# Patient Record
Sex: Female | Born: 1955 | Race: White | Hispanic: No | Smoking: Current every day smoker
Health system: Southern US, Community
[De-identification: ages and names within clinical notes are randomized; demographics above are authoritative.]

## PROBLEM LIST (undated history)

## (undated) DIAGNOSIS — E785 Hyperlipidemia, unspecified: Secondary | ICD-10-CM

## (undated) DIAGNOSIS — K219 Gastro-esophageal reflux disease without esophagitis: Secondary | ICD-10-CM

## (undated) DIAGNOSIS — N189 Chronic kidney disease, unspecified: Secondary | ICD-10-CM

## (undated) DIAGNOSIS — R519 Headache, unspecified: Secondary | ICD-10-CM

## (undated) DIAGNOSIS — T4145XA Adverse effect of unspecified anesthetic, initial encounter: Secondary | ICD-10-CM

## (undated) DIAGNOSIS — T8859XA Other complications of anesthesia, initial encounter: Secondary | ICD-10-CM

## (undated) DIAGNOSIS — I209 Angina pectoris, unspecified: Secondary | ICD-10-CM

## (undated) DIAGNOSIS — Z87442 Personal history of urinary calculi: Secondary | ICD-10-CM

## (undated) DIAGNOSIS — D649 Anemia, unspecified: Secondary | ICD-10-CM

## (undated) DIAGNOSIS — F32A Depression, unspecified: Secondary | ICD-10-CM

## (undated) DIAGNOSIS — R011 Cardiac murmur, unspecified: Secondary | ICD-10-CM

## (undated) DIAGNOSIS — J40 Bronchitis, not specified as acute or chronic: Secondary | ICD-10-CM

## (undated) DIAGNOSIS — R0600 Dyspnea, unspecified: Secondary | ICD-10-CM

## (undated) DIAGNOSIS — G4730 Sleep apnea, unspecified: Secondary | ICD-10-CM

## (undated) DIAGNOSIS — J449 Chronic obstructive pulmonary disease, unspecified: Secondary | ICD-10-CM

## (undated) DIAGNOSIS — D369 Benign neoplasm, unspecified site: Secondary | ICD-10-CM

## (undated) DIAGNOSIS — I499 Cardiac arrhythmia, unspecified: Secondary | ICD-10-CM

## (undated) DIAGNOSIS — G839 Paralytic syndrome, unspecified: Secondary | ICD-10-CM

## (undated) DIAGNOSIS — F419 Anxiety disorder, unspecified: Secondary | ICD-10-CM

## (undated) DIAGNOSIS — M797 Fibromyalgia: Secondary | ICD-10-CM

## (undated) DIAGNOSIS — M1990 Unspecified osteoarthritis, unspecified site: Secondary | ICD-10-CM

## (undated) DIAGNOSIS — M8580 Other specified disorders of bone density and structure, unspecified site: Secondary | ICD-10-CM

## (undated) DIAGNOSIS — M810 Age-related osteoporosis without current pathological fracture: Secondary | ICD-10-CM

## (undated) DIAGNOSIS — K2270 Barrett's esophagus without dysplasia: Secondary | ICD-10-CM

## (undated) DIAGNOSIS — E039 Hypothyroidism, unspecified: Secondary | ICD-10-CM

## (undated) DIAGNOSIS — I10 Essential (primary) hypertension: Secondary | ICD-10-CM

## (undated) DIAGNOSIS — J189 Pneumonia, unspecified organism: Secondary | ICD-10-CM

## (undated) HISTORY — DX: Hyperlipidemia, unspecified: E78.5

## (undated) HISTORY — DX: Other specified disorders of bone density and structure, unspecified site: M85.80

## (undated) HISTORY — DX: Morbid (severe) obesity due to excess calories: E66.01

## (undated) HISTORY — PX: KNEE ARTHROPLASTY: SHX992

## (undated) HISTORY — PX: NASAL SINUS SURGERY: SHX719

## (undated) HISTORY — PX: ESOPHAGOGASTRODUODENOSCOPY: SHX1529

## (undated) HISTORY — PX: TRANSTHORACIC ECHOCARDIOGRAM: SHX275

## (undated) HISTORY — PX: THYROIDECTOMY: SHX17

## (undated) HISTORY — PX: JOINT REPLACEMENT: SHX530

## (undated) HISTORY — PX: BACK SURGERY: SHX140

## (undated) HISTORY — PX: TONSILLECTOMY: SUR1361

## (undated) HISTORY — PX: COLONOSCOPY: SHX174

## (undated) HISTORY — PX: LAPAROSCOPIC ROUX-EN-Y GASTRIC BYPASS WITH UPPER ENDOSCOPY AND REMOVAL OF LAP BAND: SHX6505

## (undated) HISTORY — PX: DILATION AND CURETTAGE OF UTERUS: SHX78

## (undated) HISTORY — PX: ROTATOR CUFF REPAIR: SHX139

## (undated) HISTORY — DX: Age-related osteoporosis without current pathological fracture: M81.0

---

## 1997-10-18 ENCOUNTER — Other Ambulatory Visit: Admission: RE | Admit: 1997-10-18 | Discharge: 1997-10-18 | Payer: Self-pay | Admitting: Obstetrics and Gynecology

## 1998-11-27 ENCOUNTER — Inpatient Hospital Stay (HOSPITAL_COMMUNITY): Admission: AD | Admit: 1998-11-27 | Discharge: 1998-11-27 | Payer: Self-pay | Admitting: Obstetrics and Gynecology

## 1999-01-17 ENCOUNTER — Observation Stay (HOSPITAL_COMMUNITY): Admission: AD | Admit: 1999-01-17 | Discharge: 1999-01-18 | Payer: Self-pay | Admitting: Obstetrics and Gynecology

## 1999-01-27 ENCOUNTER — Inpatient Hospital Stay (HOSPITAL_COMMUNITY): Admission: AD | Admit: 1999-01-27 | Discharge: 1999-01-30 | Payer: Self-pay | Admitting: Obstetrics & Gynecology

## 1999-02-21 ENCOUNTER — Other Ambulatory Visit: Admission: RE | Admit: 1999-02-21 | Discharge: 1999-02-21 | Payer: Self-pay | Admitting: Obstetrics and Gynecology

## 2000-01-30 ENCOUNTER — Other Ambulatory Visit: Admission: RE | Admit: 2000-01-30 | Discharge: 2000-01-30 | Payer: Self-pay | Admitting: Obstetrics and Gynecology

## 2000-03-29 ENCOUNTER — Encounter: Admission: RE | Admit: 2000-03-29 | Discharge: 2000-03-29 | Payer: Self-pay | Admitting: Orthopedic Surgery

## 2000-03-29 ENCOUNTER — Encounter: Payer: Self-pay | Admitting: Orthopedic Surgery

## 2001-02-06 ENCOUNTER — Other Ambulatory Visit: Admission: RE | Admit: 2001-02-06 | Discharge: 2001-02-06 | Payer: Self-pay | Admitting: Obstetrics and Gynecology

## 2001-03-09 ENCOUNTER — Encounter: Payer: Self-pay | Admitting: Emergency Medicine

## 2001-03-09 ENCOUNTER — Emergency Department (HOSPITAL_COMMUNITY): Admission: EM | Admit: 2001-03-09 | Discharge: 2001-03-09 | Payer: Self-pay | Admitting: Emergency Medicine

## 2002-02-10 ENCOUNTER — Other Ambulatory Visit: Admission: RE | Admit: 2002-02-10 | Discharge: 2002-02-10 | Payer: Self-pay | Admitting: Obstetrics and Gynecology

## 2003-04-17 ENCOUNTER — Ambulatory Visit (HOSPITAL_BASED_OUTPATIENT_CLINIC_OR_DEPARTMENT_OTHER): Admission: RE | Admit: 2003-04-17 | Discharge: 2003-04-17 | Payer: Self-pay | Admitting: Emergency Medicine

## 2003-04-30 ENCOUNTER — Other Ambulatory Visit: Admission: RE | Admit: 2003-04-30 | Discharge: 2003-04-30 | Payer: Self-pay | Admitting: Obstetrics and Gynecology

## 2003-09-08 ENCOUNTER — Encounter (INDEPENDENT_AMBULATORY_CARE_PROVIDER_SITE_OTHER): Payer: Self-pay | Admitting: *Deleted

## 2003-09-08 ENCOUNTER — Ambulatory Visit (HOSPITAL_COMMUNITY): Admission: RE | Admit: 2003-09-08 | Discharge: 2003-09-08 | Payer: Self-pay | Admitting: *Deleted

## 2003-09-16 DIAGNOSIS — B0229 Other postherpetic nervous system involvement: Secondary | ICD-10-CM | POA: Insufficient documentation

## 2003-11-08 ENCOUNTER — Inpatient Hospital Stay (HOSPITAL_COMMUNITY): Admission: RE | Admit: 2003-11-08 | Discharge: 2003-11-11 | Payer: Self-pay | Admitting: Orthopedic Surgery

## 2004-10-23 ENCOUNTER — Encounter: Admission: RE | Admit: 2004-10-23 | Discharge: 2004-10-23 | Payer: Self-pay | Admitting: Neurosurgery

## 2004-11-06 ENCOUNTER — Encounter: Admission: RE | Admit: 2004-11-06 | Discharge: 2004-11-06 | Payer: Self-pay | Admitting: Neurosurgery

## 2005-05-23 ENCOUNTER — Ambulatory Visit: Payer: Self-pay | Admitting: Licensed Clinical Social Worker

## 2005-05-30 ENCOUNTER — Ambulatory Visit: Payer: Self-pay | Admitting: Licensed Clinical Social Worker

## 2005-06-06 ENCOUNTER — Ambulatory Visit: Payer: Self-pay | Admitting: Licensed Clinical Social Worker

## 2005-06-19 ENCOUNTER — Ambulatory Visit: Payer: Self-pay | Admitting: Licensed Clinical Social Worker

## 2005-06-27 ENCOUNTER — Ambulatory Visit: Payer: Self-pay | Admitting: Licensed Clinical Social Worker

## 2005-07-04 ENCOUNTER — Ambulatory Visit: Payer: Self-pay | Admitting: Licensed Clinical Social Worker

## 2005-07-11 ENCOUNTER — Ambulatory Visit: Payer: Self-pay | Admitting: Licensed Clinical Social Worker

## 2005-07-18 ENCOUNTER — Ambulatory Visit: Payer: Self-pay | Admitting: Licensed Clinical Social Worker

## 2005-07-25 ENCOUNTER — Ambulatory Visit: Payer: Self-pay | Admitting: Licensed Clinical Social Worker

## 2005-08-01 ENCOUNTER — Ambulatory Visit: Payer: Self-pay | Admitting: Licensed Clinical Social Worker

## 2005-08-08 ENCOUNTER — Ambulatory Visit: Payer: Self-pay | Admitting: Licensed Clinical Social Worker

## 2005-08-22 ENCOUNTER — Ambulatory Visit: Payer: Self-pay | Admitting: Licensed Clinical Social Worker

## 2006-06-26 ENCOUNTER — Encounter: Admission: RE | Admit: 2006-06-26 | Discharge: 2006-06-26 | Payer: Self-pay | Admitting: Endocrinology

## 2006-07-10 ENCOUNTER — Encounter (INDEPENDENT_AMBULATORY_CARE_PROVIDER_SITE_OTHER): Payer: Self-pay | Admitting: Specialist

## 2006-07-10 ENCOUNTER — Encounter: Admission: RE | Admit: 2006-07-10 | Discharge: 2006-07-10 | Payer: Self-pay | Admitting: Otolaryngology

## 2006-07-10 ENCOUNTER — Other Ambulatory Visit: Admission: RE | Admit: 2006-07-10 | Discharge: 2006-07-10 | Payer: Self-pay | Admitting: Interventional Radiology

## 2006-09-19 ENCOUNTER — Encounter (INDEPENDENT_AMBULATORY_CARE_PROVIDER_SITE_OTHER): Payer: Self-pay | Admitting: Specialist

## 2006-09-19 ENCOUNTER — Ambulatory Visit (HOSPITAL_COMMUNITY): Admission: RE | Admit: 2006-09-19 | Discharge: 2006-09-20 | Payer: Self-pay | Admitting: Otolaryngology

## 2006-12-28 ENCOUNTER — Emergency Department (HOSPITAL_COMMUNITY): Admission: EM | Admit: 2006-12-28 | Discharge: 2006-12-28 | Payer: Self-pay | Admitting: Emergency Medicine

## 2007-03-27 ENCOUNTER — Encounter: Admission: RE | Admit: 2007-03-27 | Discharge: 2007-03-27 | Payer: Self-pay | Admitting: Obstetrics and Gynecology

## 2007-04-01 ENCOUNTER — Encounter: Admission: RE | Admit: 2007-04-01 | Discharge: 2007-04-01 | Payer: Self-pay | Admitting: Interventional Radiology

## 2007-04-15 HISTORY — PX: CARDIOVASCULAR STRESS TEST: SHX262

## 2007-04-17 ENCOUNTER — Ambulatory Visit (HOSPITAL_COMMUNITY): Admission: RE | Admit: 2007-04-17 | Discharge: 2007-04-18 | Payer: Self-pay | Admitting: Interventional Radiology

## 2007-04-20 ENCOUNTER — Encounter: Payer: Self-pay | Admitting: Emergency Medicine

## 2007-05-13 ENCOUNTER — Encounter: Admission: RE | Admit: 2007-05-13 | Discharge: 2007-05-13 | Payer: Self-pay | Admitting: Interventional Radiology

## 2007-11-03 ENCOUNTER — Inpatient Hospital Stay (HOSPITAL_COMMUNITY): Admission: RE | Admit: 2007-11-03 | Discharge: 2007-11-06 | Payer: Self-pay | Admitting: Neurosurgery

## 2009-02-18 ENCOUNTER — Encounter: Admission: RE | Admit: 2009-02-18 | Discharge: 2009-02-18 | Payer: Self-pay | Admitting: Emergency Medicine

## 2009-02-19 ENCOUNTER — Ambulatory Visit (HOSPITAL_COMMUNITY): Admission: RE | Admit: 2009-02-19 | Discharge: 2009-02-19 | Payer: Self-pay | Admitting: Emergency Medicine

## 2009-05-02 ENCOUNTER — Encounter: Admission: RE | Admit: 2009-05-02 | Discharge: 2009-05-02 | Payer: Self-pay | Admitting: Gastroenterology

## 2010-03-05 ENCOUNTER — Ambulatory Visit: Payer: Self-pay | Admitting: Diagnostic Radiology

## 2010-03-05 ENCOUNTER — Ambulatory Visit (HOSPITAL_BASED_OUTPATIENT_CLINIC_OR_DEPARTMENT_OTHER): Admission: RE | Admit: 2010-03-05 | Discharge: 2010-03-05 | Payer: Self-pay | Admitting: Emergency Medicine

## 2010-04-18 DIAGNOSIS — I209 Angina pectoris, unspecified: Secondary | ICD-10-CM

## 2010-04-18 HISTORY — DX: Angina pectoris, unspecified: I20.9

## 2010-05-02 ENCOUNTER — Observation Stay (HOSPITAL_COMMUNITY)
Admission: EM | Admit: 2010-05-02 | Discharge: 2010-05-03 | Payer: Self-pay | Source: Home / Self Care | Admitting: Emergency Medicine

## 2010-05-03 HISTORY — PX: CARDIAC CATHETERIZATION: SHX172

## 2010-07-08 ENCOUNTER — Encounter: Payer: Self-pay | Admitting: Urology

## 2010-07-09 ENCOUNTER — Encounter: Payer: Self-pay | Admitting: Interventional Radiology

## 2010-07-09 ENCOUNTER — Encounter: Payer: Self-pay | Admitting: Emergency Medicine

## 2010-07-10 ENCOUNTER — Encounter: Payer: Self-pay | Admitting: Emergency Medicine

## 2010-08-29 LAB — CARDIAC PANEL(CRET KIN+CKTOT+MB+TROPI)
CK, MB: 1 ng/mL (ref 0.3–4.0)
Relative Index: INVALID (ref 0.0–2.5)
Total CK: 92 U/L (ref 7–177)
Troponin I: <0.01 ng/mL (ref 0.00–0.06)

## 2010-08-29 LAB — POCT CARDIAC MARKERS
CKMB, poc: <1 ng/mL — ABNORMAL LOW (ref 1.0–8.0)
Myoglobin, poc: 46.5 ng/mL (ref 12–200)
Myoglobin, poc: 48.4 ng/mL (ref 12–200)

## 2010-08-29 LAB — DIFFERENTIAL
Basophils Absolute: 0 10*3/uL (ref 0.0–0.1)
Basophils Relative: 0 % (ref 0–1)
Lymphocytes Relative: 26 % (ref 12–46)
Monocytes Absolute: 0.6 10*3/uL (ref 0.1–1.0)
Neutro Abs: 6.9 10*3/uL (ref 1.7–7.7)

## 2010-08-29 LAB — BRAIN NATRIURETIC PEPTIDE: Pro B Natriuretic peptide (BNP): <30 pg/mL (ref 0.0–100.0)

## 2010-08-29 LAB — TSH: TSH: 1.716 u[IU]/mL (ref 0.350–4.500)

## 2010-08-29 LAB — CBC
Hemoglobin: 12.9 g/dL (ref 12.0–15.0)
MCHC: 32.4 g/dL (ref 30.0–36.0)
Platelets: 351 10*3/uL (ref 150–400)
RBC: 4.61 MIL/uL (ref 3.87–5.11)
RDW: 14.5 % (ref 11.5–15.5)

## 2010-08-29 LAB — BASIC METABOLIC PANEL
Chloride: 99 mEq/L (ref 96–112)
Creatinine, Ser: 1.03 mg/dL (ref 0.4–1.2)
GFR calc Af Amer: >60 mL/min (ref >60)
GFR calc non Af Amer: 56 mL/min — ABNORMAL LOW (ref >60)
Potassium: 3.5 mEq/L (ref 3.5–5.1)
Sodium: 137 mEq/L (ref 135–145)

## 2010-08-29 LAB — HEMOGLOBIN A1C
Hgb A1c MFr Bld: 6.2 % — ABNORMAL HIGH (ref <5.7)
Mean Plasma Glucose: 131 mg/dL — ABNORMAL HIGH (ref <117)

## 2010-08-29 LAB — CK TOTAL AND CKMB (NOT AT ARMC): Total CK: 103 U/L (ref 7–177)

## 2010-08-29 LAB — HEPARIN LEVEL (UNFRACTIONATED): Heparin Unfractionated: <0.1 IU/mL — ABNORMAL LOW (ref 0.30–0.70)

## 2010-10-31 NOTE — Op Note (Signed)
NAME:  Michaela Raymond, Michaela Raymond NO.:  [id number]   MEDICAL RECORD NO.:  [id number]          PATIENT TYPE:  INP   LOCATION:  3008                         FACILITY:  [facility]   PHYSICIAN:  Cristi Loron, M.D.DATE OF BIRTH:  [birth year]   DATE OF PROCEDURE:  11/03/2007  DATE OF DISCHARGE:                               OPERATIVE REPORT   BRIEF HISTORY:  The patient is a 55 year old white female who has  suffered from back and leg pain.  She failed medical management worked  up with a lumbar MRI and x-rays, which demonstrated the patient has  spondylolisthesis with facet arthropathies ,ddd at L4-L5 and L5-S1.  She  failed extensive nonsurgical management.  I discussed various treatment  options with her including the surgery.  The patient has weighed the  risks, benefits, and alternatives of surgery, and decided to proceed  with a L4-L5, L5-S1 decompression, instrumentation, and fusion.   PREOPERATIVE DIAGNOSES:  1. L4-L5 and L5-S1 disk degeneration.  2. Facet arthropathy.  3. spondylolisthesis.  4. Lumbago.  5. Lumbar radiculopathy/myelopathy.   POSTOPERATIVE DIAGNOSES:  1. L4-L5 and L5-S1 disk degeneration.  2. Facet arthropathy.  3. spondylolisthesis.  4. Lumbago.  5. Lumbar radiculopathy/myelopathy.   PROCEDURE:  Bilateral L4-L5 laminotomies and foraminotomies to  decompress the bilateral L4-L5 and S1 nerve roots; L4-L5 and L5-S1  transforaminal lumbar interbody fusion with local morselized autograft  bone, Actifuse, and Vitoss bone graft extender; insertion of L4-L5 and  L5-S1 interbody prosthesis (Capstone PEEK interbody prosthesis);  posterior segmental instrumentation at L4-S1 with Legacy titanium  pedicle screws and rods; posterolateral arthrodesis at L4-L5 and L5-S1  with local morselized autograft bone and Vitoss bone graft extender as  well as Actifuse bone graft extender.   SURGEON:  Cristi Loron, MD   ASSISTANT:  Hilda Lias, MD   ANESTHESIA:  General endotracheal.   ESTIMATED BLOOD LOSS:  500 mL.   SPECIMENS:  None.   DRAINS:  None.   COMPLICATIONS:  None.   DESCRIPTION OF PROCEDURE:  The patient was brought to the operating room  by anesthesia team.  General endotracheal anesthesia was induced.  The  patient was turned to the prone position on Wilson frame.  The  lumbosacral region was then prepared with Betadine scrub and Betadine  solution.  Sterile drapes were applied.  I then injected the area to be  incised with Marcaine with epinephrine solution and using scalpel to  make a linear midline incision over the L4-L5 and L5-S1 interspace.  I  used electrocautery and performed a bilateral subperiosteal dissection  exposing the spinous process of lamina of L3, L4, and L5 in the upper  sacrum.  We obtained intraoperative radiograph to confirm our location.  We inserted the Versa-Trac retractor for exposure.   We began the decompression by performing bilateral L4-L5 laminotomies.  We widened the laminotomies with Kerrison punches and removed the L4-L5  and L5-S1 ligamentum flavum.  We then removed the medial aspect of the  L4-L5 and L5-S1 of facet joints to decompress the neural elements.  Of  note, this patient required a greater decompression  that would have been  required to do a lumbar interbody fusion secondary to the severe facet  arthropathy bilaterally.  We performed a foraminotomy at bilateral L4-L5  and S1 nerve roots and this completed the decompression.   We now turned attention to the arthrodesis.  We incised the L4-L5 and L5-  S1 intervertebral disk spaces on the left.  We then performed a partial  intervertebral diskectomy using a pituitary forceps and then curettes.  We prepared the vertebral endplates for the lumbar interbody fusion by  clearing the soft tissue using the curettes.  We then used trial spacers  and determined to use a 14 x 26-mm prosthesis at L4-L5, a 12 x 26 at L5-  S1.  We  prefilled this prosthesis with a combination of local autograft  bone.  We obtained the decompression by Actifuse and Vitoss bone graft  extender.  We used this substance to fill eventually the disk spaces  well.  We then inserted the prosthesis into the interspaces of course  after retracting neural structures out of harm's way.  We then filled  posterior to prosthesis using local autograft bone and Vitoss and  Actifuse completing the transforaminal lumbar interbody fusion.   We now turned attention to the instrumentation.  Under fluoroscopic  guidance, we cannulated the bilateral L4-L5-S1 pedicles using the bone  probe.  We tapped the pedicles with a 5.5-mm tap at L4-L5 bilaterally  and a 6.5-mm bilaterally at S1.  We probed inside the tap pedicles to  right cortical breaches.  We then inserted 6.5 x 50-mm pedicle screws  bilaterally at L4-L5 and 7.5 x 45 bilaterally at S1.  We did this under  fluoroscopic guidance.  We then palpated along the medial aspect of  bilateral L4-L5-S1 nerve roots and ruled out cortical breaches and noted  that the nerve roots were not injured.  We then connected unilateral  pedicle screws of a lordotic rod.  We compressed the construct, secured  the rod in place with caps, which we tightened appropriately.  This  completed the instrumentation.   We now turned attention to the posterolateral arthrodesis.  We used a  high-speed drill to decorticate the remainder of the L4-L5 and L5-S1  facets, pars regions and transverse processes.  We then laid a  combination of local morselized autograft bone and Vitoss bone graft  extender and Actifuse bone graft extender over these decorticated  posterolateral structures and this completed the posterolateral  arthrodesis.  We then inspected the thecal sac and about L4, L5, and S1  nerve roots and noted they were well decompressed.  We obtained  hemostasis using bipolar electrocautery.  We irrigated the wound out  with  bacitracin solution.  We then removed the retractor and  reapproximated the patient's thoracolumbar fascia with interrupted #1  Vicryl suture, subcutaneous tissue with interrupted 2-0 Vicryl suture,  and the skin with Steri-Strips and benzoin.  The wound was then covered  with  bacitracin ointment.  A sterile dressing was applied.  The drapes were  removed.  The patient was subsequently returned to the supine position  where she was extubated by the anesthesia team and transported to the  postanesthesia care unit in stable condition.  All sponge, instrument,  and needle counts were correct at the end of this case.      Cristi Loron, M.D.  Electronically Signed     JDJ/MEDQ  D:  11/03/2007  T:  11/04/2007  Job:  [id number]

## 2010-10-31 NOTE — Discharge Summary (Signed)
NAME:  Michaela Raymond, Michaela Raymond               ACCOUNT NO.:  [id number]   MEDICAL RECORD NO.:  [id number]          PATIENT TYPE:  INP   LOCATION:  3008                         FACILITY:  [facility]   PHYSICIAN:  Hilda Lias, M.D.   DATE OF BIRTH:  1955-10-09   DATE OF ADMISSION:  11/03/2007  DATE OF DISCHARGE:  11/06/2007                               DISCHARGE SUMMARY   ADMISSION DIAGNOSIS:  Degenerative disk disease, L4-L5, L5-S1 with a  chronic back pain.   FINAL DIAGNOSIS:  Degenerative disk disease, L4-L5, L5-S1 with a chronic  back pain.   CLINICAL HISTORY:  The patient was admitted by Dr. Lovell Sheehan because of  back pain radiating to both legs.  X-ray showed degenerative disk  disease at L4-L5, L5-S1.  Surgery was advised.  Laboratory was normal.   COURSE OF THE HOSPITAL:  The patient was taken to surgery.  L4-5, L5-S1  fusion was done today.  She is ambulating, has minimal discomfort, and  she is ready to go home.   CONDITION ON DISCHARGE:  Improved.   MEDICATIONS:  Diazepam and Percocet.   DIET:  Regular.   ACTIVITY:  Not to drive and not to bend.   FOLLOWUP:  She will be calling Dr. Lovell Sheehan for a followup appointment.           ______________________________  Hilda Lias, M.D.     EB/MEDQ  D:  11/06/2007  T:  11/06/2007  Job:  [id number]

## 2010-11-03 NOTE — Op Note (Signed)
NAME:  Michaela Raymond, Michaela Raymond               ACCOUNT NO.:  [id number]   MEDICAL RECORD NO.:  [id number]          PATIENT TYPE:  AMB   LOCATION:  SDS                          FACILITY:  [facility]   PHYSICIAN:  Suzanna Obey, M.D.       DATE OF BIRTH:  07-03-1955   DATE OF PROCEDURE:  09/19/2006  DATE OF DISCHARGE:                               OPERATIVE REPORT   PREOPERATIVE DIAGNOSES:  Left thyroid mass.   POSTOPERATIVE DIAGNOSES:  Left thyroid mass.   SURGICAL PROCEDURES:  Left thyroidectomy.   ANESTHESIA:  General.   ESTIMATED BLOOD LOSS:  Approximately 10 mL.   INDICATIONS:  This is a 55 year old who has had a left thyroid mass that  has been evaluated by ultrasound.  It was initially found with an MRI  scan incidentally.  She has had a fine-needle aspiration which was  consistent with a thyroid adenoma.  She wants to have the lesion  removed.  She was informed of risk and benefits of procedure including  bleeding, infection, hoarseness, dysphagia, calcium issues, need for  additional surgery, scar, and risk of the anesthetic.  All questions  were answered and consent was obtained.   OPERATION:  The patient was taken to the operating room, placed in the  supine position.  After adequate general endotracheal tube anesthesia  was prepped and draped in usual sterile manner.  An incision was made at  the base of her neck just above the clavicles and dissected down to the  strap muscles.  A flap was elevated superiorly and inferiorly and the  diastasis of strap muscle was divided.  The left side was begun by  removing the strap muscles off the thyroid gland which was easily  identified.  Immediately a very large mass was identified.  The  dissection was carried right along the capsule of the mass which was  very irregular appearing and also had a goiterous type look.  The mass  came out almost with just a small portion of the thyroid gland shelled  out and was just attached to a small component  of the remaining superior  thyroid gland.  Because of this it was just divided off and sent for  frozen section.  It came back a follicular lesion.  The dissection along  the capsule had never really extended down into the tracheoesophageal  groove but taking a peak with a hemostat the nerve was identified and  was intact and not disturbed.  The wound was irrigated.  The #7 JP drain  was placed and the strap muscles were closed with interrupted 4-0  chromic.  The subcutaneous closed with interrupted 4-0 chromic in a  running 5-0 nylon to close the skin.  This drain was secured a 5-0  nylon.  The patient was awakened, brought to recovery in stable  condition.  Counts correct.           ______________________________  Suzanna Obey, M.D.     JB/MEDQ  D:  09/19/2006  T:  09/19/2006  Job:  [id number]   cc:   Brett Canales A. Cleta Alberts, M.D.

## 2010-11-03 NOTE — Discharge Summary (Signed)
NAME:  Michaela Raymond, Michaela Raymond                         ACCOUNT NO.:  [id number]   MEDICAL RECORD NO.:  [id number]                   PATIENT TYPE:  INP   LOCATION:  0467                                 FACILITY:  [facility]   PHYSICIAN:  John L. Rendall, M.D.               DATE OF BIRTH:  29-Feb-1956   DATE OF ADMISSION:  11/08/2003  DATE OF DISCHARGE:  11/11/2003                                 DISCHARGE SUMMARY   ADMITTING DIAGNOSIS:  1. Severe osteoarthritis of the knee.  2. Hypertension.  3. Thyroid disease.  4. Sleep apnea.  5. Reflux disease.  6. Depression.  7. Herpes simplex virus 2, recurrent.   DISCHARGE DIAGNOSES:  1. Status post left total knee.  2. Loose stool.  3. Hypertension.  4. Thyroid disease.  5. Sleep apnea.  6. Reflux disease.  7. Depression.  8. Herpes simplex virus.  9. Tobacco abuse.   HISTORY OF PRESENT ILLNESS:  Michaela Raymond is a 55 year old white female with  left knee pain since her early 20's.  The patient has undergone multiple  knee arthroscopies of the left knee that give her minimal improvement,  lasting no longer than a year.  The patient notes that since being unable to  take Vioxx that her pain has returned.  She describes the pain as sharp  burning pain in the medial aspect of her knee with any weightbearing steps  or standing for long periods of time.  The pain is significant at night.  She does have popping, grinding, and swelling of the knee.  X-rays of the  left knee reveal near bone-on-bone in the medial compartment.   ALLERGIES:  1. TALWIN.  2. CODEINE.  3. TYLOX.  4. ZITHROMAX.  5. SULFA.  6. Increased agitation with VERSED.   CURRENT MEDICATIONS:  1. Synthroid 100 mcg p.o. daily.  2. Avapro 300 mg p.o. daily.  3. Thiazide 37.5, 25 mg p.o. daily.  4. Protonix 40 mg p.o. daily.  5. Elavil 25 mg p.o. q.h.s.  6. Valtrex 500 mg p.o. q.h.s.  7. Singulair 10 mg p.o. daily.  8. Nasacort one spray q naris daily.   SURGICAL PROCEDURE:   The patient was taken to the operating room on Nov 08, 2003 by Dr. Erasmo Leventhal, assisted by Richardean Canal, P.A.  The patient was  placed under general anesthesia, and a left total knee arthroplasty was  performed.  The following components were used - a 2.5 tibial tray,  __________ 15 mm bearing, and a standard patella.  The patient tolerated the  procedure well and returned to recovery in good stable condition.   CONSULTS:  The following consults were obtained while the patient was  hospitalized -  1. PT.  2. OT.  3. Case management.   HOSPITAL COURSE:  The patient did develop some itching postoperatively;  however, this was controlled using Benadryl.  The patient did develop loose  stool on postoperative day #3.  A Clostridium difficile was ordered;  however, there is no record of the results.  Otherwise, the patient's vital  signs remained stable throughout the hospital stay.  T-max of 100.2.  At the  time of discharge, the patient's temperature was 99.9, vital signs were  stable, hemoglobin was 11, hematocrit 33.3.  The patient was discharged home  on postoperative day #3 in good stable condition.   LABORATORY DATA:  The following labs were obtained preoperatively.  CBC for  which all values were within normal limits.  Coags for which all values were  within normal limits.  Routine chemistries were also obtained, and all  values, except for the glucose which was 108, were within normal limits.  Hepatic enzymes on admission were within normal limits.  HIV test was  nonreactive.  Urinalysis was negative.   EKG performed on Nov 08, 2003 showed normal sinus rhythm with a heart rate  of 67 beats per minute, PR interval was 156 milliseconds, PRT axis at 33, -  13, 35.   X-RAYS:  Chest x-ray performed on Nov 03, 2003 showed no evidence of acute  abnormality.  The lungs were free of focal consolidation and pleural  effusions.   DISCHARGE INSTRUCTIONS:   MEDICATIONS:  1. The  patient is to resume preoperative medications.  No Darvocet.  Will     take Percocet.  Will take OxyContin.  The following meds were added -  1. Arixtra 2.5 mg subcutaneously once a day q.a.m. x4 days, with the last     dose to be on Monday.  2. OxyContin 10 mg sustained release one tablet q.12h., #20, with no     refills.  3. Percocet 5/325 mg 1-2 tablets q.4-6h. as needed for pain.   ACTIVITY:  Weightbearing as tolerated.   DIET:  No restrictions.   WOUND CARE:  The patient is to keep the wound clean and dry.  Change  dressing daily.  Call office if there are any signs of infection.  CPM 0-90  degrees 6-8 hours a day, increasing by 10 degrees daily.   FOLLOW UP:  The patient needs follow up with Dr. Priscille Kluver in the office in 10-  12 days from discharge.  The patient is to call the office at [phone] to  make an appointment.   DISPOSITION:  The patient was discharged home in good stable condition.  The  patient had home health to be performed by [place].     Richardean Canal, P.A.                       John L. Priscille Kluver, M.D.    GC/MEDQ  D:  11/29/2003  T:  11/29/2003  Job:  [id number]

## 2010-11-03 NOTE — Op Note (Signed)
NAME:  Michaela Raymond, Michaela Raymond                         ACCOUNT NO.:  [id number]   MEDICAL RECORD NO.:  [id number]                   PATIENT TYPE:  INP   LOCATION:  X006                                 FACILITY:  [facility]   PHYSICIAN:  John L. Rendall III, M.D.           DATE OF BIRTH:  1956/04/28   DATE OF PROCEDURE:  11/08/2003  DATE OF DISCHARGE:                                 OPERATIVE REPORT   PREOPERATIVE DIAGNOSES:  End-stage osteoarthritis, left knee.   POSTOPERATIVE DIAGNOSES:  End-stage osteoarthritis, left knee.   PROCEDURE:  Left LCS total knee replacement.   SURGEON:  John L. Rendall, M.D.   ASSISTANT:  Richardean Canal, P.A.   ANESTHESIA:  General.   PATHOLOGY:  The patient has bone against bone medial compartment and  patellofemoral articulation left knee with unremitting pain despite all  conservative measures.   DESCRIPTION OF PROCEDURE:  Under general anesthesia, the left leg was  prepared with Duraprep and draped as a sterile field. The leg was wrapped  out with an esmarch and a sterile tourniquet is elevated at 350 mm. A  midline incision is made, the patella is everted, the femur is sized to a  standard. Using the tibial guide, a proximal tibial resection is carried  out, cruciate's are not preserved. Using first a femoral guide, an  intercondylar drill hole is placed. Using the second femoral guide, anterior  and posterior flare of the distal femur are resected with a 12.5 flexion  gap. The intramedullary guide is then used and after several end cuts, a  12.5 extension is obtained. A recessing guide is then used. The laminar  spreader is then inserted, remnants of the cruciate's and menisci and spurs  off the back of the femur are removed. Attention is then turned to the tibia  which is sized at a #2.5. A center peg hole is placed, trial fitting of a  2.5 tibia, 12.5 bearing and a standard femur reveal good fit, alignment, and  stability but some laxity in flexion. A  15 mm bearing is then inserted and  this gives good fit and alignment. The patella is osteotomized, three peg  hole patella is inserted. Following this, permanent components are obtained,  bony surfaces are prepared with pulse irrigation and all components are  cemented in place.  Tourniquet time is 48 minutes once cement is hardened.  At this point, the tourniquet is let down, multiple vessels are cauterized,  the wound is then closed in layers of #1 Tycron, 0 and 2-0 Vicryl, and skin  clips. The patient tolerated the procedure well and returned to recovery in  good condition. The patient has excellent stability to varus, valgus and  drawer testing at the end of the case.  John L. Dorothyann Gibbs, M.D.    Renato Gails  D:  11/08/2003  T:  11/08/2003  Job:  [id number]

## 2010-11-03 NOTE — H&P (Signed)
NAME:  Michaela Raymond, Michaela Raymond NO.:  [id number]   MEDICAL RECORD NO.:  [id number]                   PATIENT TYPE:   LOCATION:                                       FACILITY:   PHYSICIAN:  John L. Rendall, M.D.               DATE OF BIRTH:  1956/02/17   DATE OF ADMISSION:  11/08/2003  DATE OF DISCHARGE:                                HISTORY & PHYSICAL   CHIEF COMPLAINT:  Left knee pain.   HISTORY OF PRESENT ILLNESS:  The patient is a 55 year old white female with  problems with her left knee since the patient's early 20's.  The patient has  had multiple arthroscopic evaluations and procedures with short-term  improvement but over this last 1 year, she has noted the pain return since  she has been denied Vioxx.  The patient states she has a sharp, burning pain  in the medial aspect of her knee with any weightbearing steps or standing  long periods of time.  She does have significant night pain.  She does have  popping, grinding, and swelling.  X-rays reveal near bone-on-bone medial  compartment.   ALLERGIES:  1. TALWIN.  2. CODEINE.  3. TYLOX.  4. ZITHROMAX.  5. SULFA.  6. The patient states she had an incident with increased agitation with     VERSED once.   CURRENT MEDICATIONS:  1. Synthroid 100 mcg p.o. daily.  2. Avapro 300 mg p.o. daily.  3. Diazide 37.5/25 p.o. daily.  4. Protonix 40 mg p.o. daily.  5. Elavil 25 mg p.o. q.h.s.  6. Valtrex 500 mg p.o. q.h.s.  7. Singulair 10 mg p.o. daily.  8. Nasocort nasal spray 1 spray q naris daily.   PRESENT MEDICAL HISTORY:  1. Hypertension.  2. Thyroid disease.  3. Sleep apnea.  4. Reflux disease.  5. Depression.  6. Recurrent HSV-2.   PAST SURGICAL HISTORY:  1. T&A in 1972.  2. Left knee arthroscopy in 1981.  3. Sinus surgery in 1990.  4. Left knee arthroscopy and Baker cyst removal in '98.  5. Carpal tunnel release in 2002.  6. The patient states her only issue with anesthesia in the past  is she had     some increased agitation after receiving some Versed once.   SOCIAL HISTORY:  The patient is a slightly heavyset 55 year old white  female, who has stopped smoking within the last 2 weeks.  She previously  smoked 1 pack a day for 20 years.  She denies any history of alcohol use.  She is married.  She has several children.  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   FAMILY PHYSICIAN:  Dr. Earl Lites, [phone].   FAMILY MEDICAL HISTORY:  Mother is alive at 76 with a history of cardiac and  vascular atherosclerosis, hypertension, and depression.  Father is alive at  70 with COPD, CHF,  CAD, and sleep apnea.  Two brothers, one with  hypertension and sleep apnea, the other in good health.  Two sisters, one  with renal disease and breast cancer, the other in good health.   REVIEW OF SYSTEMS:  Positive for iron deficiency anemia.  She does wear  glasses at all times.  She had a problem with reflux but it is well-  controlled on the Protonix.  She has chronic diarrhea which was evaluation  with an endoscopy and colonoscopy without any obvious etiology.  She has  occasional problems with increased urinary frequency.   PHYSICAL EXAMINATION:  VITAL SIGNS:  Height is 5 foot 4 inches.  Weight 220  pounds.  Blood pressure is 126/84. Pulse of 72 and regular, respirations 12.  The patient is afebrile.  GENERAL:  This is a healthy-appearing, slight heavy set white female.  She  walks with a slight left-sided limp.  She is able to get herself on and off  the exam table without any assistance.  HEENT:  Head is normocephalic.  Pupils equal, round, and reactive.  Extraocular movements intact.  Sclerae is not icteric.  Conjunctivae is pink  and moist.  External ear without deformities.  Gross hearing is intact.  Nasal septum is midline.  Oral buccal mucosa is pink and moist.  NECK:  Supple.  No palpable lymphadenopathy.  Thyroid region is nontender.  She has  good range of motion of her cervical spine without any difficulty or  tenderness.  CHEST:  Lung sounds are clear and equal bilaterally.  No wheezes, rales,  rhonchi, or rubs noted.  HEART:  Regular rate and rhythm, S1 and S2.  No murmurs or rubs noted.  ABDOMEN:  Round, obese.  She is nontender to deep palpation.  Bowel sounds  are normoactive.  No hepatosplenomegaly.  EXTREMITIES:  Upper extremities are symmetrically size and shape.  She has  good range of motion of her shoulders, elbows, and wrists.  Motor strength  is 5/5.  LOWER EXTREMITIES:  Right and left hip has full extension, flexion up to 130  degrees with 20 degrees internal/external rotation without any mechanical  symptoms or gross discomfort.  Right knee is without any signs of erythema  or ecchymosis.  No palpable effusion.  She is not tender along the joint  line.  She has full extension, flexion back to 120 degrees without any  difficulty or instability.  The calf is nontender.  The left knee has no  signs of erythema or ecchymosis.  It does have some well-healed arthroscopic  incisions.  There is some slight puffiness around these ports.  She is  slightly tender along the lateral joint.  She is moderately tender medially  along the medial tibial plateau region.  She opened up with about 5 degrees  varus valgus laxity.  She has full extension, flexion back to 115 degrees.  Calf is nontender.  Ankles are symmetrical with good dorsoplantar flexion.  PERIPHERAL VASCULATURE:  Carotid pulses are 2+, no bruits, radial pulses 2+,  dorsalis pedis not palpable, posterior tibial pulses are +.  She has no  lower extremity edema or venous stasis changes.  NEUROLOGIC:  The patient is conscious alert and appropriate, held normal  conversation with examiner.  Cranial nerves 2-12 are grossly intact.  She is  grossly intact to light touch sensation from head to toe.  She has no gross  neurologic defects noted. BREASTS/RECTAL/GU:   Deferred at this time.   IMPRESSION:  1. Severe osteoarthritis  of left knee.  2. Hypertension.  3. Thyroid disease.  4. Sleep apnea.  5. Reflux disease.  6. Depression.  7. Herpes simplex virus 2, recurrent.   PLAN:  The patient will undergo all routine labs and tests prior to having a  left total knee arthroplasty by Dr. Priscille Kluver at [facility] on May  23.  The patient did indicated she had a stress echocardiogram done in 2004.  We will attempt to get it from her primary care physician's office prior to  this procedure and forward it on to the hospital.     Jamelle Rushing, P.A.                      John L. Priscille Kluver, M.D.    RWK/MEDQ  D:  10/28/2003  T:  10/28/2003  Job:  [id number]

## 2011-03-14 LAB — BASIC METABOLIC PANEL
Calcium: 8.4
GFR calc Af Amer: >60
GFR calc non Af Amer: >60
Glucose, Bld: 128 — ABNORMAL HIGH
Sodium: 138

## 2011-03-14 LAB — CBC
Hemoglobin: 12
RBC: 3.97
RDW: 15.1

## 2011-03-14 LAB — TYPE AND SCREEN
ABO/RH(D): O NEG
Antibody Screen: NEGATIVE

## 2011-03-27 LAB — URINALYSIS, ROUTINE W REFLEX MICROSCOPIC
Hgb urine dipstick: NEGATIVE
Specific Gravity, Urine: 1.008
Urobilinogen, UA: 0.2

## 2011-03-27 LAB — DIFFERENTIAL
Eosinophils Absolute: 0.2
Lymphocytes Relative: 13
Lymphs Abs: 1.7
Monocytes Relative: 5
Neutro Abs: 10.3 — ABNORMAL HIGH
Neutrophils Relative %: 80 — ABNORMAL HIGH

## 2011-03-27 LAB — CBC
MCV: 83.3
RBC: 4.39
WBC: 12.9 — ABNORMAL HIGH

## 2011-03-28 LAB — CBC
HCT: 38
Hemoglobin: 12.6
MCHC: 33
MCV: 82.3
RBC: 4.62

## 2011-04-03 LAB — TSH: TSH: 3.759

## 2011-04-03 LAB — T4, FREE: Free T4: 1.28

## 2011-05-25 ENCOUNTER — Ambulatory Visit (INDEPENDENT_AMBULATORY_CARE_PROVIDER_SITE_OTHER): Payer: BC Managed Care – PPO

## 2011-05-25 DIAGNOSIS — Z23 Encounter for immunization: Secondary | ICD-10-CM

## 2011-06-02 ENCOUNTER — Ambulatory Visit (INDEPENDENT_AMBULATORY_CARE_PROVIDER_SITE_OTHER): Payer: BC Managed Care – PPO

## 2011-06-02 DIAGNOSIS — I2510 Atherosclerotic heart disease of native coronary artery without angina pectoris: Secondary | ICD-10-CM

## 2011-06-02 DIAGNOSIS — R05 Cough: Secondary | ICD-10-CM

## 2011-06-02 DIAGNOSIS — R51 Headache: Secondary | ICD-10-CM

## 2011-06-02 DIAGNOSIS — R059 Cough, unspecified: Secondary | ICD-10-CM

## 2011-06-14 ENCOUNTER — Ambulatory Visit (INDEPENDENT_AMBULATORY_CARE_PROVIDER_SITE_OTHER): Payer: BC Managed Care – PPO

## 2011-06-14 DIAGNOSIS — R05 Cough: Secondary | ICD-10-CM

## 2011-06-14 DIAGNOSIS — R059 Cough, unspecified: Secondary | ICD-10-CM

## 2011-06-14 DIAGNOSIS — F17200 Nicotine dependence, unspecified, uncomplicated: Secondary | ICD-10-CM

## 2011-06-20 ENCOUNTER — Encounter (HOSPITAL_COMMUNITY): Payer: Self-pay | Admitting: Pharmacy Technician

## 2011-06-20 ENCOUNTER — Ambulatory Visit (INDEPENDENT_AMBULATORY_CARE_PROVIDER_SITE_OTHER): Payer: BC Managed Care – PPO

## 2011-06-20 DIAGNOSIS — I509 Heart failure, unspecified: Secondary | ICD-10-CM

## 2011-06-20 DIAGNOSIS — G4733 Obstructive sleep apnea (adult) (pediatric): Secondary | ICD-10-CM

## 2011-06-21 NOTE — H&P (Signed)
56 y/o female with chief complaint of right shoulder pain. Pt has elected to have a shoulder arthroscopy and distal clavicle excision to decrease pain and increase function of right upper extremity PMH: asthma, diverticulitis, chronic pain, anxiety, gerd, hypertension, hyperlipidemia, hypothyroidism, irritable bowel syndrome, sleep apnea Social: weekly etoh, no smoking, married Surgical: right rotator cuff repair, bilateral total knees, D&C, carpal tunnel release, sinus surgery, spinal surgery Family: CHF, hypertension, cancer, depression Allergies: sulfa, macrolides Meds: norco, xanax, tramadol, neurotin, tylenol, lidoderm, losartan, triamterene-HCTZ, symbicort, singulair, pantoprazole, levothyroxine, valtrex, fluticasone, lotronex, diltiazem PE: alert and appropriate 56 y/o female in no acute distress Cervical: full rom no tenderness, cranial nerves 2-12 intact Right shoulder: moderate pain and guarding with rom, nv intact distally, strength 4.5/5  Assessment: right shoulder pain due to impingement and AC arthrosis Plan: right shoulder arthroscopy and open distal clavicle.

## 2011-06-22 ENCOUNTER — Encounter (HOSPITAL_COMMUNITY): Payer: Self-pay

## 2011-06-22 NOTE — Progress Notes (Signed)
Contacted Dr. Elissa Hefty office, spoke with Asher Muir in medical records.  Requested most recent office visit notes and cardiac cath. Patients primary doctor, Dr. Cleta Alberts, notes in epic.

## 2011-06-22 NOTE — Progress Notes (Signed)
Contacted Dr. Ellis Parents office, spoke with Steward Drone, requested copy of sleep study.

## 2011-06-25 MED ORDER — CEFAZOLIN SODIUM-DEXTROSE 2-3 GM-% IV SOLR
2.0000 g | INTRAVENOUS | Status: AC
  Administered 2011-06-26: 2 g via INTRAVENOUS
  Filled 2011-06-25: qty 50

## 2011-06-26 ENCOUNTER — Ambulatory Visit (HOSPITAL_COMMUNITY): Payer: BC Managed Care – PPO

## 2011-06-26 ENCOUNTER — Encounter (HOSPITAL_COMMUNITY): Payer: Self-pay | Admitting: Anesthesiology

## 2011-06-26 ENCOUNTER — Encounter (HOSPITAL_COMMUNITY): Payer: Self-pay | Admitting: *Deleted

## 2011-06-26 ENCOUNTER — Ambulatory Visit (HOSPITAL_COMMUNITY): Payer: BC Managed Care – PPO | Admitting: Anesthesiology

## 2011-06-26 ENCOUNTER — Encounter (HOSPITAL_COMMUNITY)
Admission: RE | Disposition: A | Discharge status: Home / Self Care | Payer: Self-pay | Source: Ambulatory Visit | Attending: Orthopedic Surgery

## 2011-06-26 ENCOUNTER — Other Ambulatory Visit: Payer: Self-pay

## 2011-06-26 ENCOUNTER — Observation Stay (HOSPITAL_COMMUNITY)
Admission: RE | Admit: 2011-06-26 | Discharge: 2011-06-27 | Disposition: A | Discharge status: Home / Self Care | Payer: BC Managed Care – PPO | Source: Ambulatory Visit | Attending: Orthopedic Surgery | Admitting: Orthopedic Surgery

## 2011-06-26 DIAGNOSIS — S43439A Superior glenoid labrum lesion of unspecified shoulder, initial encounter: Secondary | ICD-10-CM | POA: Insufficient documentation

## 2011-06-26 DIAGNOSIS — M67919 Unspecified disorder of synovium and tendon, unspecified shoulder: Secondary | ICD-10-CM | POA: Insufficient documentation

## 2011-06-26 DIAGNOSIS — M719 Bursopathy, unspecified: Secondary | ICD-10-CM | POA: Insufficient documentation

## 2011-06-26 DIAGNOSIS — M19019 Primary osteoarthritis, unspecified shoulder: Secondary | ICD-10-CM | POA: Insufficient documentation

## 2011-06-26 DIAGNOSIS — M25819 Other specified joint disorders, unspecified shoulder: Principal | ICD-10-CM | POA: Insufficient documentation

## 2011-06-26 HISTORY — DX: Essential (primary) hypertension: I10

## 2011-06-26 HISTORY — DX: Anxiety disorder, unspecified: F41.9

## 2011-06-26 HISTORY — DX: Gastro-esophageal reflux disease without esophagitis: K21.9

## 2011-06-26 HISTORY — DX: Bronchitis, not specified as acute or chronic: J40

## 2011-06-26 HISTORY — DX: Pneumonia, unspecified organism: J18.9

## 2011-06-26 HISTORY — DX: Sleep apnea, unspecified: G47.30

## 2011-06-26 HISTORY — DX: Hypothyroidism, unspecified: E03.9

## 2011-06-26 HISTORY — DX: Barrett's esophagus without dysplasia: K22.70

## 2011-06-26 HISTORY — DX: Angina pectoris, unspecified: I20.9

## 2011-06-26 HISTORY — DX: Fibromyalgia: M79.7

## 2011-06-26 HISTORY — DX: Benign neoplasm, unspecified site: D36.9

## 2011-06-26 HISTORY — DX: Unspecified osteoarthritis, unspecified site: M19.90

## 2011-06-26 HISTORY — DX: Chronic obstructive pulmonary disease, unspecified: J44.9

## 2011-06-26 HISTORY — DX: Cardiac arrhythmia, unspecified: I49.9

## 2011-06-26 HISTORY — DX: Chronic kidney disease, unspecified: N18.9

## 2011-06-26 HISTORY — DX: Depression, unspecified: F32.A

## 2011-06-26 HISTORY — DX: Cardiac murmur, unspecified: R01.1

## 2011-06-26 LAB — BASIC METABOLIC PANEL
BUN: 12 mg/dL (ref 6–23)
Chloride: 102 mEq/L (ref 96–112)
GFR calc Af Amer: 85 mL/min — ABNORMAL LOW (ref >90)
Potassium: 4.1 mEq/L (ref 3.5–5.1)
Sodium: 141 mEq/L (ref 135–145)

## 2011-06-26 LAB — CBC
HCT: 37.6 % (ref 36.0–46.0)
Hemoglobin: 12.1 g/dL (ref 12.0–15.0)
RDW: 14.6 % (ref 11.5–15.5)
WBC: 9.4 10*3/uL (ref 4.0–10.5)

## 2011-06-26 LAB — SURGICAL PCR SCREEN: Staphylococcus aureus: NEGATIVE

## 2011-06-26 SURGERY — SHOULDER ARTHROSCOPY WITH OPEN ROTATOR CUFF REPAIR AND DISTAL CLAVICLE ACROMINECTOMY
Anesthesia: Regional | Site: Shoulder | Laterality: Right | Wound class: Clean

## 2011-06-26 MED ORDER — ACETAMINOPHEN 325 MG PO TABS: 650.0000 mg | ORAL_TABLET | ORAL | Status: DC

## 2011-06-26 MED ORDER — DILTIAZEM HCL ER 180 MG PO CP24
180.0000 mg | ORAL_CAPSULE | Freq: Every day | ORAL | Status: DC
Start: 2011-06-26 — End: 2011-06-27
  Administered 2011-06-26: 180 mg via ORAL
  Filled 2011-06-26 (×2): qty 1

## 2011-06-26 MED ORDER — PHENYLEPHRINE HCL 10 MG/ML IJ SOLN
INTRAMUSCULAR | Status: DC | PRN
  Administered 2011-06-26 (×4): 80 ug via INTRAVENOUS

## 2011-06-26 MED ORDER — ROCURONIUM BROMIDE 100 MG/10ML IV SOLN
INTRAVENOUS | Status: DC | PRN
  Administered 2011-06-26: 50 mg via INTRAVENOUS

## 2011-06-26 MED ORDER — LEVOTHYROXINE SODIUM 137 MCG PO TABS
137.0000 ug | ORAL_TABLET | ORAL | Status: DC
  Administered 2011-06-27: 137 ug via ORAL
  Filled 2011-06-26 (×2): qty 1

## 2011-06-26 MED ORDER — ONDANSETRON HCL 4 MG/2ML IJ SOLN: 4.0000 mg | Freq: Four times a day (QID) | INTRAMUSCULAR | Status: DC | PRN

## 2011-06-26 MED ORDER — GABAPENTIN 300 MG PO CAPS
300.0000 mg | ORAL_CAPSULE | Freq: Two times a day (BID) | ORAL | Status: DC
  Administered 2011-06-26: 300 mg via ORAL
  Filled 2011-06-26 (×4): qty 1

## 2011-06-26 MED ORDER — MENTHOL 3 MG MT LOZG: 1.0000 | LOZENGE | OROMUCOSAL | Status: DC | PRN

## 2011-06-26 MED ORDER — VALACYCLOVIR HCL 500 MG PO TABS
500.0000 mg | ORAL_TABLET | Freq: Every day | ORAL | Status: DC
  Administered 2011-06-27: 500 mg via ORAL
  Filled 2011-06-26 (×2): qty 1

## 2011-06-26 MED ORDER — GLYCOPYRROLATE 0.2 MG/ML IJ SOLN
INTRAMUSCULAR | Status: DC | PRN
  Administered 2011-06-26: .8 mg via INTRAVENOUS

## 2011-06-26 MED ORDER — PROPOFOL 10 MG/ML IV EMUL
INTRAVENOUS | Status: DC | PRN
  Administered 2011-06-26: 200 mg via INTRAVENOUS

## 2011-06-26 MED ORDER — ACETAMINOPHEN 325 MG PO TABS: 650.0000 mg | ORAL_TABLET | Freq: Four times a day (QID) | ORAL | Status: DC | PRN

## 2011-06-26 MED ORDER — CEFAZOLIN SODIUM-DEXTROSE 2-3 GM-% IV SOLR
2.0000 g | Freq: Four times a day (QID) | INTRAVENOUS | Status: DC
  Administered 2011-06-26 – 2011-06-27 (×2): 2 g via INTRAVENOUS
  Filled 2011-06-26 (×3): qty 50

## 2011-06-26 MED ORDER — HYDROMORPHONE HCL PF 1 MG/ML IJ SOLN
0.5000 mg | INTRAMUSCULAR | Status: DC | PRN
  Administered 2011-06-27 (×2): 1 mg via INTRAVENOUS
  Filled 2011-06-26 (×2): qty 1

## 2011-06-26 MED ORDER — LOSARTAN POTASSIUM 50 MG PO TABS
100.0000 mg | ORAL_TABLET | ORAL | Status: DC
  Administered 2011-06-27: 100 mg via ORAL
  Filled 2011-06-26 (×2): qty 2

## 2011-06-26 MED ORDER — PHENOL 1.4 % MT LIQD
1.0000 | OROMUCOSAL | Status: DC | PRN
  Filled 2011-06-26: qty 177

## 2011-06-26 MED ORDER — METOCLOPRAMIDE HCL 5 MG/ML IJ SOLN
5.0000 mg | Freq: Three times a day (TID) | INTRAMUSCULAR | Status: DC | PRN
  Filled 2011-06-26: qty 2

## 2011-06-26 MED ORDER — MIDAZOLAM HCL 5 MG/5ML IJ SOLN
INTRAMUSCULAR | Status: DC | PRN
  Administered 2011-06-26: 2 mg via INTRAVENOUS

## 2011-06-26 MED ORDER — METHOCARBAMOL 500 MG PO TABS
500.0000 mg | ORAL_TABLET | Freq: Four times a day (QID) | ORAL | Status: DC | PRN
  Administered 2011-06-26 – 2011-06-27 (×3): 500 mg via ORAL
  Filled 2011-06-26 (×3): qty 1

## 2011-06-26 MED ORDER — TRIAMTERENE-HCTZ 37.5-25 MG PO TABS
1.0000 | ORAL_TABLET | ORAL | Status: DC
  Filled 2011-06-26 (×2): qty 1

## 2011-06-26 MED ORDER — CHLORHEXIDINE GLUCONATE 4 % EX LIQD: 60.0000 mL | Freq: Once | CUTANEOUS | Status: DC

## 2011-06-26 MED ORDER — FLUTICASONE FUROATE 27.5 MCG/SPRAY NA SUSP
2.0000 | Freq: Every day | NASAL | Status: DC
  Filled 2011-06-26 (×4): qty 2

## 2011-06-26 MED ORDER — LIDOCAINE 5 % EX PTCH
1.0000 | MEDICATED_PATCH | CUTANEOUS | Status: DC
  Administered 2011-06-26: 1 via TRANSDERMAL
  Filled 2011-06-26 (×2): qty 1

## 2011-06-26 MED ORDER — POTASSIUM CHLORIDE IN NACL 20-0.9 MEQ/L-% IV SOLN
INTRAVENOUS | Status: DC
  Administered 2011-06-26: 1000 mL via INTRAVENOUS
  Filled 2011-06-26 (×2): qty 1000

## 2011-06-26 MED ORDER — FENTANYL CITRATE 0.05 MG/ML IJ SOLN
INTRAMUSCULAR | Status: DC | PRN
  Administered 2011-06-26 (×4): 50 ug via INTRAVENOUS

## 2011-06-26 MED ORDER — ACETAMINOPHEN ER 650 MG PO TBCR
650.0000 mg | EXTENDED_RELEASE_TABLET | ORAL | Status: DC
  Filled 2011-06-26: qty 1

## 2011-06-26 MED ORDER — CELECOXIB 200 MG PO CAPS
200.0000 mg | ORAL_CAPSULE | Freq: Every day | ORAL | Status: DC
  Administered 2011-06-26: 200 mg via ORAL
  Filled 2011-06-26 (×2): qty 1

## 2011-06-26 MED ORDER — ICAPS MV PO TABS: ORAL_TABLET | Freq: Every day | ORAL | Status: DC

## 2011-06-26 MED ORDER — MONTELUKAST SODIUM 10 MG PO TABS
10.0000 mg | ORAL_TABLET | Freq: Every day | ORAL | Status: DC
  Administered 2011-06-27: 10 mg via ORAL
  Filled 2011-06-26 (×2): qty 1

## 2011-06-26 MED ORDER — ALOSETRON HCL 0.5 MG PO TABS: 0.5000 mg | ORAL_TABLET | Freq: Every day | ORAL | Status: DC

## 2011-06-26 MED ORDER — OXYCODONE-ACETAMINOPHEN 5-325 MG PO TABS
1.0000 | ORAL_TABLET | ORAL | Status: DC | PRN
  Administered 2011-06-27: 1 via ORAL
  Administered 2011-06-27 (×2): 2 via ORAL
  Filled 2011-06-26 (×3): qty 2

## 2011-06-26 MED ORDER — NEOSTIGMINE METHYLSULFATE 1 MG/ML IJ SOLN
INTRAMUSCULAR | Status: DC | PRN
  Administered 2011-06-26: 5 mg via INTRAVENOUS

## 2011-06-26 MED ORDER — LIDOCAINE HCL (CARDIAC) 20 MG/ML IV SOLN
INTRAVENOUS | Status: DC | PRN
  Administered 2011-06-26: 80 mg via INTRAVENOUS

## 2011-06-26 MED ORDER — ONDANSETRON HCL 4 MG/2ML IJ SOLN
INTRAMUSCULAR | Status: DC | PRN
  Administered 2011-06-26: 4 mg via INTRAVENOUS

## 2011-06-26 MED ORDER — BUDESONIDE-FORMOTEROL FUMARATE 80-4.5 MCG/ACT IN AERO
2.0000 | INHALATION_SPRAY | Freq: Two times a day (BID) | RESPIRATORY_TRACT | Status: DC
  Administered 2011-06-26 – 2011-06-27 (×2): 2 via RESPIRATORY_TRACT
  Filled 2011-06-26: qty 6.9

## 2011-06-26 MED ORDER — LACTATED RINGERS IV SOLN
INTRAVENOUS | Status: DC | PRN
  Administered 2011-06-26: 13:00:00 via INTRAVENOUS

## 2011-06-26 MED ORDER — ALPRAZOLAM 0.25 MG PO TABS
0.2500 mg | ORAL_TABLET | Freq: Every evening | ORAL | Status: DC | PRN
  Filled 2011-06-26: qty 1

## 2011-06-26 MED ORDER — ACETAMINOPHEN 650 MG RE SUPP: 650.0000 mg | Freq: Four times a day (QID) | RECTAL | Status: DC | PRN

## 2011-06-26 MED ORDER — BUPIVACAINE-EPINEPHRINE PF 0.5-1:200000 % IJ SOLN
INTRAMUSCULAR | Status: DC | PRN
  Administered 2011-06-26: 30 mL

## 2011-06-26 MED ORDER — BUPIVACAINE-EPINEPHRINE 0.25% -1:200000 IJ SOLN
INTRAMUSCULAR | Status: DC | PRN
  Administered 2011-06-26: 6 mL

## 2011-06-26 MED ORDER — PANTOPRAZOLE SODIUM 40 MG PO TBEC
40.0000 mg | DELAYED_RELEASE_TABLET | Freq: Every evening | ORAL | Status: DC
Start: 2011-06-26 — End: 2011-06-27
  Administered 2011-06-26: 40 mg via ORAL
  Filled 2011-06-26: qty 1

## 2011-06-26 MED ORDER — METHOCARBAMOL 100 MG/ML IJ SOLN
500.0000 mg | Freq: Four times a day (QID) | INTRAVENOUS | Status: DC | PRN
  Filled 2011-06-26: qty 5

## 2011-06-26 MED ORDER — ONDANSETRON HCL 4 MG PO TABS: 4.0000 mg | ORAL_TABLET | Freq: Four times a day (QID) | ORAL | Status: DC | PRN

## 2011-06-26 MED ORDER — HYDROMORPHONE HCL PF 1 MG/ML IJ SOLN: 0.2500 mg | INTRAMUSCULAR | Status: DC | PRN

## 2011-06-26 MED ORDER — TRAMADOL HCL 50 MG PO TABS
25.0000 mg | ORAL_TABLET | Freq: Every day | ORAL | Status: DC
  Administered 2011-06-26: 50 mg via ORAL
  Filled 2011-06-26: qty 1

## 2011-06-26 MED ORDER — HYDROCODONE-ACETAMINOPHEN 5-325 MG PO TABS
0.5000 | ORAL_TABLET | Freq: Four times a day (QID) | ORAL | Status: DC | PRN
  Administered 2011-06-26: 1 via ORAL
  Filled 2011-06-26: qty 1

## 2011-06-26 MED ORDER — EPHEDRINE SULFATE 50 MG/ML IJ SOLN
INTRAMUSCULAR | Status: DC | PRN
  Administered 2011-06-26: 10 mg via INTRAVENOUS
  Administered 2011-06-26: 15 mg via INTRAVENOUS
  Administered 2011-06-26: 10 mg via INTRAVENOUS
  Administered 2011-06-26: 5 mg via INTRAVENOUS
  Administered 2011-06-26: 10 mg via INTRAVENOUS

## 2011-06-26 MED ORDER — METOCLOPRAMIDE HCL 10 MG PO TABS: 5.0000 mg | ORAL_TABLET | Freq: Three times a day (TID) | ORAL | Status: DC | PRN

## 2011-06-26 MED ORDER — OCUVITE-LUTEIN PO CAPS
1.0000 | ORAL_CAPSULE | Freq: Every day | ORAL | Status: DC
  Filled 2011-06-26: qty 1

## 2011-06-26 MED ORDER — MUPIROCIN 2 % EX OINT
TOPICAL_OINTMENT | CUTANEOUS | Status: AC
  Filled 2011-06-26: qty 22

## 2011-06-26 MED ORDER — SODIUM CHLORIDE 0.9 % IR SOLN
Status: DC | PRN
  Administered 2011-06-26 (×2): 3000 mL
  Administered 2011-06-26: 1000 mL

## 2011-06-26 MED ORDER — FLUTICASONE PROPIONATE 50 MCG/ACT NA SUSP
2.0000 | Freq: Every day | NASAL | Status: DC
  Filled 2011-06-26: qty 16

## 2011-06-26 SURGICAL SUPPLY — 65 items
BLADE LONG MED 31X9 (MISCELLANEOUS) IMPLANT
BLADE SURG 11 STRL SS (BLADE) ×2 IMPLANT
BUR OVAL 4.0 (BURR) ×1 IMPLANT
CLOTH BEACON ORANGE TIMEOUT ST (SAFETY) ×2 IMPLANT
COVER SURGICAL LIGHT HANDLE (MISCELLANEOUS) ×2 IMPLANT
DRAPE INCISE IOBAN 66X45 STRL (DRAPES) ×2 IMPLANT
DRAPE STERI 35X30 U-POUCH (DRAPES) ×2 IMPLANT
DRAPE U-SHAPE 47X51 STRL (DRAPES) ×2 IMPLANT
DRILL BIT 5/64 (BIT) ×1 IMPLANT
DRSG EMULSION OIL 3X3 NADH (GAUZE/BANDAGES/DRESSINGS) ×4 IMPLANT
DRSG PAD ABDOMINAL 8X10 ST (GAUZE/BANDAGES/DRESSINGS) ×4 IMPLANT
DURAPREP 26ML APPLICATOR (WOUND CARE) ×2 IMPLANT
ELECT NDL TIP 2.8 STRL (NEEDLE) ×1 IMPLANT
ELECT NEEDLE TIP 2.8 STRL (NEEDLE) ×2 IMPLANT
ELECT REM PT RETURN 9FT ADLT (ELECTROSURGICAL) ×2
ELECTRODE REM PT RTRN 9FT ADLT (ELECTROSURGICAL) IMPLANT
GAUZE SPONGE 4X4 12PLY STRL LF (GAUZE/BANDAGES/DRESSINGS) ×1 IMPLANT
GLOVE BIOGEL PI ORTHO PRO 7.5 (GLOVE) ×1
GLOVE BIOGEL PI ORTHO PRO SZ8 (GLOVE) ×1
GLOVE ORTHO TXT STRL SZ7.5 (GLOVE) ×2 IMPLANT
GLOVE PI ORTHO PRO STRL 7.5 (GLOVE) ×1 IMPLANT
GLOVE PI ORTHO PRO STRL SZ8 (GLOVE) ×1 IMPLANT
GLOVE SURG ORTHO 8.5 STRL (GLOVE) ×3 IMPLANT
GOWN PREVENTION PLUS XXLARGE (GOWN DISPOSABLE) ×1 IMPLANT
GOWN STRL NON-REIN LRG LVL3 (GOWN DISPOSABLE) ×2 IMPLANT
GOWN STRL REIN XL XLG (GOWN DISPOSABLE) ×2 IMPLANT
KIT BASIN OR (CUSTOM PROCEDURE TRAY) ×2 IMPLANT
KIT JUGGERKNOT DISP 2.9MM (KITS) IMPLANT
KIT ROOM TURNOVER OR (KITS) ×2 IMPLANT
MANIFOLD NEPTUNE II (INSTRUMENTS) ×2 IMPLANT
NDL HYPO 25GX1X1/2 BEV (NEEDLE) ×1 IMPLANT
NDL SPNL 18GX3.5 QUINCKE PK (NEEDLE) ×1 IMPLANT
NDL SUT 6 .5 CRC .975X.05 MAYO (NEEDLE) IMPLANT
NEEDLE HYPO 25GX1X1/2 BEV (NEEDLE) ×2 IMPLANT
NEEDLE MAYO TAPER (NEEDLE) ×2
NEEDLE SPNL 18GX3.5 QUINCKE PK (NEEDLE) ×2 IMPLANT
NS IRRIG 1000ML POUR BTL (IV SOLUTION) ×2 IMPLANT
PACK SHOULDER (CUSTOM PROCEDURE TRAY) ×2 IMPLANT
PAD ARMBOARD 7.5X6 YLW CONV (MISCELLANEOUS) ×3 IMPLANT
RESECTOR FULL RADIUS 4.2MM (BLADE) ×1 IMPLANT
SET ARTHROSCOPY TUBING (MISCELLANEOUS) ×2
SET ARTHROSCOPY TUBING LN (MISCELLANEOUS) ×1 IMPLANT
SLING ARM FOAM STRAP LRG (SOFTGOODS) ×2 IMPLANT
SLING ARM FOAM STRAP MED (SOFTGOODS) IMPLANT
SPONGE GAUZE 4X4 12PLY (GAUZE/BANDAGES/DRESSINGS) ×1 IMPLANT
SPONGE LAP 4X18 X RAY DECT (DISPOSABLE) ×1 IMPLANT
STRIP CLOSURE SKIN 1/2X4 (GAUZE/BANDAGES/DRESSINGS) ×2 IMPLANT
SUCTION FRAZIER TIP 10 FR DISP (SUCTIONS) ×3 IMPLANT
SUT BONE WAX W31G (SUTURE) IMPLANT
SUT FIBERWIRE #2 38 T-5 BLUE (SUTURE)
SUT MNCRL AB 4-0 PS2 18 (SUTURE) ×2 IMPLANT
SUT VIC AB 0 CT1 27 (SUTURE)
SUT VIC AB 0 CT1 27XBRD ANBCTR (SUTURE) IMPLANT
SUT VIC AB 0 CT2 27 (SUTURE) IMPLANT
SUT VIC AB 2-0 CT1 27 (SUTURE)
SUT VIC AB 2-0 CT1 TAPERPNT 27 (SUTURE) IMPLANT
SUT VICRYL 0 CT 1 36IN (SUTURE) ×6 IMPLANT
SUTURE FIBERWR #2 38 T-5 BLUE (SUTURE) IMPLANT
SYR CONTROL 10ML LL (SYRINGE) ×2 IMPLANT
TAPE CLOTH SURG 6X10 WHT LF (GAUZE/BANDAGES/DRESSINGS) ×1 IMPLANT
TOWEL OR 17X24 6PK STRL BLUE (TOWEL DISPOSABLE) ×2 IMPLANT
TOWEL OR 17X26 10 PK STRL BLUE (TOWEL DISPOSABLE) ×2 IMPLANT
TUBE CONNECTING 12X1/4 (SUCTIONS) ×2 IMPLANT
WAND 90 DEG TURBOVAC W/CORD (SURGICAL WAND) ×2 IMPLANT
WATER STERILE IRR 1000ML POUR (IV SOLUTION) ×2 IMPLANT

## 2011-06-26 NOTE — Interval H&P Note (Signed)
History and Physical Interval Note:  06/26/2011 12:15 PM  Michaela Raymond  has presented today for surgery, with the diagnosis of RIGHT SHOULDER IMPINGEMENT OA  AND FLAP TEAR  The various methods of treatment have been discussed with the patient and family. After consideration of risks, benefits and other options for treatment, the patient has consented to  Procedure(s): SHOULDER ARTHROSCOPY WITH OPEN ROTATOR CUFF REPAIR AND DISTAL CLAVICLE ACROMINECTOMY as a surgical intervention .  The patients' history has been reviewed, patient examined, no change in status, stable for surgery.  I have reviewed the patients' chart and labs.  Questions were answered to the patient's satisfaction.     Anaih Brander,STEVEN R

## 2011-06-26 NOTE — Anesthesia Postprocedure Evaluation (Signed)
Anesthesia Post Note  Patient: Michaela Raymond  Procedure(s) Performed:  SHOULDER ARTHROSCOPY WITH OPEN ROTATOR CUFF REPAIR AND DISTAL CLAVICLE ACROMINECTOMY - OPEN DISTAL CLAVICLE RESECTION  Anesthesia type: General  Patient location: PACU  Post pain: Pain level controlled and Adequate analgesia  Post assessment: Post-op Vital signs reviewed, Patient's Cardiovascular Status Stable, Respiratory Function Stable, Patent Airway and Pain level controlled  Last Vitals:  Filed Vitals:   06/26/11 1600  BP:   Pulse: 78  Temp: 36.6 C  Resp: 19    Post vital signs: Reviewed and stable  Level of consciousness: awake, alert  and oriented  Complications: No apparent anesthesia complications

## 2011-06-26 NOTE — Brief Op Note (Signed)
06/26/2011  2:37 PM  PATIENT:  Michaela Raymond  56 y.o. female  PRE-OPERATIVE DIAGNOSIS:  RIGHT SHOULDER IMPINGEMENT OA  AND FLAP TEAR  POST-OPERATIVE DIAGNOSIS:  RIGHT SHOULDER IMPINGEMENT OA  AND FLAP TEAR  PROCEDURE:  Procedure(s): SHOULDER ARTHROSCOPY WITH OPEN ROTATOR CUFF REPAIR AND DISTAL CLAVICLE ACROMINECTOMY  SURGEON:  Surgeon(s): Verlee Rossetti  PHYSICIAN ASSISTANT:   ASSISTANTS: Thea Gist, PA-C   ANESTHESIA:   regional and general  EBL:  Total I/O In: -  Out: 100 [Blood:100]  BLOOD ADMINISTERED:none  DRAINS: none   LOCAL MEDICATIONS USED:  MARCAINE 10 CC  SPECIMEN:  No Specimen  DISPOSITION OF SPECIMEN:  N/A  COUNTS:  YES  TOURNIQUET:  * No tourniquets in log *  DICTATION: .Other Dictation: Dictation Number [id number]  PLAN OF CARE: Admit for overnight observation  PATIENT DISPOSITION:  PACU - hemodynamically stable.   Delay start of Pharmacological VTE agent (>24hrs) due to surgical blood loss or risk of bleeding:  {YES/NO/NOT APPLICABLE:20182

## 2011-06-26 NOTE — Transfer of Care (Signed)
Immediate Anesthesia Transfer of Care Note  Patient: Michaela Raymond  Procedure(s) Performed:  SHOULDER ARTHROSCOPY WITH OPEN ROTATOR CUFF REPAIR AND DISTAL CLAVICLE ACROMINECTOMY - OPEN DISTAL CLAVICLE RESECTION  Patient Location: PACU  Anesthesia Type: General  Level of Consciousness: oriented, lethargic and responds to stimulation  Airway & Oxygen Therapy: Patient Spontanous Breathing and Patient connected to nasal cannula oxygen  Post-op Assessment: Report given to PACU RN  Post vital signs: Reviewed and stable  Complications: No apparent anesthesia complications

## 2011-06-26 NOTE — Anesthesia Procedure Notes (Signed)
Anesthesia Regional Block:  Interscalene brachial plexus block  Pre-Anesthetic Checklist: ,, timeout performed, Correct Patient, Correct Site, Correct Laterality, Correct Procedure, Correct Position, site marked, Risks and benefits discussed,  Surgical consent,  Pre-op evaluation,  At surgeon's request and post-op pain management  Laterality: Right  Prep: chloraprep       Needles:  Injection technique: Single-shot  Needle Type: Echogenic Stimulator Needle     Needle Length: 5cm 5 cm Needle Gauge: 22 and 22 G    Additional Needles:  Procedures: ultrasound guided and nerve stimulator Interscalene brachial plexus block  Nerve Stimulator or Paresthesia:  Response: biceps flexion, 0.45 mA,   Additional Responses:   Narrative:  Start time: 06/26/2011 12:25 PM End time: 06/26/2011 12:35 PM Injection made incrementally with aspirations every 5 mL.  Performed by: Personally  Anesthesiologist: Dr Chaney Malling  Additional Notes: Functioning IV was confirmed and monitors were applied.  A 50mm 22ga Arrow echogenic stimulator needle was used. Sterile prep and drape,hand hygiene and sterile gloves were used.  Negative aspiration and negative test dose prior to incremental administration of local anesthetic. The patient tolerated the procedure well.  Ultrasound guidance: relevent anatomy identified, needle position confirmed, local anesthetic spread visualized around nerve(s), vascular puncture avoided.  Image printed for medical record.   Interscalene brachial plexus block

## 2011-06-26 NOTE — Progress Notes (Signed)
Cath,echo in epic 

## 2011-06-26 NOTE — Preoperative (Signed)
Beta Blockers   Reason not to administer Beta Blockers:Not Applicable 

## 2011-06-26 NOTE — Anesthesia Preprocedure Evaluation (Addendum)
Anesthesia Evaluation  Patient identified by MRN, date of birth, ID band Patient awake    Reviewed: Allergy & Precautions, H&P , NPO status , Patient's Chart, lab work & pertinent test results  Airway Mallampati: II TM Distance: <3 FB Neck ROM: full    Dental  (+) Dental Advisory Given and Teeth Intact   Pulmonary shortness of breath, asthma , sleep apnea , pneumonia , COPD         Cardiovascular hypertension, - angina+ dysrhythmias + Valvular Problems/Murmurs     Neuro/Psych  Headaches, PSYCHIATRIC DISORDERS Anxiety Depression  Neuromuscular disease    GI/Hepatic GERD-  ,  Endo/Other  Hypothyroidism   Renal/GU      Musculoskeletal  (+) Fibromyalgia -  Abdominal   Peds  Hematology   Anesthesia Other Findings   Reproductive/Obstetrics                         Anesthesia Physical Anesthesia Plan  ASA: III  Anesthesia Plan: General and Regional   Post-op Pain Management: MAC Combined w/ Regional for Post-op pain   Induction: Intravenous  Airway Management Planned: Oral ETT  Additional Equipment:   Intra-op Plan:   Post-operative Plan: Extubation in OR  Informed Consent: I have reviewed the patients History and Physical, chart, labs and discussed the procedure including the risks, benefits and alternatives for the proposed anesthesia with the patient or authorized representative who has indicated his/her understanding and acceptance.     Plan Discussed with: CRNA and Surgeon  Anesthesia Plan Comments:         Anesthesia Quick Evaluation

## 2011-06-27 LAB — BASIC METABOLIC PANEL
CO2: 26 mEq/L (ref 19–32)
Chloride: 102 mEq/L (ref 96–112)
GFR calc Af Amer: >90 mL/min (ref >90)
Potassium: 3.8 mEq/L (ref 3.5–5.1)

## 2011-06-27 LAB — CBC
HCT: 36 % (ref 36.0–46.0)
Platelets: 318 10*3/uL (ref 150–400)
RBC: 4.21 MIL/uL (ref 3.87–5.11)
RDW: 15 % (ref 11.5–15.5)
WBC: 10.2 10*3/uL (ref 4.0–10.5)

## 2011-06-27 MED ORDER — PROSIGHT PO TABS
1.0000 | ORAL_TABLET | Freq: Every day | ORAL | Status: DC
Start: 2011-06-27 — End: 2011-06-27

## 2011-06-27 MED ORDER — METHOCARBAMOL 500 MG PO TABS: 500.0000 mg | ORAL_TABLET | Freq: Four times a day (QID) | ORAL | Status: AC | PRN

## 2011-06-27 MED ORDER — OXYCODONE-ACETAMINOPHEN 5-325 MG PO TABS: 1.0000 | ORAL_TABLET | ORAL | Status: AC | PRN

## 2011-06-27 MED FILL — Mupirocin Oint 2%: CUTANEOUS | Qty: 22 | Status: AC

## 2011-06-27 NOTE — Progress Notes (Signed)
Utilization review completed. Deandrae Wajda, RN, BSN. 06/27/11  

## 2011-06-27 NOTE — Op Note (Signed)
Michaela Raymond, PONCIANO NO.:  [id number]  MEDICAL RECORD NO.:  [id number]  LOCATION:  5038                         FACILITY:  [facility]  PHYSICIAN:  Almedia Balls. Ranell Patrick, M.D. DATE OF BIRTH:  10-31-1955  DATE OF PROCEDURE:  06/26/2011 DATE OF DISCHARGE:                              OPERATIVE REPORT   PREOPERATIVE DIAGNOSIS:  Right shoulder impingement and superior labral anterior-posterior lesion, and osteoarthritis of the acromioclavicular joint.  POSTOPERATIVE DIAGNOSES: 1. Right shoulder superior labral anterior-posterior lesion with     chronic biceps rupture. 2. Right shoulder glenohumeral osteoarthritis. 3. 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 4. Right shoulder chronic impingement. 5. Right shoulder acromioclavicular joint arthritis, symptomatic.  PROCEDURE PERFORMED:  Right shoulder arthroscopy with extensive intra- articular debridement of torn superior labrum anterior-posterior and debridement of biceps stump, arthroscopic debridement partial-thickness rotator cuff tear, arthroscopic chondroplasty, and labral debridement, arthroscopic subacromial decompression with scar release, followed by open distal clavicle resection.  ATTENDING SURGEON:  Almedia Balls. Ranell Patrick, MD  ASSISTANT:  Donnie Coffin. Dixon, PA  ANESTHESIA:  General anesthesia was used plus interscalene block.  ESTIMATED BLOOD LOSS:  Minimal.  FLUID REPLACEMENT:  1200 mL crystalloid.  INSTRUMENT COUNT:  Correct.  COMPLICATIONS:  None.  Perioperative antibiotics were given.  INDICATIONS:  The patient is a 56 year old female with worsening right shoulder pain secondary to chronic degenerative changes in the shoulder including a SLAP lesion, suspected tear in the biceps as well as advanced AC arthrosis without outlet impingement.  The patient has had prior open rotator cuff procedure done years ago.  She has recently complained of increasing pain and loss of range of  motion.  She presents for refractory shoulder pain desiring operative treatment and informed consent was obtained.  DESCRIPTION OF PROCEDURE:  After an adequate level of anesthesia was achieved, the patient was positioned in modified beach-chair position. Right shoulder exam under anesthesia.  Forward elevation 130 degrees, abduction 90, external rotation is 45, internal rotation 30 with the arm abducted.  We did a gentle manipulation and did not notice any improvement in motion.  We then sterilely prepped and draped the right shoulder and arm in the usual manner.  Time-out called.  We entered the shoulder using standard portals including anterior, posterior and lateral portals.  We identified advanced synovitis and degeneration and tearing of the shoulder with incredible labral tearing within the shoulder.  The biceps was traumatically ruptured with a stump, remaining which we debrided using a motorized suction shaver.  The subscapularis looked largely intact anteriorly.  The rotator cuff had partial- thickness tearing that involved the entire supraspinatus and infraspinatus tendon back to the teres minor which looked a little more like normal.  There was scarring noted too in the rotator cuff capsular interval  following debridement of some of the loose rotator cuff tissue back to the rotator cuff footprint and scuffing that up with a shaver to encourage healing, debridement of the labrum, chondroplasty, and debridement of biceps tendon.  We placed the scope in subacromial space and through this advanced glenohumeral chondromalacia including grade 4 changes in the shoulder.  We then placed the scope in subacromial space, performed a thorough scar removal  and subacromial decompression including acromioplasty with a butcher block technique creating a type 1 acromial shape.  We did notice some bursal tearing of the rotator cuff, but I could palpate using my shaver all the way across and  felt like that cuff was in decent condition.  I really did not see a reason to do an open incision on her.  I was concerned about as bad as arthritis and the less invasive surgery was for her the better in terms of trying to prevent re-scarring and also did decrease her recovery time.  Once we were happy with our decompression all the way over the [facility] joint removing all the spurs, we concluded the arthroscopic portion of the surgery, then made a Langer line skin incision, saber-type incision overlying the AC joint.  Dissection down through subcutaneous tissues using Bovie electrocautery.  Subperiosteal dissection distal clavicle performed with the needle-tip Bovie.  We then excised the distal 3 mm using an oscillating saw.  We thoroughly irrigated this AC interval.  We applied bone wax to the cut in the clavicle, and then we repaired the deltotrapezial fascia anatomically with 0 Vicryl suture, followed by 2-0 Vicryl subcutaneous closure, and 4-0 Monocryl for the skin.  Steri- Strips were applied, followed by sterile dressing.  The patient tolerated the procedure well.     Almedia Balls. Ranell Patrick, M.D.     SRN/MEDQ  D:  06/26/2011  T:  06/27/2011  Job:  [id number]

## 2011-06-27 NOTE — Progress Notes (Signed)
Occupational Therapy Evaluation Patient Details Name: Michaela Raymond MRN: [id number] DOB: January 18, 1956 Today's Date: 06/27/2011 931-1000am   Problem List:  Patient Active Problem List  Diagnoses  . Other affections of shoulder region, not elsewhere classified    Past Medical History:  Past Medical History  Diagnosis Date  . Dysrhythmia   . Angina   . Heart murmur   . Asthma   . Hypertension   . COPD (chronic obstructive pulmonary disease)   . Bronchitis   . Pneumonia   . Shortness of breath   . Sleep apnea     cpap machine x 9 years  . Hypothyroidism   . Adenoma     of thyroid gland, benign  . Chronic kidney disease     cyst on right kidney hx of  . Barrett's esophagus   . GERD (gastroesophageal reflux disease)   . Headache   . Fibromyalgia   . Arthritis     osteoarthrits  . Depression   . Anxiety    Past Surgical History:  Past Surgical History  Procedure Date  . Knee arthroplasty     bilaterally  . Back surgery     spinal fusion  . Thyroidectomy   . Rotator cuff repair     2001  . Joint replacement   . Tonsillectomy     and adnoids removed  . Dilation and curettage of uterus   . Cardiac catheterization   . Nasal sinus surgery     1989 and mucous cele removed  . Esophagogastroduodenoscopy   . Colonoscopy     OT Assessment/Plan/Recommendation  Pt reports she will f/u with MD and begin therapy on Friday of this week OT Goals  none set for this venue  OT Evaluation Precautions/Restrictions  Restrictions Weight Bearing Restrictions: No Prior Functioning  independent  PAIN: pt reports being in pain and awaiting the RN for meds. ADL  Pt able to perform toileting, toilet transfers and functional mobility without AD at Mod I level. Vision/Perception   wears glasses Cognition Cognition Orientation Level: Oriented X4 Exercises   Pt and husband educated on pendulum exercises (called MD and he confirmed these would cleared to perform. Education: Pt  and pt's husband educated on should sling, ADL education, pendulum exercises, elbow, wrist, hand active ROM. Instructions also, included donning and doffing shirt, method for sponge bathing under operated UE, donning and doffing sling, correct positioning of sling, proper position of operated UE when showering, positioning of UE while sleeping. Method: demonstration, explanation and handout; pt/family performance level: can return demonstration.  No further OT needs at this time. Pt and husband agree with this plan.  Melonie Florida 06/27/2011, 11:37 AM

## 2011-06-27 NOTE — Progress Notes (Signed)
Orthopedics Progress Note  Subjective: Pt has mild pain to right shoulder but otherwise doing well. Ready for d/c  Objective:  Filed Vitals:   06/27/11 0505  BP: 124/53  Pulse: 80  Temp: 98.5 F (36.9 C)  Resp: 18    General: Awake and alert  Musculoskeletal: right shoulder dressing and sling intact, nv intact distally Neurovascularly intact  Lab Results  Component Value Date   WBC 10.2 06/27/2011   HGB 11.5* 06/27/2011   HCT 36.0 06/27/2011   MCV 85.5 06/27/2011   PLT 318 06/27/2011       Component Value Date/Time   NA 141 06/26/2011 1125   K 4.1 06/26/2011 1125   CL 102 06/26/2011 1125   CO2 29 06/26/2011 1125   GLUCOSE 113* 06/26/2011 1125   BUN 12 06/26/2011 1125   CREATININE 0.87 06/26/2011 1125   CALCIUM 9.4 06/26/2011 1125   GFRNONAA 73* 06/26/2011 1125   GFRAA 85* 06/26/2011 1125    Lab Results  Component Value Date   INR 0.97 05/03/2010    Assessment/Plan: POD #1 s/p Procedure(s):right SHOULDER ARTHROSCOPY WITH OPEN ROTATOR CUFF REPAIR AND DISTAL CLAVICLE ACROMINECTOMY  Plan: d/c home today, follow up in 2 weeks  Almedia Balls. Ranell Patrick, MD 06/27/2011 7:56 AM

## 2011-06-27 NOTE — Progress Notes (Signed)
06/27/11 10:12 PT Note: OT evaluated pt and reports pt does not have any PT needs at this time.  Signing off without performing formal evaluation.  Please reorder if needed.  Thanks.  06/27/2011 Cephus Shelling, PT, DPT [phone]

## 2011-06-28 NOTE — Discharge Summary (Signed)
Physician Discharge Summary  Patient ID: Michaela Raymond MRN: [id number] DOB/AGE: Aug 12, 1955 56 y.o.  Admit date: 06/26/2011 Discharge date: 06/27/2011  Admission Diagnoses:  Right shoulder pain  Discharge Diagnoses:  Same   Surgeries: Procedure(s):right SHOULDER ARTHROSCOPY WITH OPEN ROTATOR CUFF REPAIR AND DISTAL CLAVICLE ACROMINECTOMY on 06/26/2011   Consultants: physical therapy  Discharged Condition: Stable  Hospital Course: Michaela Raymond is an 56 y.o. female who was admitted 06/26/2011 with a chief complaint of No chief complaint on file. , and found to have a diagnosis of Other affections of shoulder region, not elsewhere classified.  They were brought to the operating room on 06/26/2011 and underwent the above named procedures.    The patient had an uncomplicated hospital course and was stable for discharge.  Recent vital signs:  Filed Vitals:   06/27/11 0505  BP: 124/53  Pulse: 80  Temp: 98.5 F (36.9 C)  Resp: 18    Recent laboratory studies:  Results for orders placed during the hospital encounter of 06/26/11  CBC      Component Value Range   WBC 9.4  4.0 - 10.5 (K/uL)   RBC 4.46  3.87 - 5.11 (MIL/uL)   Hemoglobin 12.1  12.0 - 15.0 (g/dL)   HCT 40.9  81.1 - 91.4 (%)   MCV 84.3  78.0 - 100.0 (fL)   MCH 27.1  26.0 - 34.0 (pg)   MCHC 32.2  30.0 - 36.0 (g/dL)   RDW 78.2  95.6 - 21.3 (%)   Platelets 331  150 - 400 (K/uL)  BASIC METABOLIC PANEL      Component Value Range   Sodium 141  135 - 145 (mEq/L)   Potassium 4.1  3.5 - 5.1 (mEq/L)   Chloride 102  96 - 112 (mEq/L)   CO2 29  19 - 32 (mEq/L)   Glucose, Bld 113 (*) 70 - 99 (mg/dL)   BUN 12  6 - 23 (mg/dL)   Creatinine, Ser 0.86  0.50 - 1.10 (mg/dL)   Calcium 9.4  8.4 - 57.8 (mg/dL)   GFR calc non Af Amer 73 (*) >90 (mL/min)   GFR calc Af Amer 85 (*) >90 (mL/min)  SURGICAL PCR SCREEN      Component Value Range   MRSA, PCR NEGATIVE  NEGATIVE    Staphylococcus aureus NEGATIVE  NEGATIVE   CBC      Component  Value Range   WBC 10.2  4.0 - 10.5 (K/uL)   RBC 4.21  3.87 - 5.11 (MIL/uL)   Hemoglobin 11.5 (*) 12.0 - 15.0 (g/dL)   HCT 46.9  62.9 - 52.8 (%)   MCV 85.5  78.0 - 100.0 (fL)   MCH 27.3  26.0 - 34.0 (pg)   MCHC 31.9  30.0 - 36.0 (g/dL)   RDW 41.3  24.4 - 01.0 (%)   Platelets 318  150 - 400 (K/uL)  BASIC METABOLIC PANEL      Component Value Range   Sodium 140  135 - 145 (mEq/L)   Potassium 3.8  3.5 - 5.1 (mEq/L)   Chloride 102  96 - 112 (mEq/L)   CO2 26  19 - 32 (mEq/L)   Glucose, Bld 112 (*) 70 - 99 (mg/dL)   BUN 10  6 - 23 (mg/dL)   Creatinine, Ser 2.72  0.50 - 1.10 (mg/dL)   Calcium 8.8  8.4 - 53.6 (mg/dL)   GFR calc non Af Amer >90  >90 (mL/min)   GFR calc Af Amer >90  >90 (mL/min)  Discharge Medications:   Discharge Medication List as of 06/27/2011 10:28 AM    START taking these medications   Details  methocarbamol (ROBAXIN) 500 MG tablet Take 1 tablet (500 mg total) by mouth every 6 (six) hours as needed., Starting 06/27/2011, Until Sat 07/07/11, Normal    oxyCODONE-acetaminophen (PERCOCET) 5-325 MG per tablet Take 1-2 tablets by mouth every 4 (four) hours as needed., Starting 06/27/2011, Until Sat 07/07/11, Print      CONTINUE these medications which have NOT CHANGED   Details  acetaminophen (TYLENOL) 650 MG CR tablet Take 650 mg by mouth every morning. For arthitis , Until Discontinued, Historical Med    alosetron (LOTRONEX) 0.5 MG tablet Take 0.5 mg by mouth at bedtime.  , Until Discontinued, Historical Med    ALPRAZolam (XANAX) 0.5 MG tablet Take 0.25 mg by mouth at bedtime as needed. For sleep , Until Discontinued, Historical Med    budesonide-formoterol (SYMBICORT) 80-4.5 MCG/ACT inhaler Inhale 2 puffs into the lungs 2 (two) times daily.  , Until Discontinued, Historical Med    celecoxib (CELEBREX) 200 MG capsule Take 200 mg by mouth daily.  , Until Discontinued, Historical Med    diltiazem (DILACOR XR) 180 MG 24 hr capsule Take 180 mg by mouth at bedtime.  , Until  Discontinued, Historical Med    fluticasone (VERAMYST) 27.5 MCG/SPRAY nasal spray Place 2 sprays into the nose at bedtime.  , Until Discontinued, Historical Med    gabapentin (NEURONTIN) 300 MG capsule Take 300 mg by mouth 2 (two) times daily.  , Until Discontinued, Historical Med    HYDROcodone-acetaminophen (NORCO) 5-325 MG per tablet Take 0.5-1 tablets by mouth every 6 (six) hours as needed. For severe pain , Until Discontinued, Historical Med    levothyroxine (SYNTHROID, LEVOTHROID) 137 MCG tablet Take 137 mcg by mouth every morning.  , Until Discontinued, Historical Med    lidocaine (LIDODERM) 5 % Place 1 patch onto the skin daily. Lower back.   Remove & Discard patch within 12 hours or as directed by MD , Until Discontinued, Historical Med    losartan (COZAAR) 100 MG tablet Take 100 mg by mouth every morning.  , Until Discontinued, Historical Med    montelukast (SINGULAIR) 10 MG tablet Take 10 mg by mouth at bedtime.  , Until Discontinued, Historical Med    Multiple Vitamins-Minerals (ICAPS MV PO) Take 1 capsule by mouth daily.  , Until Discontinued, Historical Med    OVER THE COUNTER MEDICATION Take 1 capsule by mouth daily. Super quercetin (nutritional supplement for breathing/ lungs) , Until Discontinued, Historical Med    pantoprazole (PROTONIX) 40 MG tablet Take 40 mg by mouth every evening.  , Until Discontinued, Historical Med    traMADol (ULTRAM) 50 MG tablet Take 25-50 mg by mouth daily.  , Until Discontinued, Historical Med    triamterene-hydrochlorothiazide (MAXZIDE-25) 37.5-25 MG per tablet Take 1 tablet by mouth every morning.  , Until Discontinued, Historical Med    valACYclovir (VALTREX) 500 MG tablet Take 500 mg by mouth at bedtime.  , Until Discontinued, Historical Med        Diagnostic Studies: Dg Chest 2 View  06/26/2011  *RADIOLOGY REPORT*  Clinical Data: Of asthma, angina, heart murmur, hypertension, COPD, and smoking.  CHEST - 2 VIEW  Comparison: 10/24/2007.   CT 02/19/2009.  Findings: Cardiac silhouette is borderline size.  Mediastinal and hilar contours appear stable.  There is slight overall hyperinflation configuration with slight flattening of the diaphragm on lateral image.  Minimal  increased density in left base most likely reflects fibrotic change.  There is linear area of subsegmental atelectasis or fibrosis in the right base.  No pulmonary edema or pleural effusion is evident.  Changes of degenerative spondylosis and degenerative disc disease are present.  IMPRESSION: Borderline heart size with no evidence of pulmonary edema or pneumonia.  Slight hyperinflation configuration consistent with element of COPD.  Basilar subsegmental atelectasis or fibrosis.  Original Report Authenticated By: Crawford Givens, M.D.    Disposition: Home or Self Care  Discharge Orders    Future Orders Please Complete By Expires   Diet - low sodium heart healthy      Call MD / Call 911      Comments:   If you experience chest pain or shortness of breath, CALL 911 and be transported to the hospital emergency room.  If you develope a fever above 101 F, pus (white drainage) or increased drainage or redness at the wound, or calf pain, call your surgeon's office.   Constipation Prevention      Comments:   Drink plenty of fluids.  Prune juice may be helpful.  You may use a stool softener, such as Colace (over the counter) 100 mg twice a day.  Use MiraLax (over the counter) for constipation as needed.   Increase activity slowly as tolerated      Weight Bearing as taught in Physical Therapy      Comments:   Use a walker or crutches as instructed.         SignedThea Gist 06/28/2011, 8:43 AM

## 2011-07-03 ENCOUNTER — Ambulatory Visit: Payer: BC Managed Care – PPO | Admitting: Emergency Medicine

## 2011-07-15 ENCOUNTER — Ambulatory Visit (INDEPENDENT_AMBULATORY_CARE_PROVIDER_SITE_OTHER): Payer: BC Managed Care – PPO

## 2011-07-15 DIAGNOSIS — N390 Urinary tract infection, site not specified: Secondary | ICD-10-CM

## 2011-07-18 ENCOUNTER — Other Ambulatory Visit: Payer: Self-pay | Admitting: Emergency Medicine

## 2011-08-16 ENCOUNTER — Other Ambulatory Visit: Payer: Self-pay | Admitting: Physician Assistant

## 2011-08-16 ENCOUNTER — Telehealth: Payer: Self-pay

## 2011-08-16 NOTE — Telephone Encounter (Signed)
Pt had called to check on RF request for Lidoderm patches that she needs bf going OOT tomorrow. Called pt back to let her know Dr Cleta Alberts approved RFs and they have been sent in electronically

## 2011-08-17 ENCOUNTER — Other Ambulatory Visit: Payer: Self-pay | Admitting: Family Medicine

## 2011-08-21 ENCOUNTER — Ambulatory Visit (INDEPENDENT_AMBULATORY_CARE_PROVIDER_SITE_OTHER): Payer: BC Managed Care – PPO | Admitting: Physician Assistant

## 2011-08-21 VITALS — BP 111/67 | HR 75 | Temp 98.7°F | Resp 16 | Ht 63.5 in | Wt 237.0 lb

## 2011-08-21 DIAGNOSIS — J309 Allergic rhinitis, unspecified: Secondary | ICD-10-CM

## 2011-08-21 DIAGNOSIS — J029 Acute pharyngitis, unspecified: Secondary | ICD-10-CM

## 2011-08-21 DIAGNOSIS — J069 Acute upper respiratory infection, unspecified: Secondary | ICD-10-CM

## 2011-08-21 LAB — POCT RAPID STREP A (OFFICE): Rapid Strep A Screen: NEGATIVE

## 2011-08-21 MED ORDER — IPRATROPIUM BROMIDE 0.03 % NA SOLN: 2.0000 | Freq: Two times a day (BID) | NASAL | Status: DC

## 2011-08-21 NOTE — Progress Notes (Signed)
  Subjective:    Patient ID: Michaela Raymond, female    DOB: 1956-04-20, 56 y.o.   MRN: [id number]  HPI  This patient presents, accompanied by her daughter with similar symptoms, complaining of several days of upper respiratory type symptoms. She has increased nasal congestion, lots of postnasal drainage and cough. Feels tired and more achy than usual. No fever or chills. Symptoms began while on her daughter's cheerleading team trip to [place], [place]. Several of the team members have been diagnosed with strep throat, several others with viral upper respiratory infections. No GI or GU symptoms. No increase in her typical myalgias-she has fibromyalgia.  Review of Systems As above.    Objective:   Physical Exam  Vitals reviewed. Constitutional: She is oriented to person, place, and time. Vital signs are normal. She appears well-developed and well-nourished. No distress.  HENT:  Head: Normocephalic and atraumatic.  Right Ear: Hearing, tympanic membrane, external ear and ear canal normal.  Left Ear: Hearing, tympanic membrane, external ear and ear canal normal.  Nose: Mucosal edema and rhinorrhea present.  No foreign bodies. Right sinus exhibits no maxillary sinus tenderness and no frontal sinus tenderness. Left sinus exhibits no maxillary sinus tenderness and no frontal sinus tenderness.  Mouth/Throat: Uvula is midline, oropharynx is clear and moist and mucous membranes are normal. No uvula swelling. No oropharyngeal exudate.  Eyes: Conjunctivae and EOM are normal. Pupils are equal, round, and reactive to light. Right eye exhibits no discharge. Left eye exhibits no discharge. No scleral icterus.  Neck: Trachea normal, normal range of motion and full passive range of motion without pain. Neck supple. No mass and no thyromegaly present.  Cardiovascular: Normal rate, regular rhythm and normal heart sounds.   Pulmonary/Chest: Effort normal and breath sounds normal.  Lymphadenopathy:       Head (right  side): No submandibular, no tonsillar, no preauricular, no posterior auricular and no occipital adenopathy present.       Head (left side): No submandibular, no tonsillar, no preauricular and no occipital adenopathy present.    She has no cervical adenopathy.       Right: No supraclavicular adenopathy present.       Left: No supraclavicular adenopathy present.  Neurological: She is alert and oriented to person, place, and time. She has normal strength. No cranial nerve deficit or sensory deficit.  Skin: Skin is warm, dry and intact. No rash noted.  Psychiatric: She has a normal mood and affect. Her speech is normal and behavior is normal.   Results for orders placed in visit on 08/21/11  POCT RAPID STREP A (OFFICE)      Component Value Range   Rapid Strep A Screen Negative  Negative        Assessment & Plan:  Viral upper respiratory infection.  Supportive care. Add Atrovent nasal spray to her current regimen. Lots of rest. Lots of fluids. If her symptoms do not begin to improve in 2-3 days she is instructed to call. At that point I would prescribe Omnicef 300 mg, 2 by mouth daily x10 days, #20 no refills.

## 2011-08-21 NOTE — Patient Instructions (Signed)
Get lots of rest and drink at least 64 ounces of water daily. 

## 2011-08-23 ENCOUNTER — Telehealth: Payer: Self-pay

## 2011-08-23 NOTE — Telephone Encounter (Signed)
.  [facility]    PT TOLD TO CALL CHELLE TODAY IF SHE AND DAUGHTER HAILEY WERE NOT BETTER,THEY ARE NOT,HAILEY HAS TERRIBLE COUGH,PLEASE ADVISE   BEST PHONE [phone]   [facility] [address]

## 2011-08-24 ENCOUNTER — Other Ambulatory Visit: Payer: Self-pay | Admitting: Family Medicine

## 2011-08-24 MED ORDER — CEFDINIR 300 MG PO CAPS: 600.0000 mg | ORAL_CAPSULE | Freq: Every day | ORAL | Status: DC

## 2011-08-24 MED ORDER — BENZONATATE 100 MG PO CAPS: ORAL_CAPSULE | ORAL | Status: AC

## 2011-08-24 MED ORDER — MAGIC MOUTHWASH W/LIDOCAINE: 5.0000 mL | Freq: Three times a day (TID) | ORAL | Status: DC | PRN

## 2011-08-24 MED ORDER — CEFDINIR 300 MG PO CAPS: 600.0000 mg | ORAL_CAPSULE | Freq: Every day | ORAL | Status: AC

## 2011-08-24 NOTE — Telephone Encounter (Signed)
Rx'd antibiotic and tessalon perles for this patient, like my note indicates I would if she called without improvement.  Please advise patient.  I'm also looking at her daughter's chart next.

## 2011-08-24 NOTE — Telephone Encounter (Signed)
Pt called back, reporting that she was supposed to CB yesterday, which she did, if she and her daughter Gladys Damme were not doing better by then. Reports neither have improved. Neither running fever. Pt's mouth is still VERY sore like she has thrush. Both pt's are still coughing and blowing green mucus. Daughter's cough is very bad and if she could get a cough med that would help her sleep that would be best for her. Pebbles would rather have a cough med that she can take during the day and still function. Please Rx Abxs for them, and whatever Reene needs for her mouth soreness. [facility]

## 2011-08-24 NOTE — Telephone Encounter (Signed)
Patient notified.  Routed to [facility] to change pharmacy to [facility]

## 2011-08-24 NOTE — Telephone Encounter (Signed)
done

## 2011-08-28 ENCOUNTER — Telehealth: Payer: Self-pay

## 2011-08-28 NOTE — Telephone Encounter (Signed)
It can take up to a week for her sinus congeston to improve but if she has new symptoms, such as eye congestion despite being on Cefdinir 300 mg BID, she should RTC for re-evaluation.

## 2011-08-28 NOTE — Telephone Encounter (Signed)
Pt is requesting a call back regarding treatment for sinus infection.  She is feeling worse than before.

## 2011-08-28 NOTE — Telephone Encounter (Signed)
Pt states that she is feeling worse than when she was here last week.  She woke up this morning and eyes were glued shut and has green drainage and her nose is very congested.  She tried using the netti pot and it did not work.  She has also been taking her antibiotic, Mucinex, nasal spray, Allegra, nasal saline.  Please advise on what to do next.  Pt sounded like she was going to cry.

## 2011-08-29 MED ORDER — OFLOXACIN 0.3 % OP SOLN: 1.0000 [drp] | Freq: Four times a day (QID) | OPHTHALMIC | Status: AC

## 2011-08-29 MED ORDER — LEVOFLOXACIN 500 MG PO TABS: 500.0000 mg | ORAL_TABLET | Freq: Every day | ORAL | Status: AC

## 2011-08-29 NOTE — Telephone Encounter (Signed)
Discussed case with patient. She has a persistent purulent green postnasal drip. She has significant facial congestion and purulent drainage from both eyes. Will change to Levaquin 500 one daily x10 days and Ocuflox 1-2 drops each 4 times a day P. [facility] [address]

## 2011-08-29 NOTE — Telephone Encounter (Signed)
Patient was here for a sinus infection last week and is worst don't want to come back to clinic want Dr Cleta Alberts to give her a call back only dont want to speak with anyone else patient also want a new RX for infection

## 2011-09-04 ENCOUNTER — Other Ambulatory Visit: Payer: Self-pay | Admitting: Internal Medicine

## 2011-09-07 ENCOUNTER — Telehealth: Payer: Self-pay

## 2011-09-07 NOTE — Telephone Encounter (Signed)
Pt would like for a nurse to contact her she has been vomiting and diarrhea and wants to know how long it will take to pass

## 2011-09-08 NOTE — Telephone Encounter (Signed)
Pt states that she has stopped.  Advised pt to eat a bland diet

## 2011-10-19 ENCOUNTER — Other Ambulatory Visit: Payer: Self-pay | Admitting: Physician Assistant

## 2011-10-19 ENCOUNTER — Other Ambulatory Visit: Payer: Self-pay | Admitting: Emergency Medicine

## 2011-10-20 ENCOUNTER — Other Ambulatory Visit: Payer: Self-pay | Admitting: Physician Assistant

## 2011-10-25 ENCOUNTER — Ambulatory Visit (INDEPENDENT_AMBULATORY_CARE_PROVIDER_SITE_OTHER): Payer: BC Managed Care – PPO | Admitting: Physician Assistant

## 2011-10-25 ENCOUNTER — Encounter: Payer: Self-pay | Admitting: Physician Assistant

## 2011-10-25 VITALS — BP 125/72 | HR 73 | Temp 98.3°F | Resp 18 | Ht 63.5 in | Wt 240.0 lb

## 2011-10-25 DIAGNOSIS — R7309 Other abnormal glucose: Secondary | ICD-10-CM

## 2011-10-25 DIAGNOSIS — R739 Hyperglycemia, unspecified: Secondary | ICD-10-CM

## 2011-10-25 DIAGNOSIS — M797 Fibromyalgia: Secondary | ICD-10-CM

## 2011-10-25 DIAGNOSIS — Z0000 Encounter for general adult medical examination without abnormal findings: Secondary | ICD-10-CM

## 2011-10-25 DIAGNOSIS — F411 Generalized anxiety disorder: Secondary | ICD-10-CM

## 2011-10-25 DIAGNOSIS — IMO0001 Reserved for inherently not codable concepts without codable children: Secondary | ICD-10-CM

## 2011-10-25 DIAGNOSIS — M51369 Other intervertebral disc degeneration, lumbar region without mention of lumbar back pain or lower extremity pain: Secondary | ICD-10-CM

## 2011-10-25 DIAGNOSIS — I5189 Other ill-defined heart diseases: Secondary | ICD-10-CM

## 2011-10-25 DIAGNOSIS — M5030 Other cervical disc degeneration, unspecified cervical region: Secondary | ICD-10-CM

## 2011-10-25 DIAGNOSIS — M51379 Other intervertebral disc degeneration, lumbosacral region without mention of lumbar back pain or lower extremity pain: Secondary | ICD-10-CM

## 2011-10-25 DIAGNOSIS — K589 Irritable bowel syndrome without diarrhea: Secondary | ICD-10-CM

## 2011-10-25 DIAGNOSIS — R5383 Other fatigue: Secondary | ICD-10-CM

## 2011-10-25 DIAGNOSIS — R5381 Other malaise: Secondary | ICD-10-CM

## 2011-10-25 DIAGNOSIS — R7989 Other specified abnormal findings of blood chemistry: Secondary | ICD-10-CM

## 2011-10-25 DIAGNOSIS — E039 Hypothyroidism, unspecified: Secondary | ICD-10-CM

## 2011-10-25 DIAGNOSIS — I10 Essential (primary) hypertension: Secondary | ICD-10-CM

## 2011-10-25 DIAGNOSIS — G4733 Obstructive sleep apnea (adult) (pediatric): Secondary | ICD-10-CM

## 2011-10-25 DIAGNOSIS — J449 Chronic obstructive pulmonary disease, unspecified: Secondary | ICD-10-CM

## 2011-10-25 DIAGNOSIS — M1990 Unspecified osteoarthritis, unspecified site: Secondary | ICD-10-CM

## 2011-10-25 DIAGNOSIS — B009 Herpesviral infection, unspecified: Secondary | ICD-10-CM

## 2011-10-25 DIAGNOSIS — J309 Allergic rhinitis, unspecified: Secondary | ICD-10-CM

## 2011-10-25 DIAGNOSIS — K219 Gastro-esophageal reflux disease without esophagitis: Secondary | ICD-10-CM

## 2011-10-25 DIAGNOSIS — E781 Pure hyperglyceridemia: Secondary | ICD-10-CM

## 2011-10-25 DIAGNOSIS — M5137 Other intervertebral disc degeneration, lumbosacral region: Secondary | ICD-10-CM

## 2011-10-25 DIAGNOSIS — M5136 Other intervertebral disc degeneration, lumbar region: Secondary | ICD-10-CM

## 2011-10-25 DIAGNOSIS — Z9989 Dependence on other enabling machines and devices: Secondary | ICD-10-CM | POA: Insufficient documentation

## 2011-10-25 MED ORDER — ALBUTEROL SULFATE (2.5 MG/3ML) 0.083% IN NEBU: 2.5000 mg | INHALATION_SOLUTION | Freq: Four times a day (QID) | RESPIRATORY_TRACT | Status: DC | PRN

## 2011-10-25 NOTE — Progress Notes (Signed)
Subjective:    Patient ID: Michaela Raymond, female    DOB: 01-26-1956, 56 y.o.   MRN: [id number]  HPI Presents for CPE.  Had breast and pap with Dr. Billy Coast this spring, and a recent normal mammogram.  Additionally, she saw Dr. Talmage Nap a few weeks ago and had labs drawn.  She thought her thyroid was under active, but it was normal.  Her fasting glucose was 130 and she was asked to start checking her home glucose.  She doesn't know what her A1C was, nor what other tests were done, but thinks she had a CBC, lipids, and vitamin D level.  She feels tired.  Is frustrated by her weight.  Marital stress is not new.  Her daughter has been pushing the limits of the rules, requiring lots of oversight to get her homework done, staying up late. The patient feels like she has to stay up until her daughter is tucked in, but that means she's only getting 4-5 hours of sleep each night.  She's getting ready to implement a plan that involves the consequence of loss of the daughter's cell phone if she's not finished her schoolwork by 10 pm, and is hopeful that will help. Is working on scheduling exercise, probably in the pool, in her week.  Hopes to do 45 minutes 5/times weekly, but knows that she may have to work up to that.  Waking is very difficult, and so even getting from the parking lot at the [facility] downtown to the locker rooms and out to the pool is an obstacle.    Review of Systems  Constitutional: Positive for fatigue. Negative for fever, chills, diaphoresis, activity change, appetite change and unexpected weight change (has gained 2 pounds since March 2013).  HENT: Negative.  Voice change: when used Symbicort 2 puffs BID; resolved with 1 puff BID.   Eyes: Negative.   Respiratory: Negative.   Cardiovascular: Negative.   Gastrointestinal: Negative.   Genitourinary: Negative.   Musculoskeletal: Positive for myalgias, back pain, arthralgias and gait problem. Negative for joint swelling.  Skin: Negative.     Neurological: Negative.   Hematological: Negative.   Psychiatric/Behavioral: Positive for dysphoric mood.       Objective:   Physical Exam  Vitals reviewed. Constitutional: She is oriented to person, place, and time. She appears well-developed and well-nourished. She is active. No distress.  HENT:  Head: Normocephalic and atraumatic.  Right Ear: Hearing normal.  Left Ear: Hearing normal.  Eyes: Conjunctivae, EOM and lids are normal. Pupils are equal, round, and reactive to light. Right eye exhibits no discharge and no hordeolum. Left eye exhibits no discharge and no hordeolum. No scleral icterus.  Neck: Normal range of motion. Neck supple. No tracheal deviation present. No mass and no thyromegaly present.  Cardiovascular: Normal rate, regular rhythm, normal heart sounds and intact distal pulses.   Pulmonary/Chest: Effort normal and breath sounds normal. No stridor. No respiratory distress. She has no wheezes. She has no rales.  Abdominal: Soft. Bowel sounds are normal. There is no tenderness.  Lymphadenopathy:    She has no cervical adenopathy.  Neurological: She is alert and oriented to person, place, and time. No cranial nerve deficit. Coordination normal.  Skin: Skin is warm and dry. No rash noted. She is not diaphoretic. No erythema. No pallor.  Psychiatric: She has a normal mood and affect. Her behavior is normal. Judgment and thought content normal.       Tearful.   Results for orders placed in visit  on 10/25/11  GLUCOSE, POCT (MANUAL RESULT ENTRY)      Component Value Range   POC Glucose 106        Assessment & Plan:   1. Routine general medical examination at a health care facility    2. Hyperglycemia  POCT glucose (manual entry)  3. Fatigue    4. Fibromyalgia syndrome    5. DDD (degenerative disc disease), lumbar    6. DDD (degenerative disc disease), cervical    7. GAD (generalized anxiety disorder)    8. HTN (hypertension)    9. OSA on CPAP    10. COPD (chronic  obstructive pulmonary disease)    11. AR (allergic rhinitis)    12. Hypertriglyceridemia    13. GERD (gastroesophageal reflux disease)    14. Unspecified hypothyroidism    15. IBS (irritable bowel syndrome)    16. 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 Diastolic dysfunction     Continue current treatment.  Counseled to proceed with Diabetes Education classes she has scheduled, and to work on adding swimming back into her routine.  We discussed the family dressing rooms at the [facility] with will reduce her walking distance to the pool.  She seems quite motivated to make healthier eating choices.  She will continue seeing her therapist.  I will request the lab results from Dr. Willeen Cass office for our records, and the patient would like a copy, as well.  She will contact the pharmacy when she needs refills-I intend to refill them x 1 year. Intends [facility]

## 2011-10-25 NOTE — Patient Instructions (Addendum)
Fasting should be the lowest reading, and 2-3 hours after the largest meal of the day (post-prandial) should be the highest.  Alternating these times can give you a good idea what your glucose is running.  We recommend your fasting sugar stay below 130, and your 2-3 hour post-prandial sugar stay below 170.  It's a good idea to check your glucose when you feel ill, as well.  Figure out ways to get in no/low impact exercise.  Keeping You Healthy  Get These Tests  Blood Pressure- Have your blood pressure checked by your healthcare provider at least once a year.  Normal blood pressure is 120/80.  Weight- Have your body mass index (BMI) calculated to screen for obesity.  BMI is a measure of body fat based on height and weight.  You can calculate your own BMI at https://www.west-esparza.com/  Cholesterol- Have your cholesterol checked every year.  Diabetes- Have your blood sugar checked every year if you have high blood pressure, high cholesterol, a family history of diabetes or if you are overweight.  Pap Smear- Have a pap smear every 1 to 3 years if you have been sexually active.  If you are older than 65 and recent pap smears have been normal you may not need additional pap smears.  In addition, if you have had a hysterectomy  For benign disease additional pap smears are not necessary.  Mammogram-Yearly mammograms are essential for early detection of breast cancer  Screening for Colon Cancer- Colonoscopy starting at age 70. Screening may begin sooner depending on your family history and other health conditions.  Follow up colonoscopy as directed by your Gastroenterologist.  Screening for Osteoporosis- Screening begins at age 48 with bone density scanning, sooner if you are at higher risk for developing Osteoporosis.  Get these medicines  Calcium with Vitamin D- Your body requires 1200-1500 mg of Calcium a day and [phone] IU of Vitamin D a day.  You can only absorb 500 mg of Calcium at a time  therefore Calcium must be taken in 2 or 3 separate doses throughout the day.  Hormones- Hormone therapy has been associated with increased risk for certain cancers and heart disease.  Talk to your healthcare provider about if you need relief from menopausal symptoms.  Aspirin- Ask your healthcare provider about taking Aspirin to prevent Heart Disease and Stroke.  Get these Immuniztions  Flu shot- Every fall  Pneumonia shot- Once after the age of 91; if you are younger ask your healthcare provider if you need a pneumonia shot.  Tetanus- Every ten years.  Zostavax- Once after the age of 31 to prevent shingles.  Take these steps  Don't smoke- Your healthcare provider can help you quit. For tips on how to quit, ask your healthcare provider or go to www.smokefree.gov or call 1-800 QUIT-NOW.  Be physically active- Exercise 5 days a week for a minimum of 30 minutes.  If you are not already physically active, start slow and gradually work up to 30 minutes of moderate physical activity.  Try walking, dancing, bike riding, swimming, etc.  Eat a healthy diet- Eat a variety of healthy foods such as fruits, vegetables, whole grains, low fat milk, low fat cheeses, yogurt, lean meats, chicken, fish, eggs, dried beans, tofu, etc.  For more information go to www.thenutritionsource.org  Dental visit- Brush and floss teeth twice daily; visit your dentist twice a year.  Eye exam- Visit your Optometrist or Ophthalmologist yearly.  Drink alcohol in moderation- Limit alcohol intake to one  drink or less a day.  Never drink and drive.  Depression- Your emotional health is as important as your physical health.  If you're feeling down or losing interest in things you normally enjoy, please talk to your healthcare provider.  Seat Belts- can save your life; always wear one  Smoke/Carbon Monoxide detectors- These detectors need to be installed on the appropriate level of your home.  Replace batteries at least  once a year.  Violence- If anyone is threatening or hurting you, please tell your healthcare provider.  Living Will/ Health care power of attorney- Discuss with your healthcare provider and family.

## 2011-10-31 ENCOUNTER — Encounter: Payer: BC Managed Care – PPO | Attending: Endocrinology | Admitting: *Deleted

## 2011-10-31 ENCOUNTER — Encounter: Payer: Self-pay | Admitting: *Deleted

## 2011-10-31 DIAGNOSIS — Z713 Dietary counseling and surveillance: Secondary | ICD-10-CM | POA: Insufficient documentation

## 2011-10-31 DIAGNOSIS — E119 Type 2 diabetes mellitus without complications: Secondary | ICD-10-CM | POA: Insufficient documentation

## 2011-10-31 NOTE — Progress Notes (Addendum)
  Patient was seen on 10/31/2011 for the first of a series of three diabetes self-management courses at the [facility]. The following learning objectives were met by the patient during this course:   Defines the role of glucose and insulin  Identifies type of diabetes and pathophysiology  Defines the diagnostic criteria for diabetes and prediabetes  States the risk factors for Type 2 Diabetes  States the symptoms of Type 2 Diabetes  Defines Type 2 Diabetes treatment goals  Defines Type 2 Diabetes treatment options  States the rationale for glucose monitoring  Identifies A1C, glucose targets, and testing times  Identifies proper sharps disposal  Defines the purpose of a diabetes food plan  Identifies carbohydrate food groups  Defines effects of carbohydrate foods on glucose levels  Identifies carbohydrate choices/grams/food labels  States benefits of physical activity and effect on glucose  Review of suggested activity guidelines  Lab Results  Component Value Date   HGBA1C  Value: 6.2  05/02/2010   Handouts given during class include:  Type 2 Diabetes: Basics Book  My Food Plan Book  Food and Activity Log  Patient has established the following initial goals:  Increase exercise/lose weight  Work on stress levels  Follow a diabetes meal plan  Monitor blood glucose  Take medications appropriately  Stop smoking  Keep doctors' appointments  Get medical/lab tests done as requested  Follow-Up Plan: Patient will attend Core Diabetes Courses II and III as scheduled or follow up prn.

## 2011-10-31 NOTE — Patient Instructions (Signed)
Patient will attend Core Diabetes Courses II and III as scheduled or follow up prn.  

## 2011-11-09 ENCOUNTER — Other Ambulatory Visit: Payer: Self-pay | Admitting: Emergency Medicine

## 2011-11-16 ENCOUNTER — Other Ambulatory Visit: Payer: Self-pay | Admitting: Physician Assistant

## 2011-11-18 ENCOUNTER — Telehealth: Payer: Self-pay | Admitting: *Deleted

## 2011-11-18 MED ORDER — NYSTATIN 100000 UNIT/ML MT SUSP: OROMUCOSAL | Status: DC

## 2011-11-18 NOTE — Telephone Encounter (Signed)
Pt states she had a shot in her back and now she thinks that she has thrush.  Per Dr Cleta Alberts she can get mycostatin

## 2011-11-24 ENCOUNTER — Other Ambulatory Visit: Payer: Self-pay | Admitting: Emergency Medicine

## 2011-11-29 ENCOUNTER — Ambulatory Visit: Payer: BC Managed Care – PPO

## 2011-12-06 ENCOUNTER — Ambulatory Visit: Payer: BC Managed Care – PPO

## 2011-12-07 ENCOUNTER — Other Ambulatory Visit: Payer: Self-pay | Admitting: Physician Assistant

## 2012-01-10 ENCOUNTER — Encounter: Payer: BC Managed Care – PPO | Attending: Endocrinology | Admitting: *Deleted

## 2012-01-10 DIAGNOSIS — R7309 Other abnormal glucose: Secondary | ICD-10-CM

## 2012-01-10 DIAGNOSIS — E119 Type 2 diabetes mellitus without complications: Secondary | ICD-10-CM | POA: Insufficient documentation

## 2012-01-10 DIAGNOSIS — Z713 Dietary counseling and surveillance: Secondary | ICD-10-CM | POA: Insufficient documentation

## 2012-01-16 ENCOUNTER — Encounter: Payer: Self-pay | Admitting: *Deleted

## 2012-01-16 NOTE — Patient Instructions (Signed)
Goals:  Follow Diabetes Meal Plan as instructed  Eat 3 meals and 2 snacks, every 3-5 hrs  Limit carbohydrate intake to 30-45 grams carbohydrate/meal  Limit carbohydrate intake to 0-15 grams carbohydrate/snack  Add lean protein foods to meals/snacks  Monitor glucose levels as instructed by your doctor  Aim for 15-30 mins of physical activity daily  Bring food record and glucose log to your next nutrition visit   

## 2012-01-16 NOTE — Progress Notes (Signed)
  Patient was seen on 01/10/2012 for the second of a series of three diabetes self-management courses at the [facility]. The following learning objectives were met by the patient during this course:   Explain basic nutrition maintenance and quality assurance  Describe causes, symptoms and treatment of hypoglycemia and hyperglycemia  Explain how to manage diabetes during illness  Describe the importance of good nutrition for health and healthy eating strategies  List strategies to follow meal plan when dining out  Describe the effects of alcohol on glucose and how to use it safely  Describe problem solving skills for day-to-day glucose challenges  Describe strategies to use when treatment plan needs to change  Identify important factors involved in successful weight loss  Describe ways to remain physically active  Describe the impact of regular activity on insulin resistance  Handouts given in class:  Refrigerator magnet for Sick Day Guidelines  [facility] Oral medication/insulin handout  Follow-Up Plan: Patient will attend the final class of the ADA Diabetes Self-Care Education.    

## 2012-01-17 ENCOUNTER — Other Ambulatory Visit: Payer: Self-pay | Admitting: Emergency Medicine

## 2012-01-18 ENCOUNTER — Encounter: Payer: Self-pay | Admitting: Physician Assistant

## 2012-01-18 DIAGNOSIS — M5137 Other intervertebral disc degeneration, lumbosacral region: Secondary | ICD-10-CM

## 2012-01-22 ENCOUNTER — Telehealth: Payer: Self-pay

## 2012-01-22 NOTE — Telephone Encounter (Signed)
Please call patient she does need to see me for medical clearance.

## 2012-01-22 NOTE — Telephone Encounter (Signed)
Patient dropped off form to be filled out by Dr. Cleta Alberts for bariatric surgery. Please fax to [phone] when done and then call pt once done.

## 2012-01-22 NOTE — Telephone Encounter (Signed)
Form is for medical clearance for Bariatric surgery, can you fill out clearance? I think she needs to be seen for this Amy

## 2012-01-23 ENCOUNTER — Telehealth: Payer: Self-pay | Admitting: Radiology

## 2012-01-23 NOTE — Telephone Encounter (Signed)
Dr Cleta Alberts has to see her for medical clearance but she wants [place] to go ahead and send records over now for her surg/ then we will send clearance letter when Dr Cleta Alberts sees her.

## 2012-01-23 NOTE — Telephone Encounter (Signed)
I have spoken to patient concerning her clearance for bariatric surgery she has sent over medical records release to her surgeon, she wants to know if it has been received and if we can let her know when records sent over.

## 2012-01-23 NOTE — Telephone Encounter (Signed)
Called patient she is advised she does need to be seen for this, she wants to make an appt for this I have had someone pick up her call to schedule

## 2012-01-23 NOTE — Telephone Encounter (Signed)
LMOM regarding message below that Dr Cleta Alberts has to fill out a form from the [facility] before I can send records.  I explained that the form has to filled out and then I have to send the form, letter, and records together.

## 2012-01-23 NOTE — Telephone Encounter (Signed)
I had to place the chart back into Dr.Daub's box due to [facility] needed him to fill out a paper and then the records will be sent over to them.

## 2012-01-24 ENCOUNTER — Encounter: Payer: BC Managed Care – PPO | Attending: Endocrinology

## 2012-01-24 DIAGNOSIS — Z713 Dietary counseling and surveillance: Secondary | ICD-10-CM | POA: Insufficient documentation

## 2012-01-24 DIAGNOSIS — E119 Type 2 diabetes mellitus without complications: Secondary | ICD-10-CM | POA: Insufficient documentation

## 2012-01-25 ENCOUNTER — Ambulatory Visit (INDEPENDENT_AMBULATORY_CARE_PROVIDER_SITE_OTHER): Payer: BC Managed Care – PPO | Admitting: Emergency Medicine

## 2012-01-25 VITALS — BP 128/80 | HR 76 | Temp 97.6°F | Resp 16 | Ht 63.75 in | Wt 228.4 lb

## 2012-01-25 DIAGNOSIS — E669 Obesity, unspecified: Secondary | ICD-10-CM

## 2012-01-25 DIAGNOSIS — G4733 Obstructive sleep apnea (adult) (pediatric): Secondary | ICD-10-CM

## 2012-01-25 DIAGNOSIS — E785 Hyperlipidemia, unspecified: Secondary | ICD-10-CM

## 2012-01-25 DIAGNOSIS — I10 Essential (primary) hypertension: Secondary | ICD-10-CM

## 2012-01-25 DIAGNOSIS — E119 Type 2 diabetes mellitus without complications: Secondary | ICD-10-CM

## 2012-01-25 NOTE — Progress Notes (Signed)
  Subjective:    Patient ID: Michaela Raymond, female    DOB: 1956/05/09, 56 y.o.   MRN: [id number]  HPI patient here for evaluation prior to undergoing bypass surgery. She has multiple medical problems which include obesity, diabetes, obstructive sleep apnea, and nonobstructive coronary disease. She has had multiple orthopedic issues regarding her knees and back. She is a heavy smoker but has committed herself to quit currently using nicotine patches. Her catheterization was done in November 2011 which showed mild diastolic dysfunction and nonobstructive coronary disease. She is compliant with her CPAP machine.    Review of Systems     Objective:   Physical Exam HEENT exam is normal neck is supple chest is clear cardiac exam reveals a 1/6 systolic murmur at the base of the heart. Abdomen is soft and no tenderness.        Assessment & Plan:  All medical problems are currently under treatment and stable at the present time. I had initially suggested cardiac approval from her cardiologist however she is no longer seeing him. She did have a catheterization on followup from November 2011. This may be sufficient to proceed with surgery. She does use a CPAP machine. She has started her Nicoderm patches.

## 2012-02-14 ENCOUNTER — Other Ambulatory Visit: Payer: Self-pay | Admitting: Physician Assistant

## 2012-03-20 ENCOUNTER — Ambulatory Visit (INDEPENDENT_AMBULATORY_CARE_PROVIDER_SITE_OTHER): Payer: BC Managed Care – PPO | Admitting: Emergency Medicine

## 2012-03-20 VITALS — BP 132/86 | HR 88 | Temp 98.1°F | Resp 18 | Ht 63.0 in | Wt 210.0 lb

## 2012-03-20 DIAGNOSIS — D649 Anemia, unspecified: Secondary | ICD-10-CM

## 2012-03-20 LAB — COMPREHENSIVE METABOLIC PANEL
Albumin: 4.4 g/dL (ref 3.5–5.2)
Alkaline Phosphatase: 61 U/L (ref 39–117)
BUN: 9 mg/dL (ref 6–23)
CO2: 26 mEq/L (ref 19–32)
Glucose, Bld: 97 mg/dL (ref 70–99)
Sodium: 140 mEq/L (ref 135–145)
Total Bilirubin: 0.7 mg/dL (ref 0.3–1.2)
Total Protein: 6.4 g/dL (ref 6.0–8.3)

## 2012-03-20 LAB — POCT CBC
HCT, POC: 34.5 % — AB (ref 37.7–47.9)
Hemoglobin: 10.5 g/dL — AB (ref 12.2–16.2)
Lymph, poc: 2.2 (ref 0.6–3.4)
MCHC: 30.4 g/dL — AB (ref 31.8–35.4)
MCV: 89.9 fL (ref 80–97)
POC LYMPH PERCENT: 24.8 %L (ref 10–50)
RDW, POC: 16.7 %
WBC: 8.9 10*3/uL (ref 4.6–10.2)

## 2012-03-20 NOTE — Progress Notes (Signed)
  Subjective:    Patient ID: AL NOURY, female    DOB: [birth year], 56 y.o.   MRN: [id number]  HPI Pt presents to clinic today to follow up after her gastric bypass surgery done 03/04/12. She states that when she was discharged from the hospital her hemoglobin was down to 8.0. Three days later she had her hemoglobin rechecked and it was 8.9. She stated right after she got home from being discharged, she initially felt restless, irritable, and even suicidal. Her doctor prescribed her Ativan to use at night to help her sleep for a few nights. She states now she is doing a lot better. Rechecking her BP in the room now: 122/78.   Dr. Franki Cabot did Pt's surgery- fax number to fax CMET over is [phone]  Review of Systems     Objective:   Physical Exam patient looks good she is alert and cooperative and in no distress neck is supple chest is clear heart regular rate no murmurs abdomen is soft the ports are well healed there is bruising of the lower abdomen there is no tenderness  Results for orders placed in visit on 03/20/12  POCT CBC      Component Value Range   WBC 8.9  4.6 - 10.2 K/uL   Lymph, poc 2.2  0.6 - 3.4   POC LYMPH PERCENT 24.8  10 - 50 %L   MID (cbc) 0.5  0 - 0.9   POC MID % 5.8  0 - 12 %M   POC Granulocyte 6.2  2 - 6.9   Granulocyte percent 69.4  37 - 80 %G   RBC 3.84 (*) 4.04 - 5.48 M/uL   Hemoglobin 10.5 (*) 12.2 - 16.2 g/dL   HCT, POC 14.7 (*) 82.9 - 47.9 %   MCV 89.9  80 - 97 fL   MCH, POC 27.3  27 - 31.2 pg   MCHC 30.4 (*) 31.8 - 35.4 g/dL   RDW, POC 56.2     Platelet Count, POC 520 (*) 142 - 424 K/uL   MPV 7.1  0 - 99.8 fL        Assessment & Plan:  Patient looks good the day her hemoglobin is up to 10.5 and hopefully with vitamin supplement and patient will continue to improve. Send copy of Cmet to her Dr.

## 2012-04-24 ENCOUNTER — Other Ambulatory Visit: Payer: Self-pay | Admitting: Physician Assistant

## 2012-05-01 ENCOUNTER — Other Ambulatory Visit: Payer: Self-pay | Admitting: Surgery

## 2012-05-09 ENCOUNTER — Other Ambulatory Visit: Payer: Self-pay | Admitting: Physician Assistant

## 2012-06-02 ENCOUNTER — Ambulatory Visit (INDEPENDENT_AMBULATORY_CARE_PROVIDER_SITE_OTHER): Payer: BC Managed Care – PPO | Admitting: Radiology

## 2012-06-02 DIAGNOSIS — Z23 Encounter for immunization: Secondary | ICD-10-CM

## 2012-08-26 ENCOUNTER — Ambulatory Visit (INDEPENDENT_AMBULATORY_CARE_PROVIDER_SITE_OTHER): Payer: BC Managed Care – PPO | Admitting: Physician Assistant

## 2012-08-26 VITALS — BP 138/78 | HR 56 | Temp 97.8°F | Resp 16 | Ht 64.0 in | Wt 148.0 lb

## 2012-08-26 DIAGNOSIS — R059 Cough, unspecified: Secondary | ICD-10-CM

## 2012-08-26 DIAGNOSIS — J019 Acute sinusitis, unspecified: Secondary | ICD-10-CM

## 2012-08-26 MED ORDER — AMOXICILLIN 250 MG/5ML PO SUSR: 500.0000 mg | Freq: Three times a day (TID) | ORAL | Status: DC

## 2012-08-26 MED ORDER — HYDROCODONE-HOMATROPINE 5-1.5 MG/5ML PO SYRP: ORAL_SOLUTION | ORAL | Status: DC

## 2012-08-26 NOTE — Progress Notes (Signed)
Patient ID: Michaela Raymond MRN: [id number], DOB: 1955/09/25, 57 y.o. Date of Encounter: 08/26/2012, 4:02 PM  Primary Physician: Lucilla Edin, MD  Chief Complaint:  Chief Complaint  Patient presents with  . Sinusitis    x 1 week    HPI: 57 y.o. year old female presents with a seven day history of nasal congestion, post nasal drip, sore throat, sinus pressure, and cough. Subjective fever and chills. Nasal congestion thick and green/yellow. Sinus pressure is the worst symptom along the bilateral frontal sinuses. Cough is not productive and secondary to post nasal drip. Seems to be worse first thing in the morning and at night. It is sporadic throughout the day. No shortness of breath or wheezing. Ears feel full and she has some left otalgia. Normal hearing. Has tried OTC cold preps without success. No GI complaints. Appetite slightly decreased, although she has recently undergone Roux-en-y gastric bypass and is doing quite well with this. No recent antibiotics, recent travels, or sick contacts. No leg trauma, sedentary periods, h/o cancer, or current tobacco use.  Past Medical History  Diagnosis Date  . Dysrhythmia   . Angina   . Heart murmur   . Asthma   . Hypertension   . COPD (chronic obstructive pulmonary disease)   . Bronchitis   . Pneumonia   . Shortness of breath   . Sleep apnea     cpap machine x 9 years  . Hypothyroidism   . Adenoma     of thyroid gland, benign  . Chronic kidney disease     cyst on right kidney hx of  . Barrett's esophagus   . GERD (gastroesophageal reflux disease)   . Headache   . Fibromyalgia   . Arthritis     osteoarthrits  . Depression   . Anxiety   . Diabetes mellitus      Home Meds: Prior to Admission medications   Medication Sig Start Date End Date Taking? Authorizing Provider  acetaminophen (TYLENOL) 650 MG CR tablet Take 650 mg by mouth every morning. For arthitis    Yes Historical Provider, MD  albuterol (PROVENTIL) (2.5 MG/3ML)  0.083% nebulizer solution Take 3 mLs (2.5 mg total) by nebulization every 6 (six) hours as needed for wheezing or shortness of breath. 10/25/11 10/24/12 Yes Chelle S Jeffery, PA-C  alosetron (LOTRONEX) 0.5 MG tablet Take 0.5 mg by mouth at bedtime.     Yes Historical Provider, MD  ALPRAZolam Prudy Feeler) 0.5 MG tablet Take 0.25 mg by mouth at bedtime as needed. For sleep    Yes Historical Provider, MD  budesonide-formoterol (SYMBICORT) 80-4.5 MCG/ACT inhaler Inhale 2 puffs into the lungs 2 (two) times daily.     Yes Historical Provider, MD  fluticasone (FLONASE) 50 MCG/ACT nasal spray INHALE 2 PUFFS INTO EACH NOSTRIL DAILY 05/09/12  Yes Ryan M Dunn, PA-C  gabapentin (NEURONTIN) 300 MG capsule Take 300 mg by mouth 2 (two) times daily.     Yes Historical Provider, MD  levothyroxine (SYNTHROID, LEVOTHROID) 137 MCG tablet Take 137 mcg by mouth every morning.     Yes Historical Provider, MD  LIDODERM 5 % APPLY 1 PATCH FOR 12 OUT OF 24 HOURS 08/16/11  Yes Morrell Riddle, PA-C  losartan (COZAAR) 100 MG tablet TAKE 1 TABLET (100 MG TOTAL) BY MOUTH DAILY. NEEDS OFFICE VISIT FOR FURTHER REFILLS 02/14/12  Yes Ryan M Dunn, PA-C  montelukast (SINGULAIR) 10 MG tablet TAKE 1 TABLETBY MOUTH  AT BEDTIME 04/24/12  Yes Sondra Barges,  PA-C  Multiple Vitamins-Minerals (ICAPS MV PO) Take 1 capsule by mouth daily.     Yes Historical Provider, MD  omeprazole (PRILOSEC) 10 MG capsule Take 10 mg by mouth 2 (two) times daily.   Yes Historical Provider, MD  Alum & Mag Hydroxide-Simeth (MAGIC MOUTHWASH W/LIDOCAINE) SOLN Take 5 mLs by mouth 3 (three) times daily as needed. 08/24/11   Chelle Tessa Lerner, PA-C  CELEBREX 200 MG capsule TAKE 1 CAPSULE  DAILY 11/24/11   Sondra Barges, PA-C  desoximetasone (TOPICORT) 0.25 % cream Apply 1 application topically daily.    Historical Provider, MD  diltiazem (DILACOR XR) 180 MG 24 hr capsule Take 180 mg by mouth at bedtime.      Historical Provider, MD  HYDROcodone-acetaminophen (NORCO) 5-325 MG per tablet Take  0.5-1 tablets by mouth every 6 (six) hours as needed. For severe pain     Historical Provider, MD  ipratropium (ATROVENT) 0.03 % nasal spray Place 2 sprays into the nose 2 (two) times daily. 08/21/11 08/20/12  Chelle S Jeffery, PA-C  metFORMIN (GLUCOPHAGE) 500 MG tablet Take 500 mg by mouth.    Historical Provider, MD  nystatin (MYCOSTATIN) 100000 UNIT/ML suspension Take 1 teaspoonful swish and spit 4 times a day 11/18/11   Elvina Sidle, MD  OVER THE COUNTER MEDICATION Take 1 capsule by mouth daily. Super quercetin (nutritional supplement for breathing/ lungs)     Historical Provider, MD  pantoprazole (PROTONIX) 40 MG tablet Take 40 mg by mouth every evening.      Historical Provider, MD  SYMBICORT 160-4.5 MCG/ACT inhaler USE 2 PUFFS TWICE A DAY. RINSE MOUTH AFTER USE 10/20/11   Chelle S Jeffery, PA-C  traMADol (ULTRAM) 50 MG tablet Take 25-50 mg by mouth daily.      Historical Provider, MD  triamterene-hydrochlorothiazide (DYAZIDE) 37.5-25 MG per capsule TAKE ONE CAPSULE BY MOUTH DAILY AS DIRECTED FOR FOR HIGH BLOOD PRESSURE 12/07/11   Pattricia Boss, PA-C  triamterene-hydrochlorothiazide (MAXZIDE-25) 37.5-25 MG per tablet Take 1 tablet by mouth every morning.      Historical Provider, MD  valACYclovir (VALTREX) 500 MG tablet Take 500 mg by mouth at bedtime.      Historical Provider, MD    Allergies:  Allergies  Allergen Reactions  . Sulfa Antibiotics Anaphylaxis, Hives, Itching and Swelling  . Coreg   . Escitalopram Oxalate     Panic attack   . Fenofibrate   . Ibuprofen   . Talwin (Pentazocine Lactate) Nausea And Vomiting  . Tape Itching    Hives. Only use paper or cloth tape  . Verapamil   . Zithromax (Azithromycin)     Severe Abdominal cramping    History   Social History  . Marital Status: Married    Spouse Name: N/A    Number of Children: 3  . Years of Education: N/A   Occupational History  . unemployed    Social History Main Topics  . Smoking status: Former Smoker --  0.50 packs/day for 30 years    Start date: 02/19/2012  . Smokeless tobacco: Not on file  . Alcohol Use: Yes     Comment: occassional wine  . Drug Use: No  . Sexually Active: Yes   Other Topics Concern  . Not on file   Social History Narrative  . No narrative on file     Review of Systems: Constitutional: Positive for fatigue, fever, chills, and weight changes (Roux-en-Y). Negative for or night sweats HEENT: see above Cardiovascular: negative for chest pain  or palpitations Respiratory: Positive for cough. Negative for hemoptysis, wheezing, or shortness of breath Abdominal: negative for abdominal pain, nausea, vomiting or diarrhea Dermatological: negative for rash Neurologic: Positive for headache. Negative for dizziness or vertigo   Physical Exam: Blood pressure 138/78, pulse 56, temperature 97.8 F (36.6 C), temperature source Oral, resp. rate 16, height 5\' 4"  (1.626 m), weight 148 lb (67.132 kg), SpO2 98.00%., Body mass index is 25.39 kg/(m^2). General: Well developed, well nourished, in no acute distress. Head: Normocephalic, atraumatic, eyes without discharge, sclera non-icteric, nares are congested. Bilateral auditory canals clear, TM's are without perforation, pearly grey with reflective cone of light bilaterally. Serous effusion behind left TM. Frontal sinus TTP. Oral cavity moist, dentition normal. Posterior pharynx with post nasal drip. No peritonsillar abscess, erythema, or tonsillar exudate. Uvula midline.  Neck: Supple. No thyromegaly. Full ROM. No lymphadenopathy. Lungs: Clear bilaterally to auscultation without wheezes, rales, or rhonchi. Breathing is unlabored.  Heart: RRR with S1 S2. No murmurs, rubs, or gallops appreciated. Msk:  Strength and tone normal for age. Extremities: No clubbing or cyanosis. No edema. Neuro: Alert and oriented X 3. Moves all extremities spontaneously. CNII-XII grossly in tact. Psych:  Responds to questions appropriately with a normal  affect.     ASSESSMENT AND PLAN:  57 y.o. female with sinusitis and cough secondary to post nasal drip -Amoxicillin 250/5 mL Take 10 mL po tid for 10 days #300 mL no RF -Hycodan #4oz 1 tsp po q 4-6 hours prn cough no RF SED -Mucinex -Tylenol/Motrin prn -Rest/fluids -RTC precautions -RTC 3-5 days if no improvement  Elinor Dodge, PA-C 08/26/2012 4:02 PM

## 2012-09-11 ENCOUNTER — Other Ambulatory Visit: Payer: Self-pay | Admitting: Physician Assistant

## 2012-09-11 NOTE — Telephone Encounter (Signed)
Needs OV to discuss for more

## 2012-09-15 ENCOUNTER — Telehealth: Payer: Self-pay | Admitting: Radiology

## 2012-09-15 ENCOUNTER — Telehealth: Payer: Self-pay

## 2012-09-15 DIAGNOSIS — B0229 Other postherpetic nervous system involvement: Secondary | ICD-10-CM

## 2012-09-15 NOTE — Telephone Encounter (Signed)
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 Michaela Raymond

## 2012-09-15 NOTE — Telephone Encounter (Signed)
Lidoderm was sent in on 09/11/12 called her to advise, left message.

## 2012-09-15 NOTE — Telephone Encounter (Signed)
Pt says she called last week for her refill on patches and the pharmacy has not heard anything from [place] please call her at [phone]

## 2012-11-22 ENCOUNTER — Other Ambulatory Visit: Payer: Self-pay | Admitting: Physician Assistant

## 2012-11-24 ENCOUNTER — Telehealth: Payer: Self-pay | Admitting: Radiology

## 2012-11-24 MED ORDER — LIDOCAINE 5 % EX PTCH: 1.0000 | MEDICATED_PATCH | CUTANEOUS | Status: DC

## 2012-11-24 NOTE — Telephone Encounter (Signed)
Dr Cleta Alberts, do you want to RF for pt or need an OV first?

## 2012-11-24 NOTE — Telephone Encounter (Signed)
Patient uses the Lidoderm for post herpetic neuralgia, she has been using them since 2005. I called patient to advise this is sent in for her, she has had no problems with the patch to you [facility]

## 2013-01-09 ENCOUNTER — Other Ambulatory Visit: Payer: Self-pay | Admitting: Physician Assistant

## 2013-01-28 ENCOUNTER — Telehealth: Payer: Self-pay

## 2013-01-28 NOTE — Telephone Encounter (Signed)
I am not a nurse or PA , called patient. She states her daughters were exposed to head Lice in June/ everyone treated for this. Now her ex husband has pubic lice, advised this is different from head lice and he should come in.

## 2013-01-28 NOTE — Telephone Encounter (Signed)
Patient would like a nurse or pa to call her with questions about lice [phone]

## 2013-02-12 ENCOUNTER — Other Ambulatory Visit: Payer: Self-pay | Admitting: Physician Assistant

## 2013-03-31 ENCOUNTER — Ambulatory Visit (INDEPENDENT_AMBULATORY_CARE_PROVIDER_SITE_OTHER): Payer: BC Managed Care – PPO | Admitting: Cardiology

## 2013-03-31 ENCOUNTER — Encounter: Payer: Self-pay | Admitting: Cardiology

## 2013-03-31 VITALS — BP 124/80 | HR 51 | Ht 63.5 in | Wt 135.6 lb

## 2013-03-31 DIAGNOSIS — E785 Hyperlipidemia, unspecified: Secondary | ICD-10-CM

## 2013-03-31 DIAGNOSIS — Z9989 Dependence on other enabling machines and devices: Secondary | ICD-10-CM

## 2013-03-31 DIAGNOSIS — G4733 Obstructive sleep apnea (adult) (pediatric): Secondary | ICD-10-CM

## 2013-03-31 DIAGNOSIS — I10 Essential (primary) hypertension: Secondary | ICD-10-CM

## 2013-03-31 NOTE — Patient Instructions (Signed)
Your physician wants you to follow-up in 12 MONTHS DR HARDING.  You will receive a reminder letter in the mail two months in advance. If you don't receive a letter, please call our office to schedule the follow-up appointment.  CONTINUE CURRENT MEDICATION

## 2013-04-02 ENCOUNTER — Encounter: Payer: Self-pay | Admitting: Cardiology

## 2013-04-02 DIAGNOSIS — I10 Essential (primary) hypertension: Secondary | ICD-10-CM | POA: Insufficient documentation

## 2013-04-02 DIAGNOSIS — E785 Hyperlipidemia, unspecified: Secondary | ICD-10-CM | POA: Insufficient documentation

## 2013-04-02 NOTE — Assessment & Plan Note (Signed)
Been checked by her primary physician.  Well controlled by her report

## 2013-04-02 NOTE — Progress Notes (Signed)
PATIENT: Michaela Raymond MRN: [id number]  DOB: [birth year]   DOV:04/02/2013 PCP: Lucilla Edin, MD  Clinic Note: Chief Complaint  Patient presents with  . Annual Exam    No complaints. Had gastric bypass surgery in Sept. 2013.   HPI: Michaela Raymond is a 57 y.o. female with a PMH below who presents today for almost one year visit.  She now just over one year now status post gastric bypass surgery.  She has lost a dramatic amount of weight roughly 80 pounds. I did not recognize her when she came in.  I have been following her for her aortic atherosclerosis and hypertension. I met her back in November 2000 1100 cardiac catheterization on her for chest discomfort that was very concerning for unstable angina.  She essentially had a left dominant system with no angiographic evidence of significant disease.  She had normal EF of 65%.  Interval History: She comes in today with a big smile on her face.  She feels great.  She is barely having any issues.  Because of her weight loss she is taking estradiol supplementation.  Intermittent migraines, but she is now off of her diabetes medications.  She had labs 2 weeks ago told it looked pretty good.  She is switched over care to [facility] in [place]. She says she probably doesn't need the use of CPAP, but he did use it just because she is used to it now.  She is asked to cut back from her original and hypertension regimen is only back now on a total of 51 his losartan.  She's been exercising doing swimming and biking and walking at least 2 days a week.  She's also now as she is a year out of work working on history her diet they stable.  The remainder of Cardiovascular ROS: no chest pain or dyspnea on exertion negative for - edema, irregular heartbeat, loss of consciousness, murmur, orthopnea, palpitations, paroxysmal nocturnal dyspnea, rapid heart rate or shortness of breath: Additional cardiac review of systems: Lightheadedness - no, dizziness -  no, syncope/near-syncope - no; TIA/amaurosis fugax - no Melena - no, hematochezia no; hematuria - no; nosebleeds - no; claudication - no  Past Medical History  Diagnosis Date  . Dysrhythmia   . Angina November 2011    Normal coronary angiogram; noncardiac symptom  . Heart murmur   . Asthma   . Hypertension   . COPD (chronic obstructive pulmonary disease)   . Bronchitis   . Pneumonia   . Sleep apnea     cpap machine x 9 years  . Hypothyroidism   . Adenoma     of thyroid gland, benign  . Chronic kidney disease     cyst on right kidney hx of  . Barrett's esophagus   . GERD (gastroesophageal reflux disease)   . Headache(784.0)   . Fibromyalgia   . Arthritis     osteoarthrits  . Depression   . Anxiety   . Diabetes mellitus   . Hyperlipidemia   . Morbid obesity     Status post gastric bypass surgery September 2013   Prior Cardiac Evaluation and Past Surgical History: Past Surgical History  Procedure Laterality Date  . Knee arthroplasty      bilaterally  . Back surgery      spinal fusion  . Thyroidectomy    . Rotator cuff repair      2001  . Joint replacement    . Tonsillectomy  and adnoids removed  . Dilation and curettage of uterus    . Cardiac catheterization  05/03/2010    No significant CAD in L coronary system, normal LV function  . Nasal sinus surgery      1989 and mucous cele removed  . Esophagogastroduodenoscopy    . Colonoscopy    . Cardiovascular stress test  04/15/2007    EKG negative for ischemia, no significant ischemia demonstrated.  . Transthoracic echocardiogram  05/03/2021    EF 55-65%, normal   Allergies  Allergen Reactions  . Sulfa Antibiotics Anaphylaxis, Hives, Itching and Swelling  . Asa [Aspirin]     GI upset  . Coreg   . Escitalopram Oxalate     Panic attack   . Fenofibrate   . Ibuprofen   . Talwin [Pentazocine Lactate] Nausea And Vomiting  . Tape Itching    Hives. Only use paper or cloth tape  . Verapamil   . Zithromax  [Azithromycin]     Severe Abdominal cramping    Current Outpatient Prescriptions  Medication Sig Dispense Refill  . acetaminophen (TYLENOL 8 HOUR) 650 MG CR tablet Take 1,300 mg by mouth 2 (two) times daily.      Marland Kitchen alosetron (LOTRONEX) 0.5 MG tablet Take 0.5 mg by mouth at bedtime.        Marland Kitchen amoxicillin (AMOXIL) 250 MG/5ML suspension Take 500 mg by mouth as needed (for dental procedures.).      Marland Kitchen Biotin 1000 MCG tablet Take 1,000 mcg by mouth 3 (three) times daily.      . Bismuth Subgallate (DEVROM) 200 MG CHEW Chew 1 tablet by mouth 2 (two) times daily.      . butalbital-acetaminophen-caffeine (FIORICET, ESGIC) 50-325-40 MG per tablet Take 1 tablet by mouth as needed.      . calcium-vitamin D (OSCAL WITH D) 500-200 MG-UNIT per tablet Take 1 tablet by mouth daily.      Marland Kitchen desoximetasone (TOPICORT) 0.25 % cream Apply 1 application topically as needed.       Marland Kitchen estradiol (VIVELLE-DOT) 0.1 MG/24HR patch Place 1 patch onto the skin once a week.      . FeFum-FePoly-FA-B Cmp-C-Biot (FOLIVANE-PLUS) CAPS Take 1 capsule by mouth daily.      . fluticasone (FLONASE) 50 MCG/ACT nasal spray SPRAY 2 SPRAYS INTO EACH NOSTRIL DAILY  48 g  0  . gabapentin (NEURONTIN) 300 MG capsule Take 300 mg by mouth daily.       Marland Kitchen levothyroxine (SYNTHROID, LEVOTHROID) 137 MCG tablet Take 137 mcg by mouth every morning.        . lidocaine (LIDODERM) 5 % Place 1-2 patches onto the skin daily. Remove & Discard patch within 12 hours or as directed by MD  30 patch  11  . loratadine (CLARITIN) 5 MG chewable tablet Chew 5 mg by mouth daily.      Marland Kitchen losartan (COZAAR) 25 MG tablet Take 25 mg by mouth daily.      . montelukast (SINGULAIR) 10 MG tablet Take 1 tablet (10 mg total) by mouth at bedtime. PATIENT NEEDS OFFICE VISIT FOR ADDITIONAL REFILLS  90 tablet  0  . Multiple Vitamin (MULTIVITAMIN) tablet Take 1 tablet by mouth daily.      Marland Kitchen omeprazole (PRILOSEC) 20 MG capsule Take 20 mg by mouth 2 (two) times daily.      . ursodiol  (ACTIGALL) 300 MG capsule Take 300 mg by mouth 2 (two) times daily.      Marland Kitchen zinc gluconate 50 MG tablet Take  50 mg by mouth daily.       No current facility-administered medications for this visit.    History   Social History Narrative   She is a separated mother of 3.  2 children from her first marriage and one from the second one.  She has one grandchild.  He she quit smoking in September 2013.  She does not drink.  She exercises at least 2-3 days a week doing either swimming biking or running.    ROS: A comprehensive Review of Systems - Negative except Occasional cramps.  PHYSICAL EXAM BP 124/80  Pulse 51  Ht 5' 3.5" (1.613 m)  Wt 135 lb 9.6 oz (61.508 kg)  BMI 23.64 kg/m2 General appearance: alert, cooperative, appears stated age, no distress; thin, healthy-appearing Neck: no adenopathy, no carotid bruit and no JVD Lungs: clear to auscultation bilaterally, normal percussion bilaterally and non-labored Heart: regular rate and rhythm, S1, S2 normal, soft 1/6 SEM at the RUSB.  Also soft S4., click, rub or gallop; nondisplaced Abdomen: soft, non-tender; bowel sounds normal; no masses,  no organomegaly; residual skin status post weight loss Extremities: extremities normal, atraumatic, no cyanosis or edema Pulses: 2+ and symmetric; Neurologic: Mental status: Alert, oriented, thought content appropriate Cranial nerves: normal (II-XII grossly intact)  NWG:[id number] today: Yes Rate: 51 , Rhythm: Sinus bradycardia, otherwise normal;   Recent Labs: Check by PCP  ASSESSMENT / PLAN: Essential hypertension Well-controlled on ARB.  Hyperlipidemia Been checked by her primary physician.  Well controlled by her report  Morbid obesity - status post gastric bypass surgery Outstanding weight loss now almost 80+ pounds.  Continuing her activity with exercise as well as dietary modification.  Has come off her diabetes medications and the control is much improved.  OSA on CPAP She probably  doesn't need to use CPAP anymore, but feels comfortable with that she continues to use it.   Orders Placed This Encounter  Procedures  . EKG 12-Lead    This order was created through External Result Entry   Followup: One year  Dhyana Bastone W. Herbie Baltimore, M.D., M.S. [facility] [address] [place], [place]  [address]  [phone] Pager # [phone]

## 2013-04-02 NOTE — Assessment & Plan Note (Signed)
She probably doesn't need to use CPAP anymore, but feels comfortable with that she continues to use it.

## 2013-04-02 NOTE — Assessment & Plan Note (Signed)
Outstanding weight loss now almost 80+ pounds.  Continuing her activity with exercise as well as dietary modification.  Has come off her diabetes medications and the control is much improved.

## 2013-04-02 NOTE — Assessment & Plan Note (Signed)
Well-controlled on ARB. 

## 2013-04-05 ENCOUNTER — Other Ambulatory Visit: Payer: Self-pay | Admitting: Physician Assistant

## 2013-06-17 ENCOUNTER — Encounter: Payer: Self-pay | Admitting: Cardiology

## 2013-07-11 ENCOUNTER — Other Ambulatory Visit: Payer: Self-pay | Admitting: Physician Assistant

## 2013-07-14 ENCOUNTER — Other Ambulatory Visit: Payer: Self-pay | Admitting: Physician Assistant

## 2013-08-16 ENCOUNTER — Other Ambulatory Visit: Payer: Self-pay | Admitting: Physician Assistant

## 2013-08-19 ENCOUNTER — Other Ambulatory Visit: Payer: Self-pay | Admitting: Physician Assistant

## 2013-08-21 ENCOUNTER — Other Ambulatory Visit: Payer: Self-pay | Admitting: Physician Assistant

## 2013-09-16 ENCOUNTER — Other Ambulatory Visit: Payer: Self-pay | Admitting: Physician Assistant

## 2013-10-30 ENCOUNTER — Other Ambulatory Visit: Payer: Self-pay | Admitting: Physician Assistant

## 2013-12-14 ENCOUNTER — Other Ambulatory Visit: Payer: Self-pay | Admitting: Emergency Medicine

## 2013-12-21 ENCOUNTER — Other Ambulatory Visit: Payer: Self-pay | Admitting: Emergency Medicine

## 2013-12-23 ENCOUNTER — Telehealth: Payer: Self-pay

## 2013-12-23 ENCOUNTER — Other Ambulatory Visit: Payer: Self-pay

## 2013-12-23 MED ORDER — LIDOCAINE 5 % EX PTCH: 1.0000 | MEDICATED_PATCH | CUTANEOUS | Status: AC

## 2013-12-23 NOTE — Telephone Encounter (Signed)
Patient called and left a voicemail stating she wants specific parts of her record transferred to [facility] in [place]. I returned her call and spoke with her. States that she requested her records be sent to the new facility last Fall but they did not receive any notes regarding her shingles diagnosis. States her new doctor's office will not write her a rx for Lidocaine without having seen our office notes detailing her condition. Patient is out of town right now and would like to request that we pull her chart, search for ANY notes relating to her shingles diagnosis, follow up visits, and exchanges between our phyisicians and [employer] relating to Lidocaine rx (states Dr. Everlene Farrier went to great lengths to have the insurance company approve the rx in the past). Patient would like to be able to pick up her records on Monday when she comes back into town. Advised patient that we would do our best to have them ready, would call her when they were ready, and that she would need to fill out a release form for our records. Patient understood.

## 2013-12-23 NOTE — Telephone Encounter (Signed)
CB # [phone]

## 2013-12-31 NOTE — Telephone Encounter (Signed)
Pulled both volumes of patient chart (volume 1 was in storage). There were only 2 office visits (both from 2010) that referenced Lidoderm patches. There were copies of old prescriptions for the patches as well, but we can't sent copies of prescriptions. The only other place in her chart that referenced the patches was the problem sheet from volume 2. Will call patient tomorrow to get clarification on request because the voicemail requested records related to shingles diagnosis (which wasn't in her chart) and the release form requests records from [phone]. Chart is very extensive and hopefully we can narrow down what she needs.

## 2014-01-01 NOTE — Telephone Encounter (Signed)
Left message for patient to return call in regards to records request.

## 2014-01-01 NOTE — Telephone Encounter (Signed)
Spoke with patient to get clarification on her records request. I let her know that usually when providers request records for a patient transfer they usually ask for the last 3-5 years, especially if the chart is extensive like hers. Patient stated that it sounded like I "didn't feel like" sending all of her records. I told her I would be happy to send it all, but it will take some time being that she has 2 volumes of records in her paper chart and records in epic. Then I told her I was unable to find any office notes related to a shingles diagnoses and I only saw 2 office visits that referenced lidoderm patches after looking thru both paper volumes of records. Patient got upset and says she knows it's in her chart because Dr. Elder Cyphers diagnosed her. Then she said Dr. Everlene Farrier knows where to find it since he had to find it before. I told her I would look again in case I overlooked it yesterday but that wasn't good enough. So I told her I would put both volumes of charts in Dr. Perfecto Kingdom box and have him find what she wants then I will process her request accordingly with sending the last 3 years of records.

## 2014-01-04 NOTE — Telephone Encounter (Signed)
Michaela Raymond will ask Dr. Everlene Farrier about records that patient is requesting related to shingles diagnosis. Says she will call patient after discussing with Dr. Everlene Farrier.

## 2014-01-05 NOTE — Telephone Encounter (Signed)
Clayborn Bigness spoke with patient. Patient verbally verified that she wants records from the last 3 years. Processed request and informed patient that records were ready for pick-up at [address]. Patient understood.

## 2014-02-10 ENCOUNTER — Other Ambulatory Visit: Payer: Self-pay | Admitting: Gastroenterology

## 2014-07-07 ENCOUNTER — Encounter: Payer: Self-pay | Admitting: Cardiology

## 2014-07-07 ENCOUNTER — Ambulatory Visit (INDEPENDENT_AMBULATORY_CARE_PROVIDER_SITE_OTHER): Payer: BC Managed Care – PPO | Admitting: Cardiology

## 2014-07-07 DIAGNOSIS — Z9989 Dependence on other enabling machines and devices: Secondary | ICD-10-CM

## 2014-07-07 DIAGNOSIS — G4733 Obstructive sleep apnea (adult) (pediatric): Secondary | ICD-10-CM

## 2014-07-07 DIAGNOSIS — I10 Essential (primary) hypertension: Secondary | ICD-10-CM

## 2014-07-07 DIAGNOSIS — E785 Hyperlipidemia, unspecified: Secondary | ICD-10-CM

## 2014-07-07 NOTE — Patient Instructions (Signed)
Your physician wants you to follow-up in: One Year with Dr.Harding You will receive a reminder letter in the mail two months in advance. If you don't receive a letter, please call our office to schedule the follow-up appointment.  DECREASE your Cozaar to 25 mg daily.

## 2014-07-09 ENCOUNTER — Encounter: Payer: Self-pay | Admitting: Cardiology

## 2014-07-09 NOTE — Assessment & Plan Note (Signed)
Michaela Raymond weight loss. Now that her concerns of malabsorption that seemed to also be stabilized. She looks healthy and happy.

## 2014-07-09 NOTE — Progress Notes (Signed)
PATIENT: Michaela Raymond MRN: [id number]  DOB: Jan 30, 1956   DOV:07/09/2014 PCP: Jenny Reichmann, MD  Clinic Note: Chief Complaint  Patient presents with  . Follow-up    overdue 1 year. no complaints,    HPI: Michaela Raymond is a 59 y.o. female with a PMH below who presents today for almost one year visit.  She nowmore than 2 years status post gastric bypass surgery has lost a significant amount of weight.  I have been following her for her aortic atherosclerosis and hypertension. I saw her in October of 2014, and she was doing quite well with no major complaints.  She had lost more weight than is currently documented, however gained to the back because she is eating a little more, and not having as much trouble with malabsorption..  Interval History: She comes in today with a big smile on her face.  She still feels [place] aunt excited about being a new grandmother.  She is barely having any issues.  2 no cardiac issues of resting or exertional chest tightness pressure or dyspnea. No PND, orthopnea or edema. No rapid irregular heartbeat/palpitations, tachycardia, syncope/near syncope,or TIA/amaurosis fugaxsymptoms. She says she probably doesn't need the use of CPAP, but he did use it just because she is used to it now.     Past Medical History  Diagnosis Date  . Dysrhythmia   . Angina November 2011    Normal coronary angiogram; noncardiac symptom  . Heart murmur   . Asthma   . Hypertension   . COPD (chronic obstructive pulmonary disease)   . Bronchitis   . Pneumonia   . Sleep apnea     cpap machine x 9 years  . Hypothyroidism   . Adenoma     of thyroid gland, benign  . Chronic kidney disease     cyst on right kidney hx of  . Barrett's esophagus   . GERD (gastroesophageal reflux disease)   . Headache(784.0)   . Fibromyalgia   . Arthritis     osteoarthrits  . Depression   . Anxiety   . Diabetes mellitus   . Hyperlipidemia   . Morbid obesity     Status post gastric bypass  surgery September 2013   Surgical History Reviewed in Epic  Extensive Allergy List Reviewed.  Current Outpatient Prescriptions  Medication Sig Dispense Refill  . acetaminophen (TYLENOL 8 HOUR) 650 MG CR tablet Take 1,300 mg by mouth 2 (two) times daily.    Marland Kitchen alosetron (LOTRONEX) 0.5 MG tablet Take 0.5 mg by mouth at bedtime.      Marland Kitchen amoxicillin (AMOXIL) 250 MG/5ML suspension Take 500 mg by mouth as needed (for dental procedures.).    Marland Kitchen Biotin 1000 MCG tablet Take 1,000 mcg by mouth 3 (three) times daily.    . butalbital-acetaminophen-caffeine (FIORICET, ESGIC) 50-325-40 MG per tablet Take 1 tablet by mouth as needed.    . calcium-vitamin D (OSCAL WITH D) 500-200 MG-UNIT per tablet Take 1 tablet by mouth daily.    Marland Kitchen CLIMARA PRO 0.045-0.015 MG/DAY Place 1 patch onto the skin once a week.  11  . desoximetasone (TOPICORT) 0.25 % cream Apply 1 application topically as needed.     Marland Kitchen estradiol (VIVELLE-DOT) 0.1 MG/24HR patch Place 1 patch onto the skin once a week.    . FeFum-FePoly-FA-B Cmp-C-Biot (FOLIVANE-PLUS) CAPS Take 1 capsule by mouth daily.    . fluticasone (FLONASE) 50 MCG/ACT nasal spray Place 2 sprays into both nostrils daily. PATIENT NEEDS OFFICE VISIT  FOR ADDITIONAL REFILLS 16 g 0  . gabapentin (NEURONTIN) 100 MG capsule Take 100 mg by mouth 2 (two) times daily. 100mg  each morning, 300mg  each evening    . levocetirizine (XYZAL) 5 MG tablet Take 1 tablet by mouth daily.  2  . levothyroxine (SYNTHROID, LEVOTHROID) 137 MCG tablet Take 137 mcg by mouth every morning.      . lidocaine (LIDODERM) 5 % Place 1-2 patches onto the skin daily. Remove & Discard patch within 12 hours. PATIENT NEEDS OFFICE VISIT FOR MORE 30 patch 0  . losartan (COZAAR) 25 MG tablet Take 50 mg by mouth daily.     . montelukast (SINGULAIR) 10 MG tablet Take 1 tablet (10 mg total) by mouth at bedtime. PATIENT NEEDS OFFICE VISIT FOR ADDITIONAL REFILLS - 2nd NOTICE 30 tablet 0  . Multiple Vitamin (MULTIVITAMIN) tablet  Take 1 tablet by mouth daily.    Marland Kitchen omeprazole (PRILOSEC) 20 MG capsule Take 20 mg by mouth 2 (two) times daily.    . pantoprazole (PROTONIX) 20 MG tablet Take 1 tablet by mouth daily.  11  . zinc gluconate 50 MG tablet Take 50 mg by mouth daily.     No current facility-administered medications for this visit.    History   Social History Narrative   She is a separated mother of 3.  2 children from her first marriage and one from the second one.  She has one grandchild.  He she quit smoking in September 2013.  She does not drink.  She exercises at least 2-3 days a week doing either swimming biking or running.   she is a new grandma, and is looking forward to being able to chas her grandkids around the  ROS: A comprehensive Review of Systems - was performed  Review of Systems  Constitutional: Negative for weight loss and malaise/fatigue.  HENT: Negative for nosebleeds.   Respiratory: Negative for cough.   Cardiovascular: Negative for palpitations, claudication and leg swelling.  Gastrointestinal:       Less notable malabsorption  Psychiatric/Behavioral: The patient is nervous/anxious.   All other systems reviewed and are negative.   Wt Readings from Last 3 Encounters:  07/07/14 136 lb 4.8 oz (61.825 kg)  03/31/13 135 lb 9.6 oz (61.508 kg)  08/26/12 148 lb (67.132 kg)    PHYSICAL EXAM BP 102/60 mmHg  Pulse 55  Ht 5' 3.5" (1.613 m)  Wt 136 lb 4.8 oz (61.825 kg)  BMI 23.76 kg/m2 General appearance: alert, cooperative, appears stated age, no distress; thin, healthy-appearing Neck: no adenopathy, no carotid bruit and no JVD Lungs: clear to auscultation bilaterally, normal percussion bilaterally and non-labored Heart: regular rate and rhythm, S1, S2 normal, soft 1/6 SEM at the RUSB.  Also soft S4., click, rub or gallop; nondisplaced Abdomen: soft, non-tender; bowel sounds normal; no masses,  no organomegaly; residual skin status post weight loss Extremities: extremities normal,  atraumatic, no cyanosis or edema Pulses: 2+ and symmetric; Neurologic: Mental status: Alert, oriented, thought content appropriate Cranial nerves: normal (II-XII grossly intact)  VEL:[id number] today: Yes Rate: 55, Rhythm: Sinus bradycardia, otherwise normal;   Recent Labs: Check by PCP  ASSESSMENT / PLAN: History of Morbid obesity - status post gastric bypass surgery Maisie weight loss. Now that her concerns of malabsorption that seemed to also be stabilized. She looks healthy and happy.   OSA on CPAP Probably does not use it but just feels comfortable. For now we'll continue   Essential hypertension Originally uncontrolled status post nephrectomy it  is improved. Now very stable.  At this point I think we can drop her Cozaar 25 mg daily   Hyperlipidemia As the followed by her PCP. Has been well controlled by report. Is not currently on a statin    No orders of the defined types were placed in this encounter.   Followup: One year  Leonie Man, M.D., M.S. Interventional Cardiologist   Pager # [phone]

## 2014-07-09 NOTE — Assessment & Plan Note (Signed)
As the followed by her PCP. Has been well controlled by report. Is not currently on a statin

## 2014-07-09 NOTE — Assessment & Plan Note (Signed)
Probably does not use it but just feels comfortable. For now we'll continue

## 2014-07-09 NOTE — Assessment & Plan Note (Signed)
Originally uncontrolled status post nephrectomy it is improved. Now very stable.  At this point I think we can drop her Cozaar 25 mg daily

## 2014-09-02 ENCOUNTER — Other Ambulatory Visit: Payer: Self-pay | Admitting: Physical Medicine and Rehabilitation

## 2014-09-02 DIAGNOSIS — M545 Low back pain: Secondary | ICD-10-CM

## 2014-09-11 ENCOUNTER — Ambulatory Visit
Admission: RE | Admit: 2014-09-11 | Discharge: 2014-09-11 | Disposition: A | Discharge status: Home / Self Care | Payer: BC Managed Care – PPO | Source: Ambulatory Visit | Attending: Physical Medicine and Rehabilitation | Admitting: Physical Medicine and Rehabilitation

## 2014-09-11 DIAGNOSIS — M545 Low back pain: Secondary | ICD-10-CM

## 2014-09-12 ENCOUNTER — Emergency Department (HOSPITAL_BASED_OUTPATIENT_CLINIC_OR_DEPARTMENT_OTHER)
Admission: EM | Admit: 2014-09-12 | Discharge: 2014-09-12 | Disposition: A | Discharge status: Home / Self Care | Payer: BLUE CROSS/BLUE SHIELD | Attending: Emergency Medicine | Admitting: Emergency Medicine

## 2014-09-12 ENCOUNTER — Encounter (HOSPITAL_BASED_OUTPATIENT_CLINIC_OR_DEPARTMENT_OTHER): Payer: Self-pay | Admitting: *Deleted

## 2014-09-12 ENCOUNTER — Emergency Department (HOSPITAL_BASED_OUTPATIENT_CLINIC_OR_DEPARTMENT_OTHER): Payer: BLUE CROSS/BLUE SHIELD

## 2014-09-12 DIAGNOSIS — S52522A Torus fracture of lower end of left radius, initial encounter for closed fracture: Secondary | ICD-10-CM | POA: Diagnosis not present

## 2014-09-12 DIAGNOSIS — R011 Cardiac murmur, unspecified: Secondary | ICD-10-CM | POA: Diagnosis not present

## 2014-09-12 DIAGNOSIS — Z87891 Personal history of nicotine dependence: Secondary | ICD-10-CM | POA: Insufficient documentation

## 2014-09-12 DIAGNOSIS — E785 Hyperlipidemia, unspecified: Secondary | ICD-10-CM | POA: Diagnosis not present

## 2014-09-12 DIAGNOSIS — W1839XA Other fall on same level, initial encounter: Secondary | ICD-10-CM | POA: Diagnosis not present

## 2014-09-12 DIAGNOSIS — F419 Anxiety disorder, unspecified: Secondary | ICD-10-CM | POA: Diagnosis not present

## 2014-09-12 DIAGNOSIS — Z8701 Personal history of pneumonia (recurrent): Secondary | ICD-10-CM | POA: Diagnosis not present

## 2014-09-12 DIAGNOSIS — Y998 Other external cause status: Secondary | ICD-10-CM | POA: Diagnosis not present

## 2014-09-12 DIAGNOSIS — Z7952 Long term (current) use of systemic steroids: Secondary | ICD-10-CM | POA: Insufficient documentation

## 2014-09-12 DIAGNOSIS — Y92007 Garden or yard of unspecified non-institutional (private) residence as the place of occurrence of the external cause: Secondary | ICD-10-CM | POA: Diagnosis not present

## 2014-09-12 DIAGNOSIS — Z79899 Other long term (current) drug therapy: Secondary | ICD-10-CM | POA: Insufficient documentation

## 2014-09-12 DIAGNOSIS — Z8669 Personal history of other diseases of the nervous system and sense organs: Secondary | ICD-10-CM | POA: Insufficient documentation

## 2014-09-12 DIAGNOSIS — Z792 Long term (current) use of antibiotics: Secondary | ICD-10-CM | POA: Insufficient documentation

## 2014-09-12 DIAGNOSIS — Y9389 Activity, other specified: Secondary | ICD-10-CM | POA: Insufficient documentation

## 2014-09-12 DIAGNOSIS — K219 Gastro-esophageal reflux disease without esophagitis: Secondary | ICD-10-CM | POA: Diagnosis not present

## 2014-09-12 DIAGNOSIS — J449 Chronic obstructive pulmonary disease, unspecified: Secondary | ICD-10-CM | POA: Diagnosis not present

## 2014-09-12 DIAGNOSIS — E119 Type 2 diabetes mellitus without complications: Secondary | ICD-10-CM | POA: Insufficient documentation

## 2014-09-12 DIAGNOSIS — M1990 Unspecified osteoarthritis, unspecified site: Secondary | ICD-10-CM | POA: Insufficient documentation

## 2014-09-12 DIAGNOSIS — N189 Chronic kidney disease, unspecified: Secondary | ICD-10-CM | POA: Diagnosis not present

## 2014-09-12 DIAGNOSIS — Z862 Personal history of diseases of the blood and blood-forming organs and certain disorders involving the immune mechanism: Secondary | ICD-10-CM | POA: Insufficient documentation

## 2014-09-12 DIAGNOSIS — S5292XA Unspecified fracture of left forearm, initial encounter for closed fracture: Secondary | ICD-10-CM

## 2014-09-12 DIAGNOSIS — S6992XA Unspecified injury of left wrist, hand and finger(s), initial encounter: Secondary | ICD-10-CM | POA: Diagnosis present

## 2014-09-12 MED ORDER — HYDROCODONE-ACETAMINOPHEN 5-325 MG PO TABS: ORAL_TABLET | ORAL | Status: DC

## 2014-09-12 NOTE — Discharge Instructions (Signed)
Rest, Ice intermittently (in the first 24-48 hours), Gentle compression with an Ace wrap, and elevate (Limb above the level of the heart)   Take up to 400mg  of ibuprofen (that is usually 2 over the counter pills)  3 times a day for 5 days. Take with food.  Take vicodin for breakthrough pain, do not drink alcohol, drive, care for children or do other critical tasks while taking vicodin.  Only use the arm sling for up to 2 days. Take the arm out and rotate the shoulder every 4 hours.   Please follow with your primary care doctor in the next 2 days for a check-up. They must obtain records for further management.   Do not hesitate to return to the Emergency Department for any new, worsening or concerning symptoms.   Forearm Fracture Your caregiver has diagnosed you as having a broken bone (fracture) of the forearm. This is the part of your arm between the elbow and your wrist. Your forearm is made up of two bones. These are the radius and ulna. A fracture is a break in one or both bones. A cast or splint is used to protect and keep your injured bone from moving. The cast or splint will be on generally for about 5 to 6 weeks, with individual variations. HOME CARE INSTRUCTIONS   Keep the injured part elevated while sitting or lying down. Keeping the injury above the level of your heart (the center of the chest). This will decrease swelling and pain.  Apply ice to the injury for 15-20 minutes, 03-04 times per day while awake, for 2 days. Put the ice in a plastic bag and place a thin towel between the bag of ice and your cast or splint.  If you have a plaster or fiberglass cast:  Do not try to scratch the skin under the cast using sharp or pointed objects.  Check the skin around the cast every day. You may put lotion on any red or sore areas.  Keep your cast dry and clean.  If you have a plaster splint:  Wear the splint as directed.  You may loosen the elastic around the splint if your  fingers become numb, tingle, or turn cold or blue.  Do not put pressure on any part of your cast or splint. It may break. Rest your cast only on a pillow the first 24 hours until it is fully hardened.  Your cast or splint can be protected during bathing with a plastic bag. Do not lower the cast or splint into water.  Only take over-the-counter or prescription medicines for pain, discomfort, or fever as directed by your caregiver. SEEK IMMEDIATE MEDICAL CARE IF:   Your cast gets damaged or breaks.  You have more severe pain or swelling than you did before the cast.  Your skin or nails below the injury turn blue or gray, or feel cold or numb.  There is a bad smell or new stains and/or pus like (purulent) drainage coming from under the cast. MAKE SURE YOU:   Understand these instructions.  Will watch your condition.  Will get help right away if you are not doing well or get worse. Document Released: 06/01/2000 Document Revised: 08/27/2011 Document Reviewed: 01/22/2008 [facility] Patient Information 2015 [place], [place]. This information is not intended to replace advice given to you by your health care provider. Make sure you discuss any questions you have with your health care provider.

## 2014-09-12 NOTE — ED Notes (Signed)
Pt reports she was working in yard yesterday- fell and landed on left wrist- hand warm- +3 radial pulse present

## 2014-09-12 NOTE — ED Provider Notes (Signed)
CSN: [id number]     Arrival date & time 09/12/14  1452 History   First MD Initiated Contact with Patient 09/12/14 1529     Chief Complaint  Patient presents with  . Wrist Injury     (Consider location/radiation/quality/duration/timing/severity/associated sxs/prior Treatment) HPI  Michaela Raymond is a 59 y.o. female with past medical history significant for osteopenia, complaining of 7 out of 10 left (nondominant radial side of wrist pain especially on the volar side associated with swelling status post fall last night. Patient was gardening, she was jumping onto a shovel fell onto her left side, she did not fall with an outstretched hand. She iced it and took some Tylenol at home with little relief. She denies numbness, weakness. She endorses a mild paresthesia to the tips of the second and third finger.  Past Medical History  Diagnosis Date  . Dysrhythmia   . Angina November 2011    Normal coronary angiogram; noncardiac symptom  . Heart murmur   . Asthma   . Hypertension   . COPD (chronic obstructive pulmonary disease)   . Bronchitis   . Pneumonia   . Sleep apnea     cpap machine x 9 years  . Hypothyroidism   . Adenoma     of thyroid gland, benign  . Chronic kidney disease     cyst on right kidney hx of  . Barrett's esophagus   . GERD (gastroesophageal reflux disease)   . Headache(784.0)   . Fibromyalgia   . Arthritis     osteoarthrits  . Depression   . Anxiety   . Diabetes mellitus   . Hyperlipidemia   . Morbid obesity     Status post gastric bypass surgery September 2013   Past Surgical History  Procedure Laterality Date  . Knee arthroplasty      bilaterally  . Back surgery      spinal fusion  . Thyroidectomy    . Rotator cuff repair      2001  . Joint replacement    . Tonsillectomy      and adnoids removed  . Dilation and curettage of uterus    . Cardiac catheterization  05/03/2010    No significant CAD in L coronary system, normal LV function  . Nasal  sinus surgery      1989 and mucous cele removed  . Esophagogastroduodenoscopy    . Colonoscopy    . Cardiovascular stress test  04/15/2007    EKG negative for ischemia, no significant ischemia demonstrated.  . Transthoracic echocardiogram  05/03/2021    EF 55-65%, normal  . Laparoscopic roux-en-y gastric bypass with upper endoscopy and removal of lap band     Family History  Problem Relation Age of Onset  . Diabetes Father   . Hypertension Father   . Heart failure Father   . COPD Father   . Heart attack Father   . Arthritis Father   . Heart murmur Mother   . Hypothyroidism Mother   . Heart disease Mother   . Mental illness Sister   . Hypertension Brother   . Heart murmur Brother   . Arrhythmia Maternal Grandmother     Atrial Fibrillation  . COPD Maternal Grandmother   . Cancer Maternal Grandfather   . Alzheimer's disease Paternal Grandmother   . COPD Paternal Grandfather   . Heart failure Paternal Grandfather   . Allergies Son   . Allergies Daughter   . Allergies Daughter    History  Substance Use  Topics  . Smoking status: Former Smoker -- 0.50 packs/day for 30 years    Start date: 02/19/2012  . Smokeless tobacco: Not on file  . Alcohol Use: Yes     Comment: occassional wine   OB History    No data available     Review of Systems  10 systems reviewed and found to be negative, except as noted in the HPI.   Allergies  Sulfa antibiotics; Asa; Coreg; Escitalopram oxalate; Fenofibrate; Ibuprofen; Talwin; Tape; Verapamil; and Zithromax  Home Medications   Prior to Admission medications   Medication Sig Start Date End Date Taking? Authorizing Provider  acetaminophen (TYLENOL 8 HOUR) 650 MG CR tablet Take 1,300 mg by mouth 2 (two) times daily.    Historical Provider, MD  alosetron (LOTRONEX) 0.5 MG tablet Take 0.5 mg by mouth at bedtime.      Historical Provider, MD  amoxicillin (AMOXIL) 250 MG/5ML suspension Take 500 mg by mouth as needed (for dental  procedures.). 08/26/12   Rise Mu, PA-C  Biotin 1000 MCG tablet Take 1,000 mcg by mouth 3 (three) times daily.    Historical Provider, MD  butalbital-acetaminophen-caffeine (FIORICET, ESGIC) 50-325-40 MG per tablet Take 1 tablet by mouth as needed. 03/24/13   Historical Provider, MD  calcium-vitamin D (OSCAL WITH D) 500-200 MG-UNIT per tablet Take 1 tablet by mouth daily.    Historical Provider, MD  CLIMARA PRO 0.045-0.015 MG/DAY Place 1 patch onto the skin once a week. 06/29/14   Historical Provider, MD  desoximetasone (TOPICORT) 0.25 % cream Apply 1 application topically as needed.     Historical Provider, MD  estradiol (VIVELLE-DOT) 0.1 MG/24HR patch Place 1 patch onto the skin once a week.    Historical Provider, MD  FeFum-FePoly-FA-B Cmp-C-Biot [facility]) CAPS Take 1 capsule by mouth daily. 03/23/13   Historical Provider, MD  fluticasone (FLONASE) 50 MCG/ACT nasal spray Place 2 sprays into both nostrils daily. PATIENT NEEDS OFFICE VISIT FOR ADDITIONAL REFILLS    Heather M Marte, PA-C  gabapentin (NEURONTIN) 100 MG capsule Take 100 mg by mouth 2 (two) times daily. 100mg  each morning, 300mg  each evening    Historical Provider, MD  levocetirizine (XYZAL) 5 MG tablet Take 1 tablet by mouth daily. 06/15/14   Historical Provider, MD  levothyroxine (SYNTHROID, LEVOTHROID) 137 MCG tablet Take 137 mcg by mouth every morning.      Historical Provider, MD  lidocaine (LIDODERM) 5 % Place 1-2 patches onto the skin daily. Remove & Discard patch within 12 hours. PATIENT NEEDS OFFICE VISIT FOR MORE 12/23/13   Darlyne Russian, MD  losartan (COZAAR) 25 MG tablet Take 50 mg by mouth daily.     Historical Provider, MD  montelukast (SINGULAIR) 10 MG tablet Take 1 tablet (10 mg total) by mouth at bedtime. PATIENT NEEDS OFFICE VISIT FOR ADDITIONAL REFILLS - 2nd NOTICE 07/14/13   Theda Sers, PA-C  Multiple Vitamin (MULTIVITAMIN) tablet Take 1 tablet by mouth daily.    Historical Provider, MD  omeprazole (PRILOSEC)  20 MG capsule Take 20 mg by mouth 2 (two) times daily.    Historical Provider, MD  pantoprazole (PROTONIX) 20 MG tablet Take 1 tablet by mouth daily. 06/22/14   Historical Provider, MD  zinc gluconate 50 MG tablet Take 50 mg by mouth daily.    Historical Provider, MD   BP 139/62 mmHg  Pulse 56  Temp(Src) 99.3 F (37.4 C) (Oral)  Resp 18  Ht 5' 3.5" (1.613 m)  Wt 136 lb (61.689 kg)  BMI 23.71 kg/m2  SpO2 100% Physical Exam  Constitutional: She is oriented to person, place, and time. She appears well-developed and well-nourished. No distress.  HENT:  Head: Normocephalic.  Eyes: Conjunctivae and EOM are normal.  Cardiovascular: Normal rate and regular rhythm.   Pulmonary/Chest: Effort normal and breath sounds normal. No stridor.  Abdominal: Soft. Bowel sounds are normal.  Musculoskeletal: Normal range of motion. She exhibits edema and tenderness.  After wrist with hematoma on the volar side. There is no snuffbox tenderness patient has excellent range of motion to all fingers. She can differentiate between pinprick and light touch on all 5 digits. Radial pulses 2+.  Neurological: She is alert and oriented to person, place, and time.  Psychiatric: She has a normal mood and affect.  Nursing note and vitals reviewed.   ED Course  Procedures (including critical care time)  SPLINT APPLICATION Date/Time: 8:34 PM Authorized by: Monico Blitz Consent: Verbal consent obtained. Risks and benefits: risks, benefits and alternatives were discussed Consent given by: patient Splint applied by:  technician Location details: Left wrist  Splint type: Radial gutter  Supplies used: Orthoglass Post-procedure: The splinted body part was neurovascularly unchanged following the procedure. Patient tolerance: Patient tolerated the procedure well with no immediate complications.    Labs Review Labs Reviewed - No data to display  Imaging Review Dg Wrist Complete Left  09/12/2014   CLINICAL DATA:   Pain following fall  EXAM: LEFT WRIST - COMPLETE 3+ VIEW  COMPARISON:  None.  FINDINGS: Frontal, oblique, lateral, and ulnar deviation scaphoid images were obtained. There is a subtle lucency in the distal radial metaphysis which is concerning for a nondisplaced fracture in this area. No other potential fracture seen. No dislocation. There is osteoarthritic change in the scaphotrapezial joint. There is also mild radiocarpal joint narrowing. There is narrowing of all MCP joints. No erosive change.  IMPRESSION: Subtle transverse lucency in the distal radial metaphysis, concerning for nondisplaced fracture in this area. No other evidence of fracture. No dislocation. Multifocal osteoarthritic change.   Electronically Signed   By: Lowella Grip III M.D.   On: 09/12/2014 15:20   Mr Lumbar Spine Wo Contrast  09/12/2014   CLINICAL DATA:  59 year old female with chronic lumbar back pain. Central pain radiating to the posterior right hip. More recent onset pain radiating cephalad to the bra line. Subsequent encounter.  EXAM: MRI LUMBAR SPINE WITHOUT CONTRAST  TECHNIQUE: Multiplanar, multisequence MR imaging of the lumbar spine was performed. No intravenous contrast was administered.  COMPARISON:  CT Abdomen and Pelvis 03/05/2010.  FINDINGS: Normal lumbar segmentation demonstrated in 2011. Chronic postoperative changes from L4-L5 and L5-S1 decompression plus fusion with posterior and interbody hardware. Mild associated susceptibility artifact. Since 2011 trace anterolisthesis at L3-L4 has developed or increased. No marrow edema or evidence of acute osseous abnormality.  Visualized lower thoracic spinal cord is normal with conus medularis at L2. No lipoma of the filum terminale identified.  Chronic but increased size of the right renal collecting system (series 7, image 16). The renal pelvis is more dilated than in 2011, but there is no right hydroureter identified.  Other visible abdominal viscera are within normal  limits. Partially visible uterus which was enlarged by multiple fibroids in 2011. Postoperative changes to the posterior paraspinal soft tissues.  T11-12: Disc desiccation and circumferential disc bulge. Moderate facet hypertrophy. No significant stenosis.  T12-L1:  Negative.  L1-L2: Minimal disc bulge. Mild to moderate facet hypertrophy. No significant stenosis.  L2-L3: Disc desiccation and  mild to moderate circumferential disc bulge. Moderate facet hypertrophy. Trace facet joint fluid. Mild spinal stenosis and left L2 foraminal stenosis.  L3-L4: Trace anterolisthesis. Disc desiccation and disc space loss. Bulky circumferential disc osteophyte complex. Broad-based posterior component of disc. Severe facet and ligament flavum hypertrophy. Severe spinal stenosis (series 7, image 15. Moderate bilateral L3 foraminal stenosis.  L4-L5:  Sequelae of decompression and fusion.  No stenosis.  L5-S1: Sequelae of decompression and fusion. Residual foraminal endplate spurring but no definite stenosis.  Incidental S3 level sacral Tarlov cyst.  IMPRESSION: 1. Enlarged right renal collecting system since 2011 compatible with hydronephrosis. No hydroureter identified, perhaps this represents acquired UPJ scarring/stenosis. Followup renal ultrasound could be compared to the prior renal ultrasound on 05/02/2009. Alternatively IV contrast-enhanced CT Abdomen and Pelvis could also prove valuable. 2. Postoperative changes from decompression and fusion at L4-L5 and L5-S1 with no adverse features. 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 3. Multifactorial mild spinal and left foraminal stenosis at L2-L3.  These results will be called to the ordering clinician or representative by the Radiologist Assistant, and communication documented in the PACS or zVision Dashboard.   Electronically Signed   By: Genevie Ann M.D.   On: 09/12/2014 10:42     EKG Interpretation None       MDM   Final diagnoses:  Radius fracture, left, closed, initial encounter    Filed Vitals:   09/12/14 1501  BP: 139/62  Pulse: 56  Temp: 99.3 F (37.4 C)  TempSrc: Oral  Resp: 18  Height: 5' 3.5" (1.613 m)  Weight: 136 lb (61.689 kg)  SpO2: 100%    SAILOR HEVIA is a pleasant 59 y.o. female presenting with left wrist pain after fall last night. Patient is neurovascularly intact. X-ray shows a subtle transverse lucency in the distal radial metaphyses. Patient will be placed in radial gutter splint, given sling and hand follow-up with Dr. Lenon Curt. Patient is driving herself home, this limits the pain medication that can be administered in the ED. All right her for Vicodin.  Evaluation does not show pathology that would require ongoing emergent intervention or inpatient treatment. Pt is hemodynamically stable and mentating appropriately. Discussed findings and plan with patient/guardian, who agrees with care plan. All questions answered. Return precautions discussed and outpatient follow up given.   New Prescriptions   HYDROCODONE-ACETAMINOPHEN (NORCO/VICODIN) 5-325 MG PER TABLET    Take 1-2 tablets by mouth every 6 hours as needed for pain and/or cough.         Monico Blitz, PA-C 09/12/14 [place], MD 09/12/14 [phone]

## 2014-09-15 ENCOUNTER — Ambulatory Visit: Payer: BLUE CROSS/BLUE SHIELD | Attending: Physical Medicine and Rehabilitation

## 2014-09-15 DIAGNOSIS — J449 Chronic obstructive pulmonary disease, unspecified: Secondary | ICD-10-CM | POA: Insufficient documentation

## 2014-09-15 DIAGNOSIS — J45909 Unspecified asthma, uncomplicated: Secondary | ICD-10-CM | POA: Insufficient documentation

## 2014-09-15 DIAGNOSIS — M546 Pain in thoracic spine: Secondary | ICD-10-CM | POA: Diagnosis not present

## 2014-09-15 DIAGNOSIS — E785 Hyperlipidemia, unspecified: Secondary | ICD-10-CM | POA: Diagnosis not present

## 2014-09-15 DIAGNOSIS — Z981 Arthrodesis status: Secondary | ICD-10-CM | POA: Diagnosis not present

## 2014-09-15 DIAGNOSIS — M8580 Other specified disorders of bone density and structure, unspecified site: Secondary | ICD-10-CM | POA: Insufficient documentation

## 2014-09-15 DIAGNOSIS — M797 Fibromyalgia: Secondary | ICD-10-CM | POA: Insufficient documentation

## 2014-09-15 DIAGNOSIS — M545 Low back pain, unspecified: Secondary | ICD-10-CM

## 2014-09-15 DIAGNOSIS — I10 Essential (primary) hypertension: Secondary | ICD-10-CM | POA: Insufficient documentation

## 2014-09-15 DIAGNOSIS — K219 Gastro-esophageal reflux disease without esophagitis: Secondary | ICD-10-CM | POA: Insufficient documentation

## 2014-09-15 DIAGNOSIS — R6889 Other general symptoms and signs: Secondary | ICD-10-CM | POA: Insufficient documentation

## 2014-09-15 NOTE — Patient Instructions (Signed)
Perform all exercises below:  Hold _20___ seconds. Repeat _3___ times.  Do __3__ sessions per day. CAUTION: Movement should be gentle, steady and slow.  Knee to Chest  Lying supine, bend involved knee to chest. Perform with each leg.  Copyright  VHI. All rights reserved.  Double Knee to Chest (Flexion)   Gently pull both knees toward chest. Feel stretch in lower back or buttock area. Breathing deeply, HIP: Hamstrings - Short Sitting   Rest leg on raised surface. Keep knee straight. Lift chest. Hold 20___ seconds. _3__ reps per set, 3___ sets per day, ___ days per week  Copyright  VHI. All rights reserved.   Posture - Standing   Good posture is important. Avoid slouching and forward head thrust. Maintain curve in low back and align ears over shoulders, hips over ankles.  Pull your belly button in toward your back bone. Posture Tips DO: - stand tall and erect - keep chin tucked in - keep head and shoulders in alignment - check posture regularly in mirror or large window - pull head back against headrest in car seat;  Change your position often.  Sit with lumbar support. DON'T: - slouch or slump while watching TV or reading - sit, stand or lie in one position  for too long;  Sitting is especially hard on the spine so if you sit at a desk/use the computer, then stand up often! Copyright  VHI. All rights reserved.  Posture - Sitting  Sit upright, head facing forward. Try using a roll to support lower back. Keep shoulders relaxed, and avoid rounded back. Keep hips level with knees. Avoid crossing legs for long periods. Copyright  VHI. All rights reserved.  Chronic neck strain can develop because of poor posture and faulty work habits  Postural strain related to slumped sitting and forward head posture is a leading cause of headaches, neck and upper back pain  General strengthening and flexibility exercises are helpful in the treatment of neck pain.  Most importantly, you should  learn to correct the posture that may be contributing to chronic pain.   Change positions frequently  Change your work or home environment to improve posture and mechanics.

## 2014-09-15 NOTE — Therapy (Signed)
[facility] [address] [place], [place], [address] Phone: [phone]   Fax:  [phone]  Physical Therapy Evaluation  Patient Details  Name: Michaela Raymond MRN: [id number] Date of Birth: 06/20/1955 Referring Provider:  Magnus Sinning, MD  Encounter Date: 09/15/2014      PT End of Session - 09/15/14 1309    Visit Number 1   Date for PT Re-Evaluation 11/10/14   PT Start Time 1232   PT Stop Time 1311   PT Time Calculation (min) 39 min   Activity Tolerance Patient tolerated treatment well   Behavior During Therapy [facility] for tasks assessed/performed      Past Medical History  Diagnosis Date  . Dysrhythmia   . Angina November 2011    Normal coronary angiogram; noncardiac symptom  . Heart murmur   . Asthma   . Hypertension   . COPD (chronic obstructive pulmonary disease)   . Bronchitis   . Pneumonia   . Sleep apnea     cpap machine x 9 years  . Hypothyroidism   . Adenoma     of thyroid gland, benign  . Chronic kidney disease     cyst on right kidney hx of  . Barrett's esophagus   . GERD (gastroesophageal reflux disease)   . Headache(784.0)   . Fibromyalgia   . Arthritis     osteoarthrits  . Depression   . Anxiety   . Diabetes mellitus   . Hyperlipidemia   . Morbid obesity     Status post gastric bypass surgery September 2013  . Osteoporosis   . Osteopenia     Past Surgical History  Procedure Laterality Date  . Knee arthroplasty      bilaterally  . Back surgery      spinal fusion  . Thyroidectomy    . Rotator cuff repair      2001  . Joint replacement    . Tonsillectomy      and adnoids removed  . Dilation and curettage of uterus    . Cardiac catheterization  05/03/2010    No significant CAD in L coronary system, normal LV function  . Nasal sinus surgery      1989 and mucous cele removed  . Esophagogastroduodenoscopy    . Colonoscopy    . Cardiovascular stress test  04/15/2007     EKG negative for ischemia, no significant ischemia demonstrated.  . Transthoracic echocardiogram  05/03/2021    EF 55-65%, normal  . Laparoscopic roux-en-y gastric bypass with upper endoscopy and removal of lap band      There were no vitals filed for this visit.  Visit Diagnosis:  Bilateral low back pain without sciatica - Plan: PT plan of care cert/re-cert  Bilateral thoracic back pain - Plan: PT plan of care cert/re-cert  Activity intolerance - Plan: PT plan of care cert/re-cert      Subjective Assessment - 09/15/14 1237    Symptoms Pt presents to PT with LBP and thoracic pain of a chronic nature.  Pt reports that mid/thoracic pain began more recently (3 weeks ago)   Pertinent History Lumbar fusion: L4-S1 in 2010   Limitations Standing;Walking   How long can you stand comfortably? 10 minutes max   How long can you walk comfortably? worse later in the day.  This varies 20-30 minutes max.   Diagnostic tests MRI: complete 4 days ago. Pt doesnt have result yet.   Patient Stated Goals Stand longer, less pain  Currently in Pain? Yes   Pain Score 8    Pain Location Thoracic   Pain Orientation Right;Left   Pain Descriptors / Indicators Burning;Constant   Pain Type Acute pain   Pain Onset 1 to 4 weeks ago   Pain Frequency Constant   Aggravating Factors  being still, driving long periods, lifting heavy objects or her 55 month old granddaughter   Pain Relieving Factors heat, massage   Multiple Pain Sites Yes   Pain Score 5   Pain Location Back   Pain Orientation Right;Left   Pain Descriptors / Indicators Aching   Pain Type Chronic pain   Pain Onset More than a month ago   Pain Frequency Constant   Aggravating Factors  standing/walking   Pain Relieving Factors Lidocane patch, heat, TENs unit (not able to put on mid back independently)            [facility] PT Assessment - 09/15/14 0001    Assessment   Medical Diagnosis LBP (M54.5) thoracic pain (M54.6)   Onset Date 08/25/14    Next MD Visit next week   Precautions   Precautions Other (comment)  osteopenia   Precaution Comments Lt wrist fracture: 4 days ago   Restrictions   Weight Bearing Restrictions No   Balance Screen   Has the patient fallen in the past 6 months No   Has the patient had a decrease in activity level because of a fear of falling?  No   Is the patient reluctant to leave their home because of a fear of falling?  No   Home Environment   Living Enviornment Private residence   Prior Function   Level of Independence Independent with basic ADLs   Vocation Other (comment)  No currently working.  She cares for her 68 year daughter   Cognition   Overall Cognitive Status Within Functional Limits for tasks assessed   Observation/Other Assessments   Focus on Therapeutic Outcomes (FOTO)  45% limitation   Posture/Postural Control   Posture/Postural Control Postural limitations   Postural Limitations Forward head;Rounded Shoulders   ROM / Strength   AROM / PROM / Strength AROM;PROM;Strength   AROM   Overall AROM  Within functional limits for tasks performed   Overall AROM Comments Lumbar AROM is WFLs with stiffness and pulling reported at end range of each motion   AROM Assessment Site Lumbar;Thoracic   PROM   Overall PROM  Within functional limits for tasks performed   Overall PROM Comments Full LE PROM in all directions   PROM Assessment Site Hip   Strength   Overall Strength Within functional limits for tasks performed   Overall Strength Comments 4+/5 to 5/5 bilateral LE strength.  UE strength not tested secondary to old shoulder surgery and limited strenth/AROM   Palpation   Palpation Pt with muscle tension T1-S1 bilaterally.  Reduced PA mobility along the spine without pain                           PT Education - 09/15/14 1304    Education provided Yes   Education Details single knee to chest, double knee to chest, seated hamstring stretch, posture education    Person(s) Educated Patient   Methods Explanation;Handout   Comprehension Verbalized understanding;Returned demonstration          PT Short Term Goals - 09/15/14 1314    PT SHORT TERM GOAL #1   Title be independent in initial HEP  Time 4   Period Weeks   Status New   PT SHORT TERM GOAL #2   Title report a 25% reduction in thoracic and lumbar pain with driving   Time 4   Period Weeks   Status New   PT SHORT TERM GOAL #3   Title stand for 20 minutes without need to rest   Time 4   Period Weeks   Status New           PT Long Term Goals - 09/15/14 1229    PT LONG TERM GOAL #1   Title be independent in final HEP   Time 8   Period Weeks   Status New   PT LONG TERM GOAL #2   Title reduce FOTO to < or = to 40% limitation   Time 8   Period Weeks   Status New   PT LONG TERM GOAL #3   Title report a 50% reduction in lumbar and thoracic pain with driving   Time 8   Period Weeks   Status New   PT LONG TERM GOAL #4   Title report < or = to 4/10 thoracic pain and lumbar pain after standing for housework   Time 8   Period Weeks   Status New               Plan - 09/15/14 1311    Clinical Impression Statement Pt with chronic LBP s/p fusion and new onset of thoracic pain due to poor posture.  Pt with weak core.   Pt will benefit from skilled therapeutic intervention in order to improve on the following deficits Improper body mechanics;Decreased endurance;Postural dysfunction;Pain;Decreased activity tolerance   PT Frequency 2x / week   PT Duration 8 weeks   PT Treatment/Interventions ADLs/Self Care Home Management;Moist Heat;Therapeutic activities;Therapeutic exercise;Ultrasound;Manual techniques;Neuromuscular re-education;Electrical Stimulation   PT Next Visit Plan Body mechanics, flexibility, thoracic strength, manual, [place], e-stim and heat   Consulted and Agree with Plan of Care Patient         Problem List Patient Active Problem List   Diagnosis Date Noted   . Essential hypertension 04/02/2013  . Hyperlipidemia   . History of Morbid obesity - status post gastric bypass surgery   . Other abnormal glucose 10/25/2011  . Other malaise and fatigue 10/25/2011  . Myalgia and myositis, unspecified 10/25/2011  . Degeneration of lumbar or lumbosacral intervertebral disc 10/25/2011  . Degeneration of cervical intervertebral disc 10/25/2011  . Uncontrolled hypertension as indication for native nephrectomy 10/25/2011  . OSA on CPAP 10/25/2011  . GERD (gastroesophageal reflux disease) 10/25/2011  . IBS (irritable bowel syndrome) 10/25/2011  . COPD (chronic obstructive pulmonary disease) 10/25/2011  . Other affections of shoulder region, not elsewhere classified 06/26/2011  . Post herpetic neuralgia 09/16/2003    Tyrice Hewitt, PT 09/15/2014, 1:19 PM  [facility] [address], [place], [place], [address] Phone: [phone]   Fax:  [phone]

## 2014-09-16 ENCOUNTER — Encounter: Payer: Self-pay | Admitting: Physical Therapy

## 2014-09-16 ENCOUNTER — Ambulatory Visit: Payer: BLUE CROSS/BLUE SHIELD | Admitting: Physical Therapy

## 2014-09-16 DIAGNOSIS — M545 Low back pain, unspecified: Secondary | ICD-10-CM

## 2014-09-16 DIAGNOSIS — M546 Pain in thoracic spine: Secondary | ICD-10-CM

## 2014-09-16 DIAGNOSIS — R6889 Other general symptoms and signs: Secondary | ICD-10-CM

## 2014-09-16 NOTE — Therapy (Signed)
[facility] [address] [place], [place], [address] Phone: [phone]   Fax:  [phone]  Physical Therapy Treatment  Patient Details  Name: Michaela Raymond MRN: [id number] Date of Birth: [birth year] Referring Provider:  Nena Polio, NP  Encounter Date: 09/16/2014      PT End of Session - 09/16/14 1600    Visit Number 2   Date for PT Re-Evaluation 11/10/14   PT Start Time 1532   PT Stop Time 1632   PT Time Calculation (min) 60 min   Activity Tolerance Patient tolerated treatment well   Behavior During Therapy [facility] for tasks assessed/performed      Past Medical History  Diagnosis Date  . Dysrhythmia   . Angina November 2011    Normal coronary angiogram; noncardiac symptom  . Heart murmur   . Asthma   . Hypertension   . COPD (chronic obstructive pulmonary disease)   . Bronchitis   . Pneumonia   . Sleep apnea     cpap machine x 9 years  . Hypothyroidism   . Adenoma     of thyroid gland, benign  . Chronic kidney disease     cyst on right kidney hx of  . Barrett's esophagus   . GERD (gastroesophageal reflux disease)   . Headache(784.0)   . Fibromyalgia   . Arthritis     osteoarthrits  . Depression   . Anxiety   . Diabetes mellitus   . Hyperlipidemia   . Morbid obesity     Status post gastric bypass surgery September 2013  . Osteoporosis   . Osteopenia     Past Surgical History  Procedure Laterality Date  . Knee arthroplasty      bilaterally  . Back surgery      spinal fusion  . Thyroidectomy    . Rotator cuff repair      2001  . Joint replacement    . Tonsillectomy      and adnoids removed  . Dilation and curettage of uterus    . Cardiac catheterization  05/03/2010    No significant CAD in L coronary system, normal LV function  . Nasal sinus surgery      1989 and mucous cele removed  . Esophagogastroduodenoscopy    . Colonoscopy    . Cardiovascular stress test  04/15/2007   EKG negative for ischemia, no significant ischemia demonstrated.  . Transthoracic echocardiogram  05/03/2021    EF 55-65%, normal  . Laparoscopic roux-en-y gastric bypass with upper endoscopy and removal of lap band      There were no vitals filed for this visit.  Visit Diagnosis:  Bilateral low back pain without sciatica  Bilateral thoracic back pain  Activity intolerance      Subjective Assessment - 09/15/14 1237    Symptoms Pt presents to PT with LBP and thoracic pain of a chronic nature.  Pt reports that mid/thoracic pain began more recently (3 weeks ago)   Pertinent History Lumbar fusion: L4-S1 in 2010   Limitations Standing;Walking   How long can you stand comfortably? 10 minutes max   How long can you walk comfortably? worse later in the day.  This varies 20-30 minutes max.   Diagnostic tests MRI: complete 4 days ago. Pt doesnt have result yet.   Patient Stated Goals Stand longer, less pain   Currently in Pain? Yes   Pain Score 8    Pain Location Thoracic   Pain Orientation Right;Left  Pain Descriptors / Indicators Burning;Constant   Pain Type Acute pain   Pain Onset 1 to 4 weeks ago   Pain Frequency Constant   Aggravating Factors  being still, driving long periods, lifting heavy objects or her 56 month old granddaughter   Pain Relieving Factors heat, massage   Multiple Pain Sites Yes   Pain Score 5   Pain Location Back   Pain Orientation Right;Left   Pain Descriptors / Indicators Aching   Pain Type Chronic pain   Pain Onset More than a month ago   Pain Frequency Constant   Aggravating Factors  standing/walking   Pain Relieving Factors Lidocane patch, heat, TENs unit (not able to put on mid back independently)                       [facility] Adult PT Treatment/Exercise - 09/16/14 0001    Exercises   Exercises Lumbar   Lumbar Exercises: Stretches   Active Hamstring Stretch 3 reps;20 seconds  each leg in sitting   Lumbar Exercises: Aerobic    Stationary Bike L2 x 51min   Lumbar Exercises: Seated   Other Seated Lumbar Exercises trunk stability with 3# ibil biceps, pt tolerated well   Lumbar Exercises: Supine   Other Supine Lumbar Exercises Decompression series x 5 with 5 sec hold, Lt Ue elevated on pillow   Modalities   Modalities Electrical Stimulation;Moist Heat   Moist Heat Therapy   Number Minutes Moist Heat 15 Minutes   Moist Heat Location Other (comment)  Back   Electrical Stimulation   Electrical Stimulation Location lumbar   Electrical Stimulation Action IFC   Electrical Stimulation Parameters 80-150Hz    Electrical Stimulation Goals Pain                PT Education - 09/16/14 1559    Education provided Yes   Education Details decompression exercises "Meeks"   Person(s) Educated Patient   Methods Explanation;Handout   Comprehension Verbalized understanding;Returned demonstration          PT Short Term Goals - 09/15/14 1314    PT SHORT TERM GOAL #1   Title be independent in initial HEP   Time 4   Period Weeks   Status New   PT SHORT TERM GOAL #2   Title report a 25% reduction in thoracic and lumbar pain with driving   Time 4   Period Weeks   Status New   PT SHORT TERM GOAL #3   Title stand for 20 minutes without need to rest   Time 4   Period Weeks   Status New           PT Long Term Goals - 09/15/14 1229    PT LONG TERM GOAL #1   Title be independent in final HEP   Time 8   Period Weeks   Status New   PT LONG TERM GOAL #2   Title reduce FOTO to < or = to 40% limitation   Time 8   Period Weeks   Status New   PT LONG TERM GOAL #3   Title report a 50% reduction in lumbar and thoracic pain with driving   Time 8   Period Weeks   Status New   PT LONG TERM GOAL #4   Title report < or = to 4/10 thoracic pain and lumbar pain after standing for housework   Time 8   Period Weeks   Status New  Plan - 09/16/14 1602    Clinical Impression Statement Pt with low  endurance she was fatique after riding the bike for 18min   Pt will benefit from skilled therapeutic intervention in order to improve on the following deficits Improper body mechanics;Decreased endurance;Postural dysfunction;Pain;Decreased activity tolerance   Rehab Potential Good   PT Frequency 2x / week   PT Duration 8 weeks   PT Treatment/Interventions ADLs/Self Care Home Management;Moist Heat;Therapeutic activities;Therapeutic exercise;Ultrasound;Manual techniques;Neuromuscular re-education;Electrical Stimulation   PT Next Visit Plan continue with awarness for proper bodymechanics, review meeks, thoracic strength, continue with modalities if beneficial today   Consulted and Agree with Plan of Care Patient        Problem List Patient Active Problem List   Diagnosis Date Noted  . Essential hypertension 04/02/2013  . Hyperlipidemia   . History of Morbid obesity - status post gastric bypass surgery   . Other abnormal glucose 10/25/2011  . Other malaise and fatigue 10/25/2011  . Myalgia and myositis, unspecified 10/25/2011  . Degeneration of lumbar or lumbosacral intervertebral disc 10/25/2011  . Degeneration of cervical intervertebral disc 10/25/2011  . Uncontrolled hypertension as indication for native nephrectomy 10/25/2011  . OSA on CPAP 10/25/2011  . GERD (gastroesophageal reflux disease) 10/25/2011  . IBS (irritable bowel syndrome) 10/25/2011  . COPD (chronic obstructive pulmonary disease) 10/25/2011  . Other affections of shoulder region, not elsewhere classified 06/26/2011  . Post herpetic neuralgia 09/16/2003    NAUMANN-HOUEGNIFIO,Allie Ousley PTA 09/16/2014, 5:20 PM  [facility] [address] [place], [place], [address] Phone: [phone]   Fax:  [phone]

## 2014-09-16 NOTE — Patient Instructions (Signed)
RE-ALIGNMENT ROUTINE EXERCISES-OSTEOPROROSIS BASIC FOR POSTURAL CORRECTION   RE-ALIGNMENT Tips BENEFITS: 1.It helps to re-align the curves of the back and improve standing posture. 2.It allows the back muscles to rest and strengthen in preparation for more activity. FREQUENCY: Daily, even after weeks, months and years of more advanced exercises. START: 1.All exercises start in the same position: lying on the back, arms resting on the supporting surface, palms up and slightly away from the body, backs of hands down, knees bent, feet flat. 2.The head, neck, arms, and legs are supported according to specific instructions of your therapist. Copyright  VHI. All rights reserved.    1. Decompression Exercise: Basic.   Takes compression off the vertebral bodies; increases tolerance for lying on the back; helps relieve back pain   Lie on back on firm surface, knees bent, feet flat, arms turned up, out to sides (~35 degrees). Head neck and rams supported as necessary. Time _5-15__ minutes. Surface: floor     2. Shoulder Press  Strengthens upper back extensors and scapular retractors.   Press both shoulders down. Hold _2-3__ seconds. Repeat _3-5__ times. Surface: floor        3. Head Press With [place]  Strengthens neck extensors   Tuck chin SLIGHTLY toward chest, keep mouth closed. Feel weight on back of head. Increase weight by pressing head down. Hold _2-3__ seconds. Relax. Repeat 3-5___ times. Surface: floor   4. Leg Lengthener: stretches quadratus lumborum and hip flexors.  Strengthens quads and ankle dorsiflexors.   Straighten one leg. Pull toes AND forefoot toward knee, extend heel. Lengthen leg by pulling pelvis away from ribs. Hold _2-3__ seconds. Relax. Repeat 1 time. Re-bend knee. Do other leg. Each leg 4-6___ times. Surface: floor  Copyright  VHI. All rights reserved.   5. Leg Press.   Strengthens gluteus maximus, lower erector spinae and ankle dorsiflexors.     Repeat the above motion (Leg lengthener) and press entire leg downward.  Imagine making an impression of the leg in the sand.  Hold 2-3 seconds and relax.  Repeat on the other leg.  Each leg 4-6 times.

## 2014-09-20 ENCOUNTER — Encounter: Payer: Self-pay | Admitting: Physical Therapy

## 2014-09-20 ENCOUNTER — Ambulatory Visit: Payer: BLUE CROSS/BLUE SHIELD | Attending: Physical Medicine and Rehabilitation | Admitting: Physical Therapy

## 2014-09-20 DIAGNOSIS — I10 Essential (primary) hypertension: Secondary | ICD-10-CM | POA: Diagnosis not present

## 2014-09-20 DIAGNOSIS — M545 Low back pain, unspecified: Secondary | ICD-10-CM

## 2014-09-20 DIAGNOSIS — J449 Chronic obstructive pulmonary disease, unspecified: Secondary | ICD-10-CM | POA: Diagnosis not present

## 2014-09-20 DIAGNOSIS — J45909 Unspecified asthma, uncomplicated: Secondary | ICD-10-CM | POA: Diagnosis not present

## 2014-09-20 DIAGNOSIS — E785 Hyperlipidemia, unspecified: Secondary | ICD-10-CM | POA: Insufficient documentation

## 2014-09-20 DIAGNOSIS — M546 Pain in thoracic spine: Secondary | ICD-10-CM | POA: Insufficient documentation

## 2014-09-20 DIAGNOSIS — K219 Gastro-esophageal reflux disease without esophagitis: Secondary | ICD-10-CM | POA: Insufficient documentation

## 2014-09-20 DIAGNOSIS — Z981 Arthrodesis status: Secondary | ICD-10-CM | POA: Insufficient documentation

## 2014-09-20 DIAGNOSIS — M797 Fibromyalgia: Secondary | ICD-10-CM | POA: Insufficient documentation

## 2014-09-20 DIAGNOSIS — M8580 Other specified disorders of bone density and structure, unspecified site: Secondary | ICD-10-CM | POA: Diagnosis not present

## 2014-09-20 DIAGNOSIS — R6889 Other general symptoms and signs: Secondary | ICD-10-CM | POA: Diagnosis not present

## 2014-09-20 NOTE — Therapy (Signed)
[facility] [address] [place], [place], [address] Phone: [phone]   Fax:  [phone]  Physical Therapy Treatment  Michaela Raymond Details  Name: Michaela Raymond MRN: [id number] Date of Birth: 10/03/1955 Referring Provider:  Magnus Sinning, MD  Encounter Date: 09/20/2014      PT End of Session - 09/20/14 1312    Visit Number 3   Date for PT Re-Evaluation 11/10/14   PT Start Time 1238   PT Stop Time 1327   PT Time Calculation (min) 49 min   Activity Tolerance Michaela Raymond limited by lethargy   Behavior During Therapy --  Tired and emotional      Past Medical History  Diagnosis Date  . Dysrhythmia   . Angina November 2011    Normal coronary angiogram; noncardiac symptom  . Heart murmur   . Asthma   . Hypertension   . COPD (chronic obstructive pulmonary disease)   . Bronchitis   . Pneumonia   . Sleep apnea     cpap machine x 9 years  . Hypothyroidism   . Adenoma     of thyroid gland, benign  . Chronic kidney disease     cyst on right kidney hx of  . Barrett's esophagus   . GERD (gastroesophageal reflux disease)   . Headache(784.0)   . Fibromyalgia   . Arthritis     osteoarthrits  . Depression   . Anxiety   . Diabetes mellitus   . Hyperlipidemia   . Morbid obesity     Status post gastric bypass surgery September 2013  . Osteoporosis   . Osteopenia     Past Surgical History  Procedure Laterality Date  . Knee arthroplasty      bilaterally  . Back surgery      spinal fusion  . Thyroidectomy    . Rotator cuff repair      2001  . Joint replacement    . Tonsillectomy      and adnoids removed  . Dilation and curettage of uterus    . Cardiac catheterization  05/03/2010    No significant CAD in L coronary system, normal LV function  . Nasal sinus surgery      1989 and mucous cele removed  . Esophagogastroduodenoscopy    . Colonoscopy    . Cardiovascular stress test  04/15/2007    EKG negative  for ischemia, no significant ischemia demonstrated.  . Transthoracic echocardiogram  05/03/2021    EF 55-65%, normal  . Laparoscopic roux-en-y gastric bypass with upper endoscopy and removal of lap band      There were no vitals filed for this visit.  Visit Diagnosis:  Bilateral low back pain without sciatica  Bilateral thoracic back pain      Subjective Assessment - 09/20/14 1242    Subjective Just got back from drive to [place], very stiff in back, sore: Also very stressed right now due to some life circumstances at the moment.    Pain Score 6    Pain Location Back   Pain Orientation Left;Mid;Lower   Pain Descriptors / Indicators Aching;Discomfort   Aggravating Factors  Driving long distance   Pain Relieving Factors Heat, massage   Multiple Pain Sites No                       [facility] Adult PT Treatment/Exercise - 09/20/14 0001    Lumbar Exercises: Aerobic   Stationary Bike Lifecycle x 10 min  Moist Heat Therapy   Number Minutes Moist Heat 20 Minutes   Moist Heat Location Other (comment)  Back   Electrical Stimulation   Electrical Stimulation Location lumbar  and thoracic   Electrical Stimulation Action IFC   Electrical Stimulation Parameters 80-150 HZ   Electrical Stimulation Goals Pain   Manual Therapy   Myofascial Release including soft tissue to left thoracic to lumbar                   PT Short Term Goals - 09/20/14 1315    PT SHORT TERM GOAL #1   Title be independent in initial HEP   Time 4   Period Weeks   Status On-going  Been out of town   PT [place] #2   Title report a 25% reduction in thoracic and lumbar pain with driving   Time 4   Period Weeks   Status On-going   PT SHORT TERM GOAL #3   Title stand for 20 minutes without need to rest   Time 4   Period Weeks   Status On-going           PT Long Term Goals - 09/15/14 1229    PT LONG TERM GOAL #1   Title be independent in final HEP   Time 8   Period Weeks    Status New   PT LONG TERM GOAL #2   Title reduce FOTO to < or = to 40% limitation   Time 8   Period Weeks   Status New   PT LONG TERM GOAL #3   Title report a 50% reduction in lumbar and thoracic pain with driving   Time 8   Period Weeks   Status New   PT LONG TERM GOAL #4   Title report < or = to 4/10 thoracic pain and lumbar pain after standing for housework   Time 8   Period Weeks   Status New               Plan - 09/20/14 1313    Clinical Impression Statement Michaela Raymond very tired from returning from long car trip to [place] and received very stressful news upon her return.   Pt will benefit from skilled therapeutic intervention in order to improve on the following deficits Improper body mechanics;Decreased endurance;Postural dysfunction;Pain;Decreased activity tolerance   Rehab Potential Good   PT Frequency 2x / week   PT Duration 8 weeks   PT Treatment/Interventions ADLs/Self Care Home Management;Moist Heat;Therapeutic activities;Therapeutic exercise;Ultrasound;Manual techniques;Neuromuscular re-education;Electrical Stimulation   PT Next Visit Plan continue with awarness for proper bodymechanics, review meeks, thoracic strength, continue with modalities if beneficial today   Consulted and Agree with Plan of Care Michaela Raymond        Problem List Michaela Raymond Active Problem List   Diagnosis Date Noted  . Essential hypertension 04/02/2013  . Hyperlipidemia   . History of Morbid obesity - status post gastric bypass surgery   . Other abnormal glucose 10/25/2011  . Other malaise and fatigue 10/25/2011  . Myalgia and myositis, unspecified 10/25/2011  . Degeneration of lumbar or lumbosacral intervertebral disc 10/25/2011  . Degeneration of cervical intervertebral disc 10/25/2011  . Uncontrolled hypertension as indication for native nephrectomy 10/25/2011  . OSA on CPAP 10/25/2011  . GERD (gastroesophageal reflux disease) 10/25/2011  . IBS (irritable bowel syndrome) 10/25/2011  .  COPD (chronic obstructive pulmonary disease) 10/25/2011  . Other affections of shoulder region, not elsewhere classified 06/26/2011  . Post herpetic neuralgia  09/16/2003    COCHRAN,JENNIFER, PTA 09/20/2014, 1:17 PM  [facility] [address], [place], [place], [address] Phone: [phone]   Fax:  [phone]

## 2014-09-21 ENCOUNTER — Ambulatory Visit: Payer: BLUE CROSS/BLUE SHIELD

## 2014-09-21 ENCOUNTER — Ambulatory Visit: Payer: BC Managed Care – PPO

## 2014-09-23 ENCOUNTER — Ambulatory Visit: Payer: BLUE CROSS/BLUE SHIELD

## 2014-09-23 DIAGNOSIS — M546 Pain in thoracic spine: Secondary | ICD-10-CM

## 2014-09-23 DIAGNOSIS — R6889 Other general symptoms and signs: Secondary | ICD-10-CM

## 2014-09-23 DIAGNOSIS — M545 Low back pain, unspecified: Secondary | ICD-10-CM

## 2014-09-23 NOTE — Therapy (Signed)
[facility] [address] [place], [place], [address] Phone: [phone]   Fax:  [phone]  Physical Therapy Treatment  Patient Details  Name: Michaela Raymond MRN: [id number] Date of Birth: 20-Jan-1956 Referring Provider:  Magnus Sinning, MD  Encounter Date: 09/23/2014      PT End of Session - 09/23/14 1152    Visit Number 4   Date for PT Re-Evaluation 11/10/14   PT Start Time 1104   PT Stop Time 1202   PT Time Calculation (min) 58 min   Activity Tolerance Patient tolerated treatment well   Behavior During Therapy [facility] for tasks assessed/performed      Past Medical History  Diagnosis Date  . Dysrhythmia   . Angina November 2011    Normal coronary angiogram; noncardiac symptom  . Heart murmur   . Asthma   . Hypertension   . COPD (chronic obstructive pulmonary disease)   . Bronchitis   . Pneumonia   . Sleep apnea     cpap machine x 9 years  . Hypothyroidism   . Adenoma     of thyroid gland, benign  . Chronic kidney disease     cyst on right kidney hx of  . Barrett's esophagus   . GERD (gastroesophageal reflux disease)   . Headache(784.0)   . Fibromyalgia   . Arthritis     osteoarthrits  . Depression   . Anxiety   . Diabetes mellitus   . Hyperlipidemia   . Morbid obesity     Status post gastric bypass surgery September 2013  . Osteoporosis   . Osteopenia     Past Surgical History  Procedure Laterality Date  . Knee arthroplasty      bilaterally  . Back surgery      spinal fusion  . Thyroidectomy    . Rotator cuff repair      2001  . Joint replacement    . Tonsillectomy      and adnoids removed  . Dilation and curettage of uterus    . Cardiac catheterization  05/03/2010    No significant CAD in L coronary system, normal LV function  . Nasal sinus surgery      1989 and mucous cele removed  . Esophagogastroduodenoscopy    . Colonoscopy    . Cardiovascular stress test  04/15/2007   EKG negative for ischemia, no significant ischemia demonstrated.  . Transthoracic echocardiogram  05/03/2021    EF 55-65%, normal  . Laparoscopic roux-en-y gastric bypass with upper endoscopy and removal of lap band      There were no vitals filed for this visit.  Visit Diagnosis:  Bilateral low back pain without sciatica  Bilateral thoracic back pain  Activity intolerance      Subjective Assessment - 09/23/14 1117    Subjective Pt has been walking a lot.  Pt took a 40 minute walk in the morning and 20 minutes yesterday.  Mild LBP as a result.   Currently in Pain? Yes   Pain Score 4    Pain Location Back   Pain Orientation Right;Left;Lower   Pain Descriptors / Indicators Aching;Discomfort   Pain Type Acute pain   Pain Onset 1 to 4 weeks ago                       [facility] Adult PT Treatment/Exercise - 09/23/14 0001    Lumbar Exercises: Stretches   Single Knee to Chest Stretch 3 reps;20 seconds  Lumbar Exercises: Aerobic   Stationary Bike Level 2 x 8 min   UBE (Upper Arm Bike) seated with lumbar support: Level 10 x 4 minutes reverse   Lumbar Exercises: Supine   Other Supine Lumbar Exercises deompression exercise review: entire series of Meek's    good return demo of each   Other Supine Lumbar Exercises on foam roll: horzontal abduction with yellow band 2x10   Modalities   Modalities Electrical Stimulation;Moist Heat   Moist Heat Therapy   Number Minutes Moist Heat 15 Minutes   Moist Heat Location Other (comment)  back   Electrical Stimulation   Electrical Stimulation Location lumbar  and thoracic   Electrical Stimulation Action IFC   Electrical Stimulation Parameters 15 minutes   Electrical Stimulation Goals Pain                  PT Short Term Goals - 09/20/14 1315    PT SHORT TERM GOAL #1   Title be independent in initial HEP   Time 4   Period Weeks   Status On-going  Been out of town   PT [place] #2   Title report a 25%  reduction in thoracic and lumbar pain with driving   Time 4   Period Weeks   Status On-going   PT SHORT TERM GOAL #3   Title stand for 20 minutes without need to rest   Time 4   Period Weeks   Status On-going           PT Long Term Goals - 09/15/14 1229    PT LONG TERM GOAL #1   Title be independent in final HEP   Time 8   Period Weeks   Status New   PT LONG TERM GOAL #2   Title reduce FOTO to < or = to 40% limitation   Time 8   Period Weeks   Status New   PT LONG TERM GOAL #3   Title report a 50% reduction in lumbar and thoracic pain with driving   Time 8   Period Weeks   Status New   PT LONG TERM GOAL #4   Title report < or = to 4/10 thoracic pain and lumbar pain after standing for housework   Time 8   Period Weeks   Status New               Plan - 09/23/14 1134    Clinical Impression Statement Pt able to tolerate more activity today.  Good return demo of HEP.  Pt able to walk at home without limitation.   Pt will benefit from skilled therapeutic intervention in order to improve on the following deficits Improper body mechanics;Decreased endurance;Postural dysfunction;Pain;Decreased activity tolerance   Rehab Potential Good   PT Frequency 2x / week   PT Duration 8 weeks   PT Treatment/Interventions ADLs/Self Care Home Management;Moist Heat;Therapeutic activities;Therapeutic exercise;Ultrasound;Manual techniques;Neuromuscular re-education;Electrical Stimulation   PT Next Visit Plan Strength, endurance, flexibility.  Modalities as needed.     Consulted and Agree with Plan of Care Patient        Problem List Patient Active Problem List   Diagnosis Date Noted  . Essential hypertension 04/02/2013  . Hyperlipidemia   . History of Morbid obesity - status post gastric bypass surgery   . Other abnormal glucose 10/25/2011  . Other malaise and fatigue 10/25/2011  . Myalgia and myositis, unspecified 10/25/2011  . Degeneration of lumbar or lumbosacral  intervertebral disc 10/25/2011  . Degeneration  of cervical intervertebral disc 10/25/2011  . Uncontrolled hypertension as indication for native nephrectomy 10/25/2011  . OSA on CPAP 10/25/2011  . GERD (gastroesophageal reflux disease) 10/25/2011  . IBS (irritable bowel syndrome) 10/25/2011  . COPD (chronic obstructive pulmonary disease) 10/25/2011  . Other affections of shoulder region, not elsewhere classified 06/26/2011  . Post herpetic neuralgia 09/16/2003    Tanieka Pownall, PT 09/23/2014, 11:53 AM  [facility] [address], [place], [place], [address] Phone: [phone]   Fax:  [phone]

## 2014-09-28 ENCOUNTER — Ambulatory Visit: Payer: BLUE CROSS/BLUE SHIELD | Admitting: Physical Therapy

## 2014-09-28 ENCOUNTER — Encounter: Payer: Self-pay | Admitting: Physical Therapy

## 2014-09-28 DIAGNOSIS — M545 Low back pain, unspecified: Secondary | ICD-10-CM

## 2014-09-28 DIAGNOSIS — M546 Pain in thoracic spine: Secondary | ICD-10-CM

## 2014-09-28 DIAGNOSIS — R6889 Other general symptoms and signs: Secondary | ICD-10-CM

## 2014-09-28 NOTE — Therapy (Signed)
[facility] [address], [place], [place], [address] Phone: [phone]   Fax:  [phone]  Physical Therapy Treatment  Patient Details  Name: Michaela Raymond MRN: [id number] Date of Birth: Sep 18, 1955 Referring Provider:  Magnus Sinning, MD  Encounter Date: 09/28/2014      PT End of Session - 09/28/14 1214    Visit Number 5   Date for PT Re-Evaluation 11/10/14   PT Start Time 1130   PT Stop Time 1246   PT Time Calculation (min) 76 min   Activity Tolerance Patient tolerated treatment well   Behavior During Therapy [facility] for tasks assessed/performed      Past Medical History  Diagnosis Date  . Dysrhythmia   . Angina November 2011    Normal coronary angiogram; noncardiac symptom  . Heart murmur   . Asthma   . Hypertension   . COPD (chronic obstructive pulmonary disease)   . Bronchitis   . Pneumonia   . Sleep apnea     cpap machine x 9 years  . Hypothyroidism   . Adenoma     of thyroid gland, benign  . Chronic kidney disease     cyst on right kidney hx of  . Barrett's esophagus   . GERD (gastroesophageal reflux disease)   . Headache(784.0)   . Fibromyalgia   . Arthritis     osteoarthrits  . Depression   . Anxiety   . Diabetes mellitus   . Hyperlipidemia   . Morbid obesity     Status post gastric bypass surgery September 2013  . Osteoporosis   . Osteopenia     Past Surgical History  Procedure Laterality Date  . Knee arthroplasty      bilaterally  . Back surgery      spinal fusion  . Thyroidectomy    . Rotator cuff repair      2001  . Joint replacement    . Tonsillectomy      and adnoids removed  . Dilation and curettage of uterus    . Cardiac catheterization  05/03/2010    No significant CAD in L coronary system, normal LV function  . Nasal sinus surgery      1989 and mucous cele removed  . Esophagogastroduodenoscopy    . Colonoscopy    . Cardiovascular stress test  04/15/2007     EKG negative for ischemia, no significant ischemia demonstrated.  . Transthoracic echocardiogram  05/03/2021    EF 55-65%, normal  . Laparoscopic roux-en-y gastric bypass with upper endoscopy and removal of lap band      There were no vitals filed for this visit.  Visit Diagnosis:  Bilateral low back pain without sciatica  Bilateral thoracic back pain  Activity intolerance      Subjective Assessment - 09/28/14 1150    Subjective Pt reports walks every day 18minutes with the dogs.    Pertinent History Lumbar fusion: L4-S1 in 2010   Limitations Standing;Walking   How long can you stand comfortably? 10   How long can you walk comfortably? 30 minutes max.   Diagnostic tests  MRI shows more narrowing compare to years ago, and enlargement right renal - has appointment to address this    Patient Stated Goals Stand longer, less pain   Currently in Pain? Yes   Pain Score 4    Pain Location Back   Pain Orientation Right;Left;Lower   Pain Descriptors / Indicators Aching;Discomfort   Pain Type Acute pain  Pain Onset More than a month ago   Pain Frequency Constant   Pain Relieving Factors heat, massage   Multiple Pain Sites No                       [facility] Adult PT Treatment/Exercise - 09/28/14 0001    Lumbar Exercises: Stretches   Active Hamstring Stretch 3 reps;20 seconds   Single Knee to Chest Stretch 3 reps;20 seconds   Lumbar Exercises: Aerobic   Stationary Bike Level 5 x 40min  pt arrived early to extend the time on the bike   Lumbar Exercises: Supine   Other Supine Lumbar Exercises deompression exercise review: entire series of Meek's    good return demo of each   Other Supine Lumbar Exercises on foam roll: horzontal abduction with yellow band 2x10   Modalities   Modalities Electrical Stimulation;Moist Heat   Moist Heat Therapy   Number Minutes Moist Heat 15 Minutes   Moist Heat Location --  back   Electrical Stimulation   Electrical Stimulation Location  lumbar   Electrical Stimulation Action IFC   Electrical Stimulation Parameters 15   Electrical Stimulation Goals Pain                  PT Short Term Goals - 09/28/14 1224    PT SHORT TERM GOAL #1   Title be independent in initial HEP   Time 4   Period Weeks   Status Achieved   PT SHORT TERM GOAL #2   Title report a 25% reduction in thoracic and lumbar pain with driving  42%   Time 4   Period Weeks   Status Achieved   PT SHORT TERM GOAL #3   Title stand for 20 minutes without need to rest   Time 4   Period Weeks   Status Achieved           PT Long Term Goals - 09/28/14 1236    PT LONG TERM GOAL #1   Title be independent in final HEP   Time 8   Period Weeks   Status On-going   PT LONG TERM GOAL #2   Title reduce FOTO to < or = to 40% limitation   Time 8   Period Weeks   Status On-going   PT LONG TERM GOAL #3   Title report a 50% reduction in lumbar and thoracic pain with driving   Time 8   Period Weeks   Status On-going   PT LONG TERM GOAL #4   Title report < or = to 4/10 thoracic pain and lumbar pain after standing for housework   Time 8   Period Weeks   Status On-going               Plan - 09/28/14 1218    Clinical Impression Statement Pt able to tolerate activities in PT despite the limitations due to left wrist fracture   Pt will benefit from skilled therapeutic intervention in order to improve on the following deficits Improper body mechanics;Decreased endurance;Postural dysfunction;Pain;Decreased activity tolerance   Rehab Potential Good   PT Frequency 2x / week   PT Duration 8 weeks   PT Treatment/Interventions ADLs/Self Care Home Management;Moist Heat;Therapeutic activities;Therapeutic exercise;Ultrasound;Manual techniques;Neuromuscular re-education;Electrical Stimulation   PT Next Visit Plan TA activation, endurance, flexibility.  Modalities as needed.     Consulted and Agree with Plan of Care Patient        Problem  List Patient Active Problem List  Diagnosis Date Noted  . Essential hypertension 04/02/2013  . Hyperlipidemia   . History of Morbid obesity - status post gastric bypass surgery   . Other abnormal glucose 10/25/2011  . Other malaise and fatigue 10/25/2011  . Myalgia and myositis, unspecified 10/25/2011  . Degeneration of lumbar or lumbosacral intervertebral disc 10/25/2011  . Degeneration of cervical intervertebral disc 10/25/2011  . Uncontrolled hypertension as indication for native nephrectomy 10/25/2011  . OSA on CPAP 10/25/2011  . GERD (gastroesophageal reflux disease) 10/25/2011  . IBS (irritable bowel syndrome) 10/25/2011  . COPD (chronic obstructive pulmonary disease) 10/25/2011  . Other affections of shoulder region, not elsewhere classified 06/26/2011  . Post herpetic neuralgia 09/16/2003    NAUMANN-HOUEGNIFIO,Iseah Plouff PTA 09/28/2014, 12:40 PM  [facility] [address], [place], [place], [address] Phone: [phone]   Fax:  [phone]

## 2014-09-29 ENCOUNTER — Other Ambulatory Visit (HOSPITAL_COMMUNITY): Payer: Self-pay | Admitting: Urology

## 2014-09-29 DIAGNOSIS — N1330 Unspecified hydronephrosis: Secondary | ICD-10-CM

## 2014-09-30 ENCOUNTER — Encounter: Payer: Self-pay | Admitting: Physical Therapy

## 2014-09-30 ENCOUNTER — Ambulatory Visit: Payer: BLUE CROSS/BLUE SHIELD | Admitting: Physical Therapy

## 2014-09-30 DIAGNOSIS — M545 Low back pain, unspecified: Secondary | ICD-10-CM

## 2014-09-30 DIAGNOSIS — M546 Pain in thoracic spine: Secondary | ICD-10-CM

## 2014-09-30 DIAGNOSIS — R6889 Other general symptoms and signs: Secondary | ICD-10-CM

## 2014-09-30 NOTE — Patient Instructions (Signed)

## 2014-09-30 NOTE — Therapy (Signed)
[facility] [address] [place], [place], [address] Phone: [phone]   Fax:  [phone]  Physical Therapy Treatment  Patient Details  Name: Michaela Raymond MRN: [id number] Date of Birth: 21-Jun-1955 Referring Provider:  Nena Polio, NP  Encounter Date: 09/30/2014      PT End of Session - 09/30/14 1652    Visit Number 6   Date for PT Re-Evaluation 11/10/14   PT Start Time 1624   PT Stop Time 1706   PT Time Calculation (min) 42 min   Activity Tolerance Patient tolerated treatment well   Behavior During Therapy [facility] for tasks assessed/performed      Past Medical History  Diagnosis Date  . Dysrhythmia   . Angina November 2011    Normal coronary angiogram; noncardiac symptom  . Heart murmur   . Asthma   . Hypertension   . COPD (chronic obstructive pulmonary disease)   . Bronchitis   . Pneumonia   . Sleep apnea     cpap machine x 9 years  . Hypothyroidism   . Adenoma     of thyroid gland, benign  . Chronic kidney disease     cyst on right kidney hx of  . Barrett's esophagus   . GERD (gastroesophageal reflux disease)   . Headache(784.0)   . Fibromyalgia   . Arthritis     osteoarthrits  . Depression   . Anxiety   . Diabetes mellitus   . Hyperlipidemia   . Morbid obesity     Status post gastric bypass surgery September 2013  . Osteoporosis   . Osteopenia     Past Surgical History  Procedure Laterality Date  . Knee arthroplasty      bilaterally  . Back surgery      spinal fusion  . Thyroidectomy    . Rotator cuff repair      2001  . Joint replacement    . Tonsillectomy      and adnoids removed  . Dilation and curettage of uterus    . Cardiac catheterization  05/03/2010    No significant CAD in L coronary system, normal LV function  . Nasal sinus surgery      1989 and mucous cele removed  . Esophagogastroduodenoscopy    . Colonoscopy    . Cardiovascular stress test  04/15/2007     EKG negative for ischemia, no significant ischemia demonstrated.  . Transthoracic echocardiogram  05/03/2021    EF 55-65%, normal  . Laparoscopic roux-en-y gastric bypass with upper endoscopy and removal of lap band      There were no vitals filed for this visit.  Visit Diagnosis:  Bilateral low back pain without sciatica  Bilateral thoracic back pain  Activity intolerance                     [facility] Adult PT Treatment/Exercise - 09/30/14 0001    Lumbar Exercises: Stretches   Active Hamstring Stretch 3 reps;20 seconds  in sitting   Single Knee to Chest Stretch 3 reps;20 seconds   Lumbar Exercises: Aerobic   Stationary Bike Level 2 x 68min   Lumbar Exercises: Supine   Other Supine Lumbar Exercises TA activation with unilateral ER, marching and leg slides    Other Supine Lumbar Exercises foam roll x 2 min and horzontal abduction with yellow band 2x10   Modalities   Modalities Electrical Stimulation   Moist Heat Therapy   Number Minutes Moist Heat 15 Minutes  Moist Heat Location Other (comment)   Copy Action IFC   Electrical Stimulation Parameters 15   Electrical Stimulation Goals Pain                PT Education - 09/30/14 1642    Education provided Yes   Education Details TA activation with ER, marching, les slide   Person(s) Educated Patient   Methods Explanation;Handout;Tactile cues   Comprehension Verbalized understanding;Returned demonstration          PT Short Term Goals - 09/28/14 1224    PT SHORT TERM GOAL #1   Title be independent in initial HEP   Time 4   Period Weeks   Status Achieved   PT SHORT TERM GOAL #2   Title report a 25% reduction in thoracic and lumbar pain with driving  63%   Time 4   Period Weeks   Status Achieved   PT SHORT TERM GOAL #3   Title stand for 20 minutes without need to rest   Time 4   Period Weeks   Status Achieved            PT Long Term Goals - 09/28/14 1236    PT LONG TERM GOAL #1   Title be independent in final HEP   Time 8   Period Weeks   Status On-going   PT LONG TERM GOAL #2   Title reduce FOTO to < or = to 40% limitation   Time 8   Period Weeks   Status On-going   PT LONG TERM GOAL #3   Title report a 50% reduction in lumbar and thoracic pain with driving   Time 8   Period Weeks   Status On-going   PT LONG TERM GOAL #4   Title report < or = to 4/10 thoracic pain and lumbar pain after standing for housework   Time 8   Period Weeks   Status On-going               Plan - 09/30/14 1652    Pt will benefit from skilled therapeutic intervention in order to improve on the following deficits Improper body mechanics;Decreased endurance;Postural dysfunction;Pain;Decreased activity tolerance   Rehab Potential Good   PT Frequency 2x / week   PT Duration 8 weeks   PT Treatment/Interventions ADLs/Self Care Home Management;Moist Heat;Therapeutic activities;Therapeutic exercise;Ultrasound;Manual techniques;Neuromuscular re-education;Electrical Stimulation   PT Next Visit Plan review TA activation, increase endurance, flexibility, modalities   PT Home Exercise Plan continue with established HEP   Consulted and Agree with Plan of Care Patient        Problem List Patient Active Problem List   Diagnosis Date Noted  . Essential hypertension 04/02/2013  . Hyperlipidemia   . History of Morbid obesity - status post gastric bypass surgery   . Other abnormal glucose 10/25/2011  . Other malaise and fatigue 10/25/2011  . Myalgia and myositis, unspecified 10/25/2011  . Degeneration of lumbar or lumbosacral intervertebral disc 10/25/2011  . Degeneration of cervical intervertebral disc 10/25/2011  . Unspecified essential hypertension 10/25/2011  . OSA on CPAP 10/25/2011  . GERD (gastroesophageal reflux disease) 10/25/2011  . IBS (irritable bowel syndrome) 10/25/2011  . COPD (chronic  obstructive pulmonary disease) 10/25/2011  . Other affections of shoulder region, not elsewhere classified 06/26/2011  . Post herpetic neuralgia 09/16/2003    NAUMANN-HOUEGNIFIO,Zev Blue PTA 09/30/2014, 5:04 PM  [facility] [address]  [place], [place], [address] Phone: [phone]   Fax:  [phone]

## 2014-10-05 ENCOUNTER — Encounter: Payer: Self-pay | Admitting: Physical Therapy

## 2014-10-05 ENCOUNTER — Ambulatory Visit: Payer: BLUE CROSS/BLUE SHIELD | Admitting: Physical Therapy

## 2014-10-05 DIAGNOSIS — M545 Low back pain, unspecified: Secondary | ICD-10-CM

## 2014-10-05 DIAGNOSIS — R6889 Other general symptoms and signs: Secondary | ICD-10-CM

## 2014-10-05 DIAGNOSIS — M546 Pain in thoracic spine: Secondary | ICD-10-CM

## 2014-10-05 NOTE — Therapy (Signed)
[facility] [address] [place], [place], [address] Phone: [phone]   Fax:  [phone]  Physical Therapy Treatment  Patient Details  Name: Michaela Raymond MRN: [id number] Date of Birth: [birth year] Referring Provider:  Nena Polio, NP  Encounter Date: 10/05/2014      PT End of Session - 10/05/14 1211    Visit Number 7   Date for PT Re-Evaluation 11/10/14   PT Start Time 6701   PT Stop Time 1247   PT Time Calculation (min) 62 min   Activity Tolerance Patient tolerated treatment well   Behavior During Therapy [facility] for tasks assessed/performed      Past Medical History  Diagnosis Date  . Dysrhythmia   . Angina November 2011    Normal coronary angiogram; noncardiac symptom  . Heart murmur   . Asthma   . Hypertension   . COPD (chronic obstructive pulmonary disease)   . Bronchitis   . Pneumonia   . Sleep apnea     cpap machine x 9 years  . Hypothyroidism   . Adenoma     of thyroid gland, benign  . Chronic kidney disease     cyst on right kidney hx of  . Barrett's esophagus   . GERD (gastroesophageal reflux disease)   . Headache(784.0)   . Fibromyalgia   . Arthritis     osteoarthrits  . Depression   . Anxiety   . Diabetes mellitus   . Hyperlipidemia   . Morbid obesity     Status post gastric bypass surgery September 2013  . Osteoporosis   . Osteopenia     Past Surgical History  Procedure Laterality Date  . Knee arthroplasty      bilaterally  . Back surgery      spinal fusion  . Thyroidectomy    . Rotator cuff repair      2001  . Joint replacement    . Tonsillectomy      and adnoids removed  . Dilation and curettage of uterus    . Cardiac catheterization  05/03/2010    No significant CAD in L coronary system, normal LV function  . Nasal sinus surgery      1989 and mucous cele removed  . Esophagogastroduodenoscopy    . Colonoscopy    . Cardiovascular stress test  04/15/2007     EKG negative for ischemia, no significant ischemia demonstrated.  . Transthoracic echocardiogram  05/03/2021    EF 55-65%, normal  . Laparoscopic roux-en-y gastric bypass with upper endoscopy and removal of lap band      There were no vitals filed for this visit.  Visit Diagnosis:  Bilateral low back pain without sciatica  Bilateral thoracic back pain  Activity intolerance      Subjective Assessment - 10/05/14 1150    Subjective Pt feels better, her left wrist is improving   Pertinent History Lumbar fusion: L4-S1 in 2010   Limitations Standing;Walking   How long can you stand comfortably? 10   How long can you walk comfortably? 30 minutes max.   Diagnostic tests  MRI shows more narrowing compare to years ago, and enlargement right renal - has appointment to address this    Patient Stated Goals Stand longer, less pain   Currently in Pain? Yes   Pain Score 4    Pain Location Back   Pain Orientation Right;Left;Lower   Pain Descriptors / Indicators Aching;Discomfort   Pain Type Chronic pain   Pain Onset  More than a month ago   Pain Frequency Constant   Aggravating Factors  Prolonged sitting   Pain Relieving Factors heat, massage   Multiple Pain Sites No                         [facility] Adult PT Treatment/Exercise - 10/05/14 0001    Lumbar Exercises: Stretches   Active Hamstring Stretch 3 reps;20 seconds  in standing at stairs   Single Knee to Chest Stretch 3 reps;20 seconds   Lumbar Exercises: Aerobic   Stationary Bike Level 2 x 58min   UBE (Upper Arm Bike) L 1 x 7min backwards, sitting on green ball   Lumbar Exercises: Supine   Other Supine Lumbar Exercises TA activation with unilateral ER, marching and leg slides    Other Supine Lumbar Exercises foam roll x 2 min and horzontal abduction with yellow band 2x10   Modalities   Modalities Electrical Stimulation   Moist Heat Therapy   Number Minutes Moist Heat 15 Minutes   Moist Heat Location Other (comment)    Electrical Stimulation   Electrical Stimulation Location lumbar   Electrical Stimulation Action IFC   Electrical Stimulation Parameters 15   Electrical Stimulation Goals Pain                  PT Short Term Goals - 09/28/14 1224    PT SHORT TERM GOAL #1   Title be independent in initial HEP   Time 4   Period Weeks   Status Achieved   PT SHORT TERM GOAL #2   Title report a 25% reduction in thoracic and lumbar pain with driving  29%   Time 4   Period Weeks   Status Achieved   PT SHORT TERM GOAL #3   Title stand for 20 minutes without need to rest   Time 4   Period Weeks   Status Achieved           PT Long Term Goals - 10/05/14 1213    PT LONG TERM GOAL #1   Title be independent in final HEP   Time 8   Period Weeks   Status On-going   PT LONG TERM GOAL #2   Title reduce FOTO to < or = to 40% limitation   Time 8   Period Weeks   Status On-going   PT LONG TERM GOAL #3   Title report a 50% reduction in lumbar and thoracic pain with driving   Time 8   Period Weeks   Status On-going   PT LONG TERM GOAL #4   Title report < or = to 4/10 thoracic pain and lumbar pain after standing for housework   Time 8   Period Weeks   Status On-going               Plan - 10/05/14 1212    Pt will benefit from skilled therapeutic intervention in order to improve on the following deficits Improper body mechanics;Decreased endurance;Postural dysfunction;Pain;Decreased activity tolerance   Rehab Potential Good   PT Frequency 2x / week   PT Duration 8 weeks   PT Treatment/Interventions ADLs/Self Care Home Management;Moist Heat;Therapeutic activities;Therapeutic exercise;Ultrasound;Manual techniques;Neuromuscular re-education;Electrical Stimulation   PT Next Visit Plan review TA activation, increase endurance, flexibility, modalities   PT Home Exercise Plan continue with established HEP   Consulted and Agree with Plan of Care Patient        Problem  List Patient Active Problem List  Diagnosis Date Noted  . Essential hypertension 04/02/2013  . Hyperlipidemia   . History of Morbid obesity - status post gastric bypass surgery   . Other abnormal glucose 10/25/2011  . Other malaise and fatigue 10/25/2011  . Myalgia and myositis, unspecified 10/25/2011  . Degeneration of lumbar or lumbosacral intervertebral disc 10/25/2011  . Degeneration of cervical intervertebral disc 10/25/2011  . Unspecified essential hypertension 10/25/2011  . OSA on CPAP 10/25/2011  . GERD (gastroesophageal reflux disease) 10/25/2011  . IBS (irritable bowel syndrome) 10/25/2011  . COPD (chronic obstructive pulmonary disease) 10/25/2011  . Other affections of shoulder region, not elsewhere classified 06/26/2011  . Post herpetic neuralgia 09/16/2003    NAUMANN-HOUEGNIFIO,Darel Ricketts PTA 10/05/2014, 12:32 PM  [facility] [address], [place], [place], [address] Phone: [phone]   Fax:  [phone]

## 2014-10-14 ENCOUNTER — Encounter: Payer: Self-pay | Admitting: Physical Therapy

## 2014-10-14 ENCOUNTER — Ambulatory Visit: Payer: BLUE CROSS/BLUE SHIELD | Admitting: Physical Therapy

## 2014-10-14 DIAGNOSIS — M545 Low back pain, unspecified: Secondary | ICD-10-CM

## 2014-10-14 DIAGNOSIS — R6889 Other general symptoms and signs: Secondary | ICD-10-CM

## 2014-10-14 DIAGNOSIS — M546 Pain in thoracic spine: Secondary | ICD-10-CM

## 2014-10-14 NOTE — Patient Instructions (Signed)
Cat and Cow        You can perform in quadruped   With your wrist left arm stay on your forearms                                 You can perform in sitting   CAT move your spine out of the body   -  exhalation Cow move your spine into the body.     - inhalation  Repeat  10  times per set.  Do 1-2 sessions per day.  Child Pose  Sitting on knees, fold body over legs and relax head and arms on floor. Hold for _20___seconds.  Do 3 reps.  1-2 times a day. Variation with long hold 2 min up to 4 to address deeper layers of tissue  Copyright  VHI. All rights reserved.  Side Waist Stretch from Child's Pose  From child's pose, walk hands to left. Reach right hand out on diagonal. Reach hips back toward heels making a C with torso. Breathe into right side waist. Hold for _20_seconds  Repeat _3___ times each side.  1-2 times a day. Same here you can practice longer holds  Copyright  VHI. All rights reserved.

## 2014-10-14 NOTE — Therapy (Signed)
[facility] [address] [place], [place], [address] Phone: [phone]   Fax:  [phone]  Physical Therapy Treatment  Patient Details  Name: Michaela Raymond MRN: [id number] Date of Birth: [birth year] Referring Provider:  Nena Polio, NP  Encounter Date: 10/14/2014      PT End of Session - 10/14/14 1158    Visit Number 8   Date for PT Re-Evaluation 11/10/14   PT Start Time 1148   PT Stop Time 1249   PT Time Calculation (min) 61 min   Activity Tolerance Patient tolerated treatment well   Behavior During Therapy [facility] for tasks assessed/performed      Past Medical History  Diagnosis Date  . Dysrhythmia   . Angina November 2011    Normal coronary angiogram; noncardiac symptom  . Heart murmur   . Asthma   . Hypertension   . COPD (chronic obstructive pulmonary disease)   . Bronchitis   . Pneumonia   . Sleep apnea     cpap machine x 9 years  . Hypothyroidism   . Adenoma     of thyroid gland, benign  . Chronic kidney disease     cyst on right kidney hx of  . Barrett's esophagus   . GERD (gastroesophageal reflux disease)   . Headache(784.0)   . Fibromyalgia   . Arthritis     osteoarthrits  . Depression   . Anxiety   . Diabetes mellitus   . Hyperlipidemia   . Morbid obesity     Status post gastric bypass surgery September 2013  . Osteoporosis   . Osteopenia     Past Surgical History  Procedure Laterality Date  . Knee arthroplasty      bilaterally  . Back surgery      spinal fusion  . Thyroidectomy    . Rotator cuff repair      2001  . Joint replacement    . Tonsillectomy      and adnoids removed  . Dilation and curettage of uterus    . Cardiac catheterization  05/03/2010    No significant CAD in L coronary system, normal LV function  . Nasal sinus surgery      1989 and mucous cele removed  . Esophagogastroduodenoscopy    . Colonoscopy    . Cardiovascular stress test  04/15/2007   EKG negative for ischemia, no significant ischemia demonstrated.  . Transthoracic echocardiogram  05/03/2021    EF 55-65%, normal  . Laparoscopic roux-en-y gastric bypass with upper endoscopy and removal of lap band      There were no vitals filed for this visit.  Visit Diagnosis:  Bilateral low back pain without sciatica  Bilateral thoracic back pain  Activity intolerance                       [facility] Adult PT Treatment/Exercise - 10/14/14 0001    Lumbar Exercises: Stretches   Active Hamstring Stretch 3 reps;20 seconds   Single Knee to Chest Stretch 3 reps;20 seconds   Lumbar Exercises: Aerobic   Stationary Bike Level 2 x 72min   UBE (Upper Arm Bike) L 1 x 80min backwards, sitting on green ball   Lumbar Exercises: Supine   Other Supine Lumbar Exercises foam roll x 2 min and horzontal abduction with yellow band 2x10   Lumbar Exercises: Quadruped   Madcat/Old Horse 10 reps  in supine and sitting   Other Quadruped Lumbar Exercises child position x  2 min    Modalities   Modalities Electrical Stimulation   Moist Heat Therapy   Number Minutes Moist Heat 15 Minutes   Moist Heat Location Other (comment)   Electrical Stimulation   Electrical Stimulation Location lumbar   Electrical Stimulation Action IFC   Electrical Stimulation Parameters 15   Electrical Stimulation Goals Pain                PT Education - 10/14/14 1221    Education provided Yes   Education Details Childs pose and Cat and Cow   Person(s) Educated Patient   Methods Explanation;Demonstration;Handout   Comprehension Verbalized understanding;Returned demonstration          PT Short Term Goals - 10/14/14 1205    PT SHORT TERM GOAL #1   Title be independent in initial HEP   Time 4   Period Weeks   Status Achieved   PT SHORT TERM GOAL #2   Title report a 25% reduction in thoracic and lumbar pain with driving   Time 4   Period Weeks   Status Achieved   PT SHORT TERM GOAL #3    Title stand for 20 minutes without need to rest   Time 4   Period Weeks   Status Achieved           PT Long Term Goals - 10/14/14 1205    PT LONG TERM GOAL #1   Title be independent in final HEP   Time 8   Period Weeks   Status On-going   PT LONG TERM GOAL #2   Title reduce FOTO to < or = to 40% limitation   Time 8   Period Weeks   Status On-going   PT LONG TERM GOAL #3   Title report a 50% reduction in lumbar and thoracic pain with driving   Time 8   Period Weeks   Status On-going   PT LONG TERM GOAL #4   Title report < or = to 4/10 thoracic pain and lumbar pain after standing for housework   Time 8   Period Weeks   Status On-going               Plan - 10/14/14 1200    Clinical Impression Statement Pt able to tolerate activities in Pt despite the limitations due to wrist fracture   Pt will benefit from skilled therapeutic intervention in order to improve on the following deficits Improper body mechanics;Decreased endurance;Postural dysfunction;Pain;Decreased activity tolerance   Rehab Potential Good   PT Frequency 2x / week   PT Duration 8 weeks   PT Treatment/Interventions ADLs/Self Care Home Management;Moist Heat;Therapeutic activities;Therapeutic exercise;Ultrasound;Manual techniques;Neuromuscular re-education;Electrical Stimulation   PT Next Visit Plan review TA activation, increase endurance, flexibility, modalities   PT Home Exercise Plan continue with established HEP   Consulted and Agree with Plan of Care Patient        Problem List Patient Active Problem List   Diagnosis Date Noted  . Essential hypertension 04/02/2013  . Hyperlipidemia   . History of Morbid obesity - status post gastric bypass surgery   . Other abnormal glucose 10/25/2011  . Other malaise and fatigue 10/25/2011  . Myalgia and myositis, unspecified 10/25/2011  . Degeneration of lumbar or lumbosacral intervertebral disc 10/25/2011  . Degeneration of cervical intervertebral  disc 10/25/2011  . Unspecified essential hypertension 10/25/2011  . OSA on CPAP 10/25/2011  . GERD (gastroesophageal reflux disease) 10/25/2011  . IBS (irritable bowel syndrome) 10/25/2011  . COPD (chronic  obstructive pulmonary disease) 10/25/2011  . Other affections of shoulder region, not elsewhere classified 06/26/2011  . Post herpetic neuralgia 09/16/2003    NAUMANN-HOUEGNIFIO,Ginia Rudell PTA 10/14/2014, 1:30 PM  [facility] [address], [place], [place], [address] Phone: [phone]   Fax:  [phone]

## 2014-10-19 ENCOUNTER — Encounter: Payer: Self-pay | Admitting: Physical Therapy

## 2014-10-19 ENCOUNTER — Telehealth: Payer: Self-pay | Admitting: Physical Therapy

## 2014-10-19 ENCOUNTER — Ambulatory Visit: Payer: BLUE CROSS/BLUE SHIELD | Admitting: Physical Therapy

## 2014-10-19 ENCOUNTER — Ambulatory Visit: Payer: BLUE CROSS/BLUE SHIELD | Attending: Physical Medicine and Rehabilitation | Admitting: Physical Therapy

## 2014-10-19 DIAGNOSIS — M545 Low back pain, unspecified: Secondary | ICD-10-CM

## 2014-10-19 DIAGNOSIS — Z981 Arthrodesis status: Secondary | ICD-10-CM | POA: Insufficient documentation

## 2014-10-19 DIAGNOSIS — J45909 Unspecified asthma, uncomplicated: Secondary | ICD-10-CM | POA: Insufficient documentation

## 2014-10-19 DIAGNOSIS — E785 Hyperlipidemia, unspecified: Secondary | ICD-10-CM | POA: Insufficient documentation

## 2014-10-19 DIAGNOSIS — M546 Pain in thoracic spine: Secondary | ICD-10-CM | POA: Diagnosis not present

## 2014-10-19 DIAGNOSIS — R6889 Other general symptoms and signs: Secondary | ICD-10-CM | POA: Diagnosis not present

## 2014-10-19 DIAGNOSIS — K219 Gastro-esophageal reflux disease without esophagitis: Secondary | ICD-10-CM | POA: Diagnosis not present

## 2014-10-19 DIAGNOSIS — I10 Essential (primary) hypertension: Secondary | ICD-10-CM | POA: Insufficient documentation

## 2014-10-19 DIAGNOSIS — M8580 Other specified disorders of bone density and structure, unspecified site: Secondary | ICD-10-CM | POA: Insufficient documentation

## 2014-10-19 DIAGNOSIS — M797 Fibromyalgia: Secondary | ICD-10-CM | POA: Diagnosis not present

## 2014-10-19 DIAGNOSIS — J449 Chronic obstructive pulmonary disease, unspecified: Secondary | ICD-10-CM | POA: Insufficient documentation

## 2014-10-19 NOTE — Therapy (Signed)
[facility] [address], [place], [place], [address] Phone: [phone]   Fax:  [phone]  Physical Therapy Treatment  Patient Details  Name: Michaela Raymond MRN: [id number] Date of Birth: [birth year] Referring Provider:  Magnus Sinning, MD  Encounter Date: 10/19/2014      PT End of Session - 10/19/14 1535    Visit Number 9   Date for PT Re-Evaluation 11/10/14   PT Start Time 1638   PT Stop Time 1550   PT Time Calculation (min) 54 min   Activity Tolerance Patient tolerated treatment well   Behavior During Therapy [facility] for tasks assessed/performed      Past Medical History  Diagnosis Date  . Dysrhythmia   . Angina November 2011    Normal coronary angiogram; noncardiac symptom  . Heart murmur   . Asthma   . Hypertension   . COPD (chronic obstructive pulmonary disease)   . Bronchitis   . Pneumonia   . Sleep apnea     cpap machine x 9 years  . Hypothyroidism   . Adenoma     of thyroid gland, benign  . Chronic kidney disease     cyst on right kidney hx of  . Barrett's esophagus   . GERD (gastroesophageal reflux disease)   . Headache(784.0)   . Fibromyalgia   . Arthritis     osteoarthrits  . Depression   . Anxiety   . Diabetes mellitus   . Hyperlipidemia   . Morbid obesity     Status post gastric bypass surgery September 2013  . Osteoporosis   . Osteopenia     Past Surgical History  Procedure Laterality Date  . Knee arthroplasty      bilaterally  . Back surgery      spinal fusion  . Thyroidectomy    . Rotator cuff repair      2001  . Joint replacement    . Tonsillectomy      and adnoids removed  . Dilation and curettage of uterus    . Cardiac catheterization  05/03/2010    No significant CAD in L coronary system, normal LV function  . Nasal sinus surgery      1989 and mucous cele removed  . Esophagogastroduodenoscopy    . Colonoscopy    . Cardiovascular stress test  04/15/2007   EKG negative for ischemia, no significant ischemia demonstrated.  . Transthoracic echocardiogram  05/03/2021    EF 55-65%, normal  . Laparoscopic roux-en-y gastric bypass with upper endoscopy and removal of lap band      There were no vitals filed for this visit.  Visit Diagnosis:  Bilateral low back pain without sciatica  Bilateral thoracic back pain  Activity intolerance      Subjective Assessment - 10/19/14 1457    Subjective x-ray was taken yesterday, writs is healing well but still needs to be careful with lifting or weightbearing   Pertinent History Lumbar fusion: L4-S1 in 2010   Limitations Standing;Walking   How long can you stand comfortably? 10   How long can you walk comfortably? 30 minutes max.   Diagnostic tests  MRI shows more narrowing compare to years ago, and enlargement right renal - has appointment to address this    Patient Stated Goals Stand longer, less pain   Pain Score 5    Pain Location Back   Pain Orientation Right;Left;Lower   Pain Descriptors / Indicators Aching;Discomfort   Pain Type Chronic pain  Pain Onset More than a month ago   Pain Frequency Constant   Aggravating Factors  prolonged sitting   Pain Relieving Factors heat, masseage   Multiple Pain Sites No                         [facility] Adult PT Treatment/Exercise - 10/19/14 0001    Lumbar Exercises: Stretches   Active Hamstring Stretch 3 reps;20 seconds   Single Knee to Chest Stretch 3 reps;20 seconds   Lumbar Exercises: Aerobic   Stationary Bike Level 2 x 32min   UBE (Upper Arm Bike) L 1 x 60min backwards, sitting on green ball   Lumbar Exercises: Supine   Other Supine Lumbar Exercises foam roll x 2 min and horzontal abduction with yellow band 2x10   Lumbar Exercises: Quadruped   Madcat/Old Horse 10 reps   Other Quadruped Lumbar Exercises child position x 2 min    Modalities   Modalities Electrical Stimulation   Moist Heat Therapy   Number Minutes Moist Heat 15  Minutes   Moist Heat Location Other (comment)   Electrical Stimulation   Electrical Stimulation Location lumbar   Electrical Stimulation Action IFC   Electrical Stimulation Parameters 15   Electrical Stimulation Goals Pain                  PT Short Term Goals - 10/19/14 1514    PT SHORT TERM GOAL #1   Title be independent in initial HEP   Time 4   Period Weeks   Status Achieved   PT SHORT TERM GOAL #2   Title report a 25% reduction in thoracic and lumbar pain with driving   Time 4   Period Weeks   Status Achieved   PT SHORT TERM GOAL #3   Title stand for 20 minutes without need to rest   Time 4   Period Weeks   Status Achieved           PT Long Term Goals - 10/19/14 1536    PT LONG TERM GOAL #1   Title be independent in final HEP   Time 8   Period Weeks   Status On-going   PT LONG TERM GOAL #2   Title reduce FOTO to < or = to 40% limitation   Time 8   Period Weeks   Status On-going   PT LONG TERM GOAL #3   Title report a 50% reduction in lumbar and thoracic pain with driving   Time 8   Period Weeks   Status On-going   PT LONG TERM GOAL #4   Title report < or = to 4/10 thoracic pain and lumbar pain after standing for housework   Time 8   Period Weeks   Status On-going               Plan - 10/19/14 1538    Clinical Impression Statement Pt continues to benefit from skilled PT   Pt will benefit from skilled therapeutic intervention in order to improve on the following deficits Improper body mechanics;Decreased endurance;Postural dysfunction;Pain;Decreased activity tolerance   Rehab Potential Good   PT Frequency 2x / week   PT Duration 8 weeks   PT Treatment/Interventions ADLs/Self Care Home Management;Moist Heat;Therapeutic activities;Therapeutic exercise;Ultrasound;Manual techniques;Neuromuscular re-education;Electrical Stimulation   PT Next Visit Plan review TA activation, increase endurance, flexibility, modalities   PT Home Exercise  Plan continue with established HEP   Consulted and Agree with Plan of Care Patient  Problem List Patient Active Problem List   Diagnosis Date Noted  . Essential hypertension 04/02/2013  . Hyperlipidemia   . History of Morbid obesity - status post gastric bypass surgery   . Other abnormal glucose 10/25/2011  . Other malaise and fatigue 10/25/2011  . Myalgia and myositis, unspecified 10/25/2011  . Degeneration of lumbar or lumbosacral intervertebral disc 10/25/2011  . Degeneration of cervical intervertebral disc 10/25/2011  . Unspecified essential hypertension 10/25/2011  . OSA on CPAP 10/25/2011  . GERD (gastroesophageal reflux disease) 10/25/2011  . IBS (irritable bowel syndrome) 10/25/2011  . COPD (chronic obstructive pulmonary disease) 10/25/2011  . Other affections of shoulder region, not elsewhere classified 06/26/2011  . Post herpetic neuralgia 09/16/2003    NAUMANN-HOUEGNIFIO,Susa Bones PTA 10/19/2014, 3:39 PM  [facility] [address] [place], [place], [address] Phone: [phone]   Fax:  [phone]

## 2014-10-21 ENCOUNTER — Encounter: Payer: Self-pay | Admitting: Physical Therapy

## 2014-10-21 ENCOUNTER — Ambulatory Visit: Payer: BLUE CROSS/BLUE SHIELD | Admitting: Physical Therapy

## 2014-10-21 DIAGNOSIS — M545 Low back pain, unspecified: Secondary | ICD-10-CM

## 2014-10-21 DIAGNOSIS — R6889 Other general symptoms and signs: Secondary | ICD-10-CM

## 2014-10-21 DIAGNOSIS — M546 Pain in thoracic spine: Secondary | ICD-10-CM

## 2014-10-21 NOTE — Therapy (Signed)
[facility] [address] [place], [place], [address] Phone: [phone]   Fax:  [phone]  Physical Therapy Treatment  Patient Details  Name: Michaela Raymond MRN: [id number] Date of Birth: 10/28/1955 Referring Provider:  Nena Polio, NP  Encounter Date: 10/21/2014      PT End of Session - 10/21/14 1324    Visit Number 10   Date for PT Re-Evaluation 11/10/14   PT Start Time 1232   PT Stop Time 1333   PT Time Calculation (min) 61 min   Activity Tolerance Patient tolerated treatment well   Behavior During Therapy [facility] for tasks assessed/performed      Past Medical History  Diagnosis Date  . Dysrhythmia   . Angina November 2011    Normal coronary angiogram; noncardiac symptom  . Heart murmur   . Asthma   . Hypertension   . COPD (chronic obstructive pulmonary disease)   . Bronchitis   . Pneumonia   . Sleep apnea     cpap machine x 9 years  . Hypothyroidism   . Adenoma     of thyroid gland, benign  . Chronic kidney disease     cyst on right kidney hx of  . Barrett's esophagus   . GERD (gastroesophageal reflux disease)   . Headache(784.0)   . Fibromyalgia   . Arthritis     osteoarthrits  . Depression   . Anxiety   . Diabetes mellitus   . Hyperlipidemia   . Morbid obesity     Status post gastric bypass surgery September 2013  . Osteoporosis   . Osteopenia     Past Surgical History  Procedure Laterality Date  . Knee arthroplasty      bilaterally  . Back surgery      spinal fusion  . Thyroidectomy    . Rotator cuff repair      2001  . Joint replacement    . Tonsillectomy      and adnoids removed  . Dilation and curettage of uterus    . Cardiac catheterization  05/03/2010    No significant CAD in L coronary system, normal LV function  . Nasal sinus surgery      1989 and mucous cele removed  . Esophagogastroduodenoscopy    . Colonoscopy    . Cardiovascular stress test  04/15/2007   EKG negative for ischemia, no significant ischemia demonstrated.  . Transthoracic echocardiogram  05/03/2021    EF 55-65%, normal  . Laparoscopic roux-en-y gastric bypass with upper endoscopy and removal of lap band      There were no vitals filed for this visit.  Visit Diagnosis:  Bilateral thoracic back pain  Activity intolerance  Bilateral low back pain without sciatica      Subjective Assessment - 10/21/14 1242    Subjective  Pt had cyst removed on left elbow this morning    Pertinent History Lumbar fusion: L4-S1 in 2010   Limitations Standing;Walking   Currently in Pain? Yes   Pain Score 6    Pain Location Back   Pain Orientation Right;Left;Lower   Pain Descriptors / Indicators Aching;Discomfort   Pain Type Chronic pain   Pain Onset More than a month ago   Pain Frequency Constant   Aggravating Factors  prolonged sitting   Pain Relieving Factors heat, masssage   Multiple Pain Sites No  [facility] Adult PT Treatment/Exercise - 10/21/14 0001    Lumbar Exercises: Stretches   Active Hamstring Stretch 3 reps;20 seconds  in sitting   Lumbar Exercises: Aerobic   Stationary Bike Level 2 x 56min   Lumbar Exercises: Supine   Other Supine Lumbar Exercises TA activation with unilateral ER, marching and leg slides    Other Supine Lumbar Exercises foam roll x 3 min    Lumbar Exercises: Quadruped   Other Quadruped Lumbar Exercises child position x 2 min with pillows   Modalities   Modalities Electrical Stimulation   Moist Heat Therapy   Number Minutes Moist Heat 15 Minutes   Moist Heat Location Other (comment)   Electrical Stimulation   Electrical Stimulation Location lumbar   Electrical Stimulation Action IFC   Electrical Stimulation Parameters 15   Electrical Stimulation Goals Pain                  PT Short Term Goals - 10/19/14 1514    PT SHORT TERM GOAL #1   Title be independent in initial HEP   Time 4   Period Weeks    Status Achieved   PT SHORT TERM GOAL #2   Title report a 25% reduction in thoracic and lumbar pain with driving   Time 4   Period Weeks   Status Achieved   PT SHORT TERM GOAL #3   Title stand for 20 minutes without need to rest   Time 4   Period Weeks   Status Achieved           PT Long Term Goals - 10/19/14 1536    PT LONG TERM GOAL #1   Title be independent in final HEP   Time 8   Period Weeks   Status On-going   PT LONG TERM GOAL #2   Title reduce FOTO to < or = to 40% limitation   Time 8   Period Weeks   Status On-going   PT LONG TERM GOAL #3   Title report a 50% reduction in lumbar and thoracic pain with driving   Time 8   Period Weeks   Status On-going   PT LONG TERM GOAL #4   Title report < or = to 4/10 thoracic pain and lumbar pain after standing for housework   Time 8   Period Weeks   Status On-going               Plan - 10/21/14 1322    Clinical Impression Statement Pt will continue to benefit from skilled Pt to adress endurance and weakness to adress low back   Pt will benefit from skilled therapeutic intervention in order to improve on the following deficits Improper body mechanics;Decreased endurance;Postural dysfunction;Pain;Decreased activity tolerance   Rehab Potential Good   PT Frequency 2x / week   PT Duration 8 weeks   PT Treatment/Interventions ADLs/Self Care Home Management;Moist Heat;Therapeutic activities;Therapeutic exercise;Ultrasound;Manual techniques;Neuromuscular re-education;Electrical Stimulation   PT Next Visit Plan increase activities when Left elbow feels better after zyst removal on 10/21/14   PT Home Exercise Plan continue with established HEP   Consulted and Agree with Plan of Care Patient        Problem List Patient Active Problem List   Diagnosis Date Noted  . Essential hypertension 04/02/2013  . Hyperlipidemia   . History of Morbid obesity - status post gastric bypass surgery   . Other abnormal glucose  10/25/2011  . Other malaise and fatigue 10/25/2011  . Myalgia and myositis,  unspecified 10/25/2011  . Degeneration of lumbar or lumbosacral intervertebral disc 10/25/2011  . Degeneration of cervical intervertebral disc 10/25/2011  . Unspecified essential hypertension 10/25/2011  . OSA on CPAP 10/25/2011  . GERD (gastroesophageal reflux disease) 10/25/2011  . IBS (irritable bowel syndrome) 10/25/2011  . COPD (chronic obstructive pulmonary disease) 10/25/2011  . Other affections of shoulder region, not elsewhere classified 06/26/2011  . Post herpetic neuralgia 09/16/2003    NAUMANN-HOUEGNIFIO,Zacarias Krauter PTA 10/21/2014, 1:25 PM  [facility] [address] [place], [place], [address] Phone: [phone]   Fax:  [phone]

## 2014-10-25 ENCOUNTER — Ambulatory Visit (HOSPITAL_COMMUNITY)
Admission: RE | Admit: 2014-10-25 | Discharge: 2014-10-25 | Disposition: A | Discharge status: Home / Self Care | Payer: BLUE CROSS/BLUE SHIELD | Source: Ambulatory Visit | Attending: Urology | Admitting: Urology

## 2014-10-25 DIAGNOSIS — N1330 Unspecified hydronephrosis: Secondary | ICD-10-CM | POA: Insufficient documentation

## 2014-10-25 MED ORDER — TECHNETIUM TC 99M MERTIATIDE
16.1000 | Freq: Once | INTRAVENOUS | Status: AC | PRN
  Administered 2014-10-25: 16.1 via INTRAVENOUS

## 2014-10-25 MED ORDER — FUROSEMIDE 10 MG/ML IJ SOLN
31.0000 mg | Freq: Once | INTRAMUSCULAR | Status: AC
  Administered 2014-10-25: 31 mg via INTRAVENOUS
  Filled 2014-10-25: qty 4

## 2014-10-26 ENCOUNTER — Encounter: Payer: Self-pay | Admitting: Physical Therapy

## 2014-10-26 ENCOUNTER — Ambulatory Visit: Payer: BLUE CROSS/BLUE SHIELD | Admitting: Physical Therapy

## 2014-10-26 DIAGNOSIS — M545 Low back pain, unspecified: Secondary | ICD-10-CM

## 2014-10-26 DIAGNOSIS — R6889 Other general symptoms and signs: Secondary | ICD-10-CM

## 2014-10-26 DIAGNOSIS — M546 Pain in thoracic spine: Secondary | ICD-10-CM

## 2014-10-26 NOTE — Therapy (Signed)
Michaela Raymond [address], [place], [place], [address] Phone: [phone]   Fax:  [phone]  Physical Therapy Treatment  Patient Details  Name: Michaela Raymond MRN: [id number] Date of Birth: May 12, 1956 Referring Provider:  Nena Polio, NP  Encounter Date: 10/26/2014    Past Medical History  Diagnosis Date  . Dysrhythmia   . Angina November 2011    Normal coronary angiogram; noncardiac symptom  . Heart murmur   . Asthma   . Hypertension   . COPD (chronic obstructive pulmonary disease)   . Bronchitis   . Pneumonia   . Sleep apnea     cpap machine x 9 years  . Hypothyroidism   . Adenoma     of thyroid gland, benign  . Chronic kidney disease     cyst on right kidney hx of  . Barrett's esophagus   . GERD (gastroesophageal reflux disease)   . Headache(784.0)   . Fibromyalgia   . Arthritis     osteoarthrits  . Depression   . Anxiety   . Diabetes mellitus   . Hyperlipidemia   . Morbid obesity     Status post gastric bypass surgery September 2013  . Osteoporosis   . Osteopenia     Past Surgical History  Procedure Laterality Date  . Knee arthroplasty      bilaterally  . Back surgery      spinal fusion  . Thyroidectomy    . Rotator cuff repair      2001  . Joint replacement    . Tonsillectomy      and adnoids removed  . Dilation and curettage of uterus    . Cardiac catheterization  05/03/2010    No significant CAD in L coronary system, normal LV function  . Nasal sinus surgery      1989 and mucous cele removed  . Esophagogastroduodenoscopy    . Colonoscopy    . Cardiovascular stress test  04/15/2007    EKG negative for ischemia, no significant ischemia demonstrated.  . Transthoracic echocardiogram  05/03/2021    EF 55-65%, normal  . Laparoscopic roux-en-y gastric bypass with upper endoscopy and removal of lap band      There were no vitals filed for this visit.  Visit Diagnosis:   Bilateral thoracic back pain  Activity intolerance  Bilateral low back pain without sciatica      Subjective Assessment - 10/26/14 1031    Subjective Patient had Renogram done yesterday, to check out Rt kidney, today hurting all over   Pertinent History Lumbar fusion: L4-S1 in 2010   Currently in Pain? Yes   Pain Score 7    Pain Location Back   Pain Orientation Right;Left;Lower   Pain Descriptors / Indicators Aching;Discomfort   Pain Type Chronic pain   Pain Onset More than a month ago   Pain Frequency Constant   Multiple Pain Sites No                         [facility] Adult PT Treatment/Exercise - 10/26/14 0001    Lumbar Exercises: Stretches   Single Knee to Chest Stretch 3 reps;20 seconds   Double Knee to Chest Stretch 3 reps;20 seconds   Lower Trunk Rotation 3 reps;20 seconds   Lumbar Exercises: Aerobic   Stationary Bike Level 2 x 60min   Lumbar Exercises: Standing   Other Standing Lumbar Exercises McKenzie lat shift to Lt 2 x10   Lumbar  Exercises: Supine   Other Supine Lumbar Exercises TA activation with unilateral ER, marching and leg slides    Modalities   Modalities Electrical Stimulation   Moist Heat Therapy   Number Minutes Moist Heat 15 Minutes   Moist Heat Location Other (comment)   Electrical Stimulation   Electrical Stimulation Location lumbar   Electrical Stimulation Action IFC   Electrical Stimulation Parameters 15   Electrical Stimulation Goals Pain                  PT Short Term Goals - 10/19/14 1514    PT SHORT TERM GOAL #1   Title be independent in initial HEP   Time 4   Period Weeks   Status Achieved   PT SHORT TERM GOAL #2   Title report a 25% reduction in thoracic and lumbar pain with driving   Time 4   Period Weeks   Status Achieved   PT SHORT TERM GOAL #3   Title stand for 20 minutes without need to rest   Time 4   Period Weeks   Status Achieved           PT Long Term Goals - 10/26/14 1041    PT LONG  TERM GOAL #1   Title be independent in final HEP   Time 8   Period Weeks   Status On-going   PT LONG TERM GOAL #2   Title reduce FOTO to < or = to 40% limitation   Time 8   Period Weeks   Status On-going   PT LONG TERM GOAL #3   Title report a 50% reduction in lumbar and thoracic pain with driving  85% improvement noted since start of PT   Time 8   Status Achieved   PT LONG TERM GOAL #4   Title report < or = to 4/10 thoracic pain and lumbar pain after standing for housework   Time 8   Period Weeks   Status On-going               Problem List Patient Active Problem List   Diagnosis Date Noted  . Essential hypertension 04/02/2013  . Hyperlipidemia   . History of Morbid obesity - status post gastric bypass surgery   . Other abnormal glucose 10/25/2011  . Other malaise and fatigue 10/25/2011  . Myalgia and myositis, unspecified 10/25/2011  . Degeneration of lumbar or lumbosacral intervertebral disc 10/25/2011  . Degeneration of cervical intervertebral disc 10/25/2011  . Unspecified essential hypertension 10/25/2011  . OSA on CPAP 10/25/2011  . GERD (gastroesophageal reflux disease) 10/25/2011  . IBS (irritable bowel syndrome) 10/25/2011  . COPD (chronic obstructive pulmonary disease) 10/25/2011  . Other affections of shoulder region, not elsewhere classified 06/26/2011  . Post herpetic neuralgia 09/16/2003    Raymond,Michaela Gillison 10/26/2014, 11:05 AM   [facility] [address] [place], [place], [address] Phone: [phone]   Fax:  [phone]

## 2014-10-28 ENCOUNTER — Ambulatory Visit: Payer: BLUE CROSS/BLUE SHIELD | Admitting: Physical Therapy

## 2014-10-28 ENCOUNTER — Encounter: Payer: Self-pay | Admitting: Physical Therapy

## 2014-10-28 DIAGNOSIS — M545 Low back pain, unspecified: Secondary | ICD-10-CM

## 2014-10-28 DIAGNOSIS — M546 Pain in thoracic spine: Secondary | ICD-10-CM

## 2014-10-28 DIAGNOSIS — R6889 Other general symptoms and signs: Secondary | ICD-10-CM

## 2014-10-28 NOTE — Therapy (Signed)
[facility] [address] [place], [place], [address] Phone: [phone]   Fax:  [phone]  Physical Therapy Treatment  Patient Details  Name: Michaela Raymond MRN: [id number] Date of Birth: 1955/12/26 Referring Provider:  Nena Polio, NP  Encounter Date: 10/28/2014      PT End of Session - 10/28/14 1436    Visit Number 11   Date for PT Re-Evaluation 11/10/14   PT Start Time 1359   PT Stop Time 1500   PT Time Calculation (min) 61 min   Activity Tolerance Patient tolerated treatment well   Behavior During Therapy [facility] for tasks assessed/performed      Past Medical History  Diagnosis Date  . Dysrhythmia   . Angina November 2011    Normal coronary angiogram; noncardiac symptom  . Heart murmur   . Asthma   . Hypertension   . COPD (chronic obstructive pulmonary disease)   . Bronchitis   . Pneumonia   . Sleep apnea     cpap machine x 9 years  . Hypothyroidism   . Adenoma     of thyroid gland, benign  . Chronic kidney disease     cyst on right kidney hx of  . Barrett's esophagus   . GERD (gastroesophageal reflux disease)   . Headache(784.0)   . Fibromyalgia   . Arthritis     osteoarthrits  . Depression   . Anxiety   . Diabetes mellitus   . Hyperlipidemia   . Morbid obesity     Status post gastric bypass surgery September 2013  . Osteoporosis   . Osteopenia     Past Surgical History  Procedure Laterality Date  . Knee arthroplasty      bilaterally  . Back surgery      spinal fusion  . Thyroidectomy    . Rotator cuff repair      2001  . Joint replacement    . Tonsillectomy      and adnoids removed  . Dilation and curettage of uterus    . Cardiac catheterization  05/03/2010    No significant CAD in L coronary system, normal LV function  . Nasal sinus surgery      1989 and mucous cele removed  . Esophagogastroduodenoscopy    . Colonoscopy    . Cardiovascular stress test  04/15/2007     EKG negative for ischemia, no significant ischemia demonstrated.  . Transthoracic echocardiogram  05/03/2021    EF 55-65%, normal  . Laparoscopic roux-en-y gastric bypass with upper endoscopy and removal of lap band      There were no vitals filed for this visit.  Visit Diagnosis:  Bilateral thoracic back pain  Activity intolerance  Bilateral low back pain without sciatica      Subjective Assessment - 10/28/14 1417    Subjective Pt needs to have Bone density test done per MD Volanda Napoleon, pt needs to perform activities to help with bone density   Pertinent History Lumbar fusion: L4-S1 in 2010   Limitations Standing;Walking   Currently in Pain? Yes   Pain Score 7    Pain Location Back   Pain Orientation Right;Left;Lower   Pain Descriptors / Indicators Aching;Discomfort   Pain Type Chronic pain   Pain Onset More than a month ago                         [facility] Adult PT Treatment/Exercise - 10/28/14 0001    Lumbar  Exercises: Stretches   Single Knee to Chest Stretch Other (comment)  verbally addressed to continue with stretches at home   Lumbar Exercises: Aerobic   Stationary Bike Level 2 x 30min   Elliptical incline/resistance level3 x 4 min   UBE (Upper Arm Bike) L 1 x 52min backwards, sitting on green ball   Lumbar Exercises: Machines for Strengthening   Leg Press St5, 65# 3x10 B LE   Other Lumbar Machine Exercise Pectoralis push 20# horizontall/vertical grip x 10 each   Other Lumbar Machine Exercise weighted pulleys walking back 30#   Lumbar Exercises: Standing   Other Standing Lumbar Exercises McKenzie lat shift to Lt 2 x10   Modalities   Modalities Electrical Stimulation   Moist Heat Therapy   Number Minutes Moist Heat 15 Minutes   Moist Heat Location Other (comment)  back   Electrical Stimulation   Electrical Stimulation Location lumbar   Electrical Stimulation Action IFC   Electrical Stimulation Parameters 15   Electrical Stimulation Goals Pain                   PT Short Term Goals - 10/19/14 1514    PT SHORT TERM GOAL #1   Title be independent in initial HEP   Time 4   Period Weeks   Status Achieved   PT SHORT TERM GOAL #2   Title report a 25% reduction in thoracic and lumbar pain with driving   Time 4   Period Weeks   Status Achieved   PT SHORT TERM GOAL #3   Title stand for 20 minutes without need to rest   Time 4   Period Weeks   Status Achieved           PT Long Term Goals - 10/26/14 1041    PT LONG TERM GOAL #1   Title be independent in final HEP   Time 8   Period Weeks   Status On-going   PT LONG TERM GOAL #2   Title reduce FOTO to < or = to 40% limitation   Time 8   Period Weeks   Status On-going   PT LONG TERM GOAL #3   Title report a 50% reduction in lumbar and thoracic pain with driving  39% improvement noted since start of PT   Time 8   Status Achieved   PT LONG TERM GOAL #4   Title report < or = to 4/10 thoracic pain and lumbar pain after standing for housework   Time 8   Period Weeks   Status On-going               Plan - 10/28/14 1436    Clinical Impression Statement Pt will continue to benefit from PT to adress strength, endurance and education how to advance gym program to help with bonedensity   Pt will benefit from skilled therapeutic intervention in order to improve on the following deficits Improper body mechanics;Decreased endurance;Postural dysfunction;Pain;Decreased activity tolerance   Rehab Potential Good   PT Frequency 2x / week   PT Duration 8 weeks   PT Treatment/Interventions ADLs/Self Care Home Management;Moist Heat;Therapeutic activities;Therapeutic exercise;Ultrasound;Manual techniques;Neuromuscular re-education;Electrical Stimulation   PT Next Visit Plan continue to establish Gym program   PT Home Exercise Plan continue with established HEP   Consulted and Agree with Plan of Care Patient        Problem List Patient Active Problem List    Diagnosis Date Noted  . Essential hypertension 04/02/2013  . Hyperlipidemia   .  History of Morbid obesity - status post gastric bypass surgery   . Other abnormal glucose 10/25/2011  . Other malaise and fatigue 10/25/2011  . Myalgia and myositis, unspecified 10/25/2011  . Degeneration of lumbar or lumbosacral intervertebral disc 10/25/2011  . Degeneration of cervical intervertebral disc 10/25/2011  . Unspecified essential hypertension 10/25/2011  . OSA on CPAP 10/25/2011  . GERD (gastroesophageal reflux disease) 10/25/2011  . IBS (irritable bowel syndrome) 10/25/2011  . COPD (chronic obstructive pulmonary disease) 10/25/2011  . Other affections of shoulder region, not elsewhere classified 06/26/2011  . Post herpetic neuralgia 09/16/2003    NAUMANN-HOUEGNIFIO,Imaad Reuss PTA 10/28/2014, 2:48 PM  [facility] [address] [place], [place], [address] Phone: [phone]   Fax:  [phone]

## 2014-11-02 ENCOUNTER — Encounter: Payer: Self-pay | Admitting: Physical Therapy

## 2014-11-02 ENCOUNTER — Ambulatory Visit: Payer: BLUE CROSS/BLUE SHIELD | Admitting: Physical Therapy

## 2014-11-02 DIAGNOSIS — M545 Low back pain, unspecified: Secondary | ICD-10-CM

## 2014-11-02 DIAGNOSIS — R6889 Other general symptoms and signs: Secondary | ICD-10-CM

## 2014-11-02 DIAGNOSIS — M546 Pain in thoracic spine: Secondary | ICD-10-CM

## 2014-11-02 NOTE — Therapy (Signed)
[facility] [address] [place], [place], [address] Phone: [phone]   Fax:  [phone]  Physical Therapy Treatment  Patient Details  Name: Michaela Raymond MRN: [id number] Date of Birth: July 16, 1955 Referring Provider:  Magnus Sinning, MD  Encounter Date: 11/02/2014      PT End of Session - 11/02/14 1441    Visit Number 12   Date for PT Re-Evaluation 11/10/14   PT Start Time 1400   PT Stop Time 1510   PT Time Calculation (min) 70 min   Activity Tolerance Patient tolerated treatment well   Behavior During Therapy [facility] for tasks assessed/performed      Past Medical History  Diagnosis Date  . Dysrhythmia   . Angina November 2011    Normal coronary angiogram; noncardiac symptom  . Heart murmur   . Asthma   . Hypertension   . COPD (chronic obstructive pulmonary disease)   . Bronchitis   . Pneumonia   . Sleep apnea     cpap machine x 9 years  . Hypothyroidism   . Adenoma     of thyroid gland, benign  . Chronic kidney disease     cyst on right kidney hx of  . Barrett's esophagus   . GERD (gastroesophageal reflux disease)   . Headache(784.0)   . Fibromyalgia   . Arthritis     osteoarthrits  . Depression   . Anxiety   . Diabetes mellitus   . Hyperlipidemia   . Morbid obesity     Status post gastric bypass surgery September 2013  . Osteoporosis   . Osteopenia     Past Surgical History  Procedure Laterality Date  . Knee arthroplasty      bilaterally  . Back surgery      spinal fusion  . Thyroidectomy    . Rotator cuff repair      2001  . Joint replacement    . Tonsillectomy      and adnoids removed  . Dilation and curettage of uterus    . Cardiac catheterization  05/03/2010    No significant CAD in L coronary system, normal LV function  . Nasal sinus surgery      1989 and mucous cele removed  . Esophagogastroduodenoscopy    . Colonoscopy    . Cardiovascular stress test  04/15/2007   EKG negative for ischemia, no significant ischemia demonstrated.  . Transthoracic echocardiogram  05/03/2021    EF 55-65%, normal  . Laparoscopic roux-en-y gastric bypass with upper endoscopy and removal of lap band      There were no vitals filed for this visit.  Visit Diagnosis:  Bilateral thoracic back pain  Activity intolerance  Bilateral low back pain without sciatica      Subjective Assessment - 11/02/14 1409    Subjective I am not having alot of pain today. I have my lidoderm patches and they are helping. I mowed my lawn Saturday and did well.    Pertinent History Lumbar fusion: L4-S1 in 2010   Limitations Standing;Walking   How long can you stand comfortably? 10   How long can you walk comfortably? 30 minutes max.   Diagnostic tests  MRI shows more narrowing compare to years ago, and enlargement right renal - has appointment to address this    Patient Stated Goals Stand longer, less pain   Currently in Pain? Yes   Pain Score 3    Pain Location Back   Pain Orientation Right;Left;Lower  Pain Descriptors / Indicators Dull   Pain Type Chronic pain   Pain Onset More than a month ago   Pain Frequency Constant   Aggravating Factors  lifting things heavy, too much exercise, bending over   Pain Relieving Factors heat, massage   Multiple Pain Sites No                         [facility] Adult PT Treatment/Exercise - 11/02/14 0001    Lumbar Exercises: Aerobic   Stationary Bike Level 2 x 68min   Elliptical incline/resistance level3 x 4 min   UBE (Upper Arm Bike) L 1 x 67min backwards, sitting on green ball   Lumbar Exercises: Machines for Strengthening   Leg Press St5, 65# 3x10 B LE   Other Lumbar Machine Exercise Pectoralis push 20# horizontall/vertical grip x 10 each   Other Lumbar Machine Exercise weighted pulleys walking back 30#                  PT Short Term Goals - 10/19/14 1514    PT SHORT TERM GOAL #1   Title be independent in initial HEP    Time 4   Period Weeks   Status Achieved   PT SHORT TERM GOAL #2   Title report a 25% reduction in thoracic and lumbar pain with driving   Time 4   Period Weeks   Status Achieved   PT SHORT TERM GOAL #3   Title stand for 20 minutes without need to rest   Time 4   Period Weeks   Status Achieved           PT Long Term Goals - 11/02/14 1441    PT LONG TERM GOAL #1   Title be independent in final HEP   Time 8   Period Weeks   Status On-going  still learning exercises   PT LONG TERM GOAL #2   Title reduce FOTO to < or = to 40% limitation   Time 8   Period Weeks   Status On-going   PT LONG TERM GOAL #3   Title report a 50% reduction in lumbar and thoracic pain with driving   Period Weeks   Status Achieved  50%   PT LONG TERM GOAL #4   Title report < or = to 4/10 thoracic pain and lumbar pain after standing for housework   Time 8   Period Weeks   Status Achieved  50% less, 4-5/10               Plan - 11/02/14 1444    Clinical Impression Statement Patient is building up her endurance for aerobic activities.  Patient has not driven in a car for long period of time yet.  Patient continues to have a lateral shift.  Patient is still learning ercises with HEP. Patient is working on her core strength.    Pt will benefit from skilled therapeutic intervention in order to improve on the following deficits Improper body mechanics;Decreased endurance;Postural dysfunction;Pain;Decreased activity tolerance   Rehab Potential Good   PT Frequency 2x / week   PT Duration 8 weeks   PT Treatment/Interventions ADLs/Self Care Home Management;Moist Heat;Therapeutic activities;Therapeutic exercise;Ultrasound;Manual techniques;Neuromuscular re-education;Electrical Stimulation   PT Next Visit Plan continue to establish Gym program   PT Home Exercise Plan continue with established HEP   Consulted and Agree with Plan of Care Patient        Problem List Patient Active Problem List  Diagnosis Date Noted  . Essential hypertension 04/02/2013  . Hyperlipidemia   . History of Morbid obesity - status post gastric bypass surgery   . Other abnormal glucose 10/25/2011  . Other malaise and fatigue 10/25/2011  . Myalgia and myositis, unspecified 10/25/2011  . Degeneration of lumbar or lumbosacral intervertebral disc 10/25/2011  . Degeneration of cervical intervertebral disc 10/25/2011  . Unspecified essential hypertension 10/25/2011  . OSA on CPAP 10/25/2011  . GERD (gastroesophageal reflux disease) 10/25/2011  . IBS (irritable bowel syndrome) 10/25/2011  . COPD (chronic obstructive pulmonary disease) 10/25/2011  . Other affections of shoulder region, not elsewhere classified 06/26/2011  . Post herpetic neuralgia 09/16/2003    GRAY,CHERYL,PT 11/02/2014, 2:53 PM  [facility] [address] [place], [place], [address] Phone: [phone]   Fax:  [phone]

## 2014-11-04 ENCOUNTER — Encounter: Payer: Self-pay | Admitting: Physical Therapy

## 2014-11-04 ENCOUNTER — Ambulatory Visit: Payer: BLUE CROSS/BLUE SHIELD | Admitting: Physical Therapy

## 2014-11-04 DIAGNOSIS — M546 Pain in thoracic spine: Secondary | ICD-10-CM

## 2014-11-04 DIAGNOSIS — M545 Low back pain, unspecified: Secondary | ICD-10-CM

## 2014-11-04 DIAGNOSIS — R6889 Other general symptoms and signs: Secondary | ICD-10-CM

## 2014-11-04 NOTE — Therapy (Signed)
[facility] [address] [place], [place], [address] Phone: [phone]   Fax:  [phone]  Physical Therapy Treatment  Patient Details  Name: Michaela Raymond MRN: [id number] Date of Birth: Dec 11, 1955 Referring Provider:  Nena Polio, NP  Encounter Date: 11/04/2014      PT End of Session - 11/04/14 0926    Visit Number 14   Date for PT Re-Evaluation 11/10/14   PT Start Time 0847   PT Stop Time 0933   PT Time Calculation (min) 46 min   Activity Tolerance Patient tolerated treatment well   Behavior During Therapy [facility] for tasks assessed/performed      Past Medical History  Diagnosis Date  . Dysrhythmia   . Angina November 2011    Normal coronary angiogram; noncardiac symptom  . Heart murmur   . Asthma   . Hypertension   . COPD (chronic obstructive pulmonary disease)   . Bronchitis   . Pneumonia   . Sleep apnea     cpap machine x 9 years  . Hypothyroidism   . Adenoma     of thyroid gland, benign  . Chronic kidney disease     cyst on right kidney hx of  . Barrett's esophagus   . GERD (gastroesophageal reflux disease)   . Headache(784.0)   . Fibromyalgia   . Arthritis     osteoarthrits  . Depression   . Anxiety   . Diabetes mellitus   . Hyperlipidemia   . Morbid obesity     Status post gastric bypass surgery September 2013  . Osteoporosis   . Osteopenia     Past Surgical History  Procedure Laterality Date  . Knee arthroplasty      bilaterally  . Back surgery      spinal fusion  . Thyroidectomy    . Rotator cuff repair      2001  . Joint replacement    . Tonsillectomy      and adnoids removed  . Dilation and curettage of uterus    . Cardiac catheterization  05/03/2010    No significant CAD in L coronary system, normal LV function  . Nasal sinus surgery      1989 and mucous cele removed  . Esophagogastroduodenoscopy    . Colonoscopy    . Cardiovascular stress test  04/15/2007   EKG negative for ischemia, no significant ischemia demonstrated.  . Transthoracic echocardiogram  05/03/2021    EF 55-65%, normal  . Laparoscopic roux-en-y gastric bypass with upper endoscopy and removal of lap band      There were no vitals filed for this visit.  Visit Diagnosis:  Bilateral thoracic back pain  Activity intolerance  Bilateral low back pain without sciatica      Subjective Assessment - 11/04/14 0851    Subjective Pt has a good day today, no complains of back pain, but still limited in standing and walking tolerance   Pertinent History Lumbar fusion: L4-S1 in 2010   Limitations Standing;Walking   Currently in Pain? No/denies   Multiple Pain Sites No            [facility] PT Assessment - 11/04/14 0001    Observation/Other Assessments   Focus on Therapeutic Outcomes (FOTO)  52%limitation  results different then subjective wellbeing                     [facility] Adult PT Treatment/Exercise - 11/04/14 0001    Lumbar Exercises: Aerobic  Stationary Bike Level 2 x 57min   Elliptical incline/resistance level3 x 5 min   UBE (Upper Arm Bike) L 1 x 39min backwards, sitting on green ball   Lumbar Exercises: Machines for Strengthening   Cybex Knee Flexion Lattissimus pull down 20#   Leg Press St5, 65# 3x10 B LE   Other Lumbar Machine Exercise Pectoralis push 20# horizontal/vertical grip 2x10 each   Modalities   Modalities --  Weaning away from modalities                  PT Short Term Goals - 11/04/14 0902    PT SHORT TERM GOAL #1   Title be independent in initial HEP   Time 4   Period Weeks   PT SHORT TERM GOAL #2   Title report a 25% reduction in thoracic and lumbar pain with driving   Time 4   Period Weeks   Status Achieved   PT SHORT TERM GOAL #3   Title stand for 20 minutes without need to rest   Time 4   Period Weeks   Status Achieved           PT Long Term Goals - 11/04/14 1610    PT LONG TERM GOAL #1   Title be independent  in final HEP   Time 8   Period Weeks   Status On-going   PT LONG TERM GOAL #2   Title reduce FOTO to < or = to 40% limitation   Time 8   Period Weeks   PT LONG TERM GOAL #3   Title report a 50% reduction in lumbar and thoracic pain with driving   Time 8   Period Weeks   Status Achieved   PT LONG TERM GOAL #4   Title report < or = to 4/10 thoracic pain and lumbar pain after standing for housework   Time 8   Period Weeks   Status Achieved               Plan - 11/04/14 0859    Clinical Impression Statement Patient continues to improve with her stamina and strength.and postural awarness   Pt will benefit from skilled therapeutic intervention in order to improve on the following deficits Improper body mechanics;Decreased endurance;Postural dysfunction;Pain;Decreased activity tolerance   Rehab Potential Good   PT Frequency 2x / week   PT Duration 8 weeks   PT Treatment/Interventions ADLs/Self Care Home Management;Moist Heat;Therapeutic activities;Therapeutic exercise;Ultrasound;Manual techniques;Neuromuscular re-education;Electrical Stimulation   PT Next Visit Plan D/C next week   PT Home Exercise Plan cpontinue to establish HEP   Consulted and Agree with Plan of Care Patient        Problem List Patient Active Problem List   Diagnosis Date Noted  . Essential hypertension 04/02/2013  . Hyperlipidemia   . History of Morbid obesity - status post gastric bypass surgery   . Other abnormal glucose 10/25/2011  . Other malaise and fatigue 10/25/2011  . Myalgia and myositis, unspecified 10/25/2011  . Degeneration of lumbar or lumbosacral intervertebral disc 10/25/2011  . Degeneration of cervical intervertebral disc 10/25/2011  . Unspecified essential hypertension 10/25/2011  . OSA on CPAP 10/25/2011  . GERD (gastroesophageal reflux disease) 10/25/2011  . IBS (irritable bowel syndrome) 10/25/2011  . COPD (chronic obstructive pulmonary disease) 10/25/2011  . Other affections  of shoulder region, not elsewhere classified 06/26/2011  . Post herpetic neuralgia 09/16/2003    NAUMANN-HOUEGNIFIO,Danni Leabo PTA 11/04/2014, 2:30 PM  [facility] [address]  [address], [place], [place], [address] Phone: [phone]   Fax:  [phone]

## 2014-11-08 ENCOUNTER — Encounter: Payer: Self-pay | Admitting: Physical Therapy

## 2014-11-08 ENCOUNTER — Ambulatory Visit: Payer: BLUE CROSS/BLUE SHIELD | Admitting: Physical Therapy

## 2014-11-08 DIAGNOSIS — M545 Low back pain, unspecified: Secondary | ICD-10-CM

## 2014-11-08 DIAGNOSIS — R6889 Other general symptoms and signs: Secondary | ICD-10-CM

## 2014-11-08 DIAGNOSIS — M546 Pain in thoracic spine: Secondary | ICD-10-CM

## 2014-11-08 NOTE — Therapy (Signed)
[facility] [address], [place], [place], [address] Phone: [phone]   Fax:  [phone]  Physical Therapy Treatment  Patient Details  Name: Michaela Raymond MRN: [id number] Date of Birth: 14-Nov-1955 Referring Provider:  Nena Polio, NP  Encounter Date: 11/08/2014      PT End of Session - 11/08/14 1522    Visit Number 15   Date for PT Re-Evaluation 11/10/14   PT Start Time 1457   PT Stop Time 1530   PT Time Calculation (min) 33 min   Activity Tolerance Patient tolerated treatment well   Behavior During Therapy [facility] for tasks assessed/performed      Past Medical History  Diagnosis Date  . Dysrhythmia   . Angina November 2011    Normal coronary angiogram; noncardiac symptom  . Heart murmur   . Asthma   . Hypertension   . COPD (chronic obstructive pulmonary disease)   . Bronchitis   . Pneumonia   . Sleep apnea     cpap machine x 9 years  . Hypothyroidism   . Adenoma     of thyroid gland, benign  . Chronic kidney disease     cyst on right kidney hx of  . Barrett's esophagus   . GERD (gastroesophageal reflux disease)   . Headache(784.0)   . Fibromyalgia   . Arthritis     osteoarthrits  . Depression   . Anxiety   . Diabetes mellitus   . Hyperlipidemia   . Morbid obesity     Status post gastric bypass surgery September 2013  . Osteoporosis   . Osteopenia     Past Surgical History  Procedure Laterality Date  . Knee arthroplasty      bilaterally  . Back surgery      spinal fusion  . Thyroidectomy    . Rotator cuff repair      2001  . Joint replacement    . Tonsillectomy      and adnoids removed  . Dilation and curettage of uterus    . Cardiac catheterization  05/03/2010    No significant CAD in L coronary system, normal LV function  . Nasal sinus surgery      1989 and mucous cele removed  . Esophagogastroduodenoscopy    . Colonoscopy    . Cardiovascular stress test  04/15/2007   EKG negative for ischemia, no significant ischemia demonstrated.  . Transthoracic echocardiogram  05/03/2021    EF 55-65%, normal  . Laparoscopic roux-en-y gastric bypass with upper endoscopy and removal of lap band      There were no vitals filed for this visit.  Visit Diagnosis:  Bilateral thoracic back pain  Activity intolerance  Bilateral low back pain without sciatica      Subjective Assessment - 11/08/14 1509    Subjective Pt is not sure if she is ready to leave PT, she makes good and safe progress   Pertinent History Lumbar fusion: L4-S1 in 2010   Limitations Standing;Walking   Currently in Pain? Yes   Pain Score 4    Pain Location Back   Pain Orientation Right;Left;Lower   Pain Descriptors / Indicators Dull   Pain Type Chronic pain   Pain Onset More than a month ago   Pain Frequency Constant   Multiple Pain Sites No                         [facility] Adult PT Treatment/Exercise - 11/08/14  0001    Lumbar Exercises: Aerobic   Stationary Bike Level 2 x 33min   Elliptical incline/resistance level3 x 5 min   UBE (Upper Arm Bike) L 1 x 63min backwards, sitting on green ball   Lumbar Exercises: Machines for Strengthening   Cybex Knee Flexion Lattissimus pull down 20# 2 x10 with abdominal bracing   Leg Press St5, 70# 3x10 B LE   Other Lumbar Machine Exercise Pectoralis push 20# horizontal/vertical grip 2x10 each   Other Lumbar Machine Exercise --                  PT Short Term Goals - 11/04/14 0902    PT SHORT TERM GOAL #1   Title be independent in initial HEP   Time 4   Period Weeks   PT SHORT TERM GOAL #2   Title report a 25% reduction in thoracic and lumbar pain with driving   Time 4   Period Weeks   Status Achieved   PT SHORT TERM GOAL #3   Title stand for 20 minutes without need to rest   Time 4   Period Weeks   Status Achieved           PT Long Term Goals - 11/04/14 5364    PT LONG TERM GOAL #1   Title be independent in  final HEP   Time 8   Period Weeks   Status On-going   PT LONG TERM GOAL #2   Title reduce FOTO to < or = to 40% limitation   Time 8   Period Weeks   PT LONG TERM GOAL #3   Title report a 50% reduction in lumbar and thoracic pain with driving   Time 8   Period Weeks   Status Achieved   PT LONG TERM GOAL #4   Title report < or = to 4/10 thoracic pain and lumbar pain after standing for housework   Time 8   Period Weeks   Status Achieved               Plan - 11/08/14 1525    Clinical Impression Statement Progress toward D/C next visit   Pt will benefit from skilled therapeutic intervention in order to improve on the following deficits Improper body mechanics;Decreased endurance;Postural dysfunction;Pain;Decreased activity tolerance   Rehab Potential Good   PT Frequency 2x / week   PT Duration 8 weeks   PT Treatment/Interventions ADLs/Self Care Home Management;Moist Heat;Therapeutic activities;Therapeutic exercise;Ultrasound;Manual techniques;Neuromuscular re-education;Electrical Stimulation   PT Next Visit Plan D/C next visit   PT Home Exercise Plan cpontinue to establish HEP   Consulted and Agree with Plan of Care Patient        Problem List Patient Active Problem List   Diagnosis Date Noted  . Essential hypertension 04/02/2013  . Hyperlipidemia   . History of Morbid obesity - status post gastric bypass surgery   . Other abnormal glucose 10/25/2011  . Other malaise and fatigue 10/25/2011  . Myalgia and myositis, unspecified 10/25/2011  . Degeneration of lumbar or lumbosacral intervertebral disc 10/25/2011  . Degeneration of cervical intervertebral disc 10/25/2011  . Unspecified essential hypertension 10/25/2011  . OSA on CPAP 10/25/2011  . GERD (gastroesophageal reflux disease) 10/25/2011  . IBS (irritable bowel syndrome) 10/25/2011  . COPD (chronic obstructive pulmonary disease) 10/25/2011  . Other affections of shoulder region, not elsewhere classified  06/26/2011  . Post herpetic neuralgia 09/16/2003    NAUMANN-HOUEGNIFIO,Antwane Grose PTA 11/08/2014, 3:31 PM  [facility]  [place] [address], [place], [place], [address] Phone: [phone]   Fax:  [phone]

## 2014-11-09 ENCOUNTER — Ambulatory Visit: Payer: BLUE CROSS/BLUE SHIELD

## 2014-11-09 DIAGNOSIS — M545 Low back pain, unspecified: Secondary | ICD-10-CM

## 2014-11-09 DIAGNOSIS — R6889 Other general symptoms and signs: Secondary | ICD-10-CM

## 2014-11-09 DIAGNOSIS — M546 Pain in thoracic spine: Secondary | ICD-10-CM

## 2014-11-09 NOTE — Therapy (Signed)
[facility] [address] [place], [place], [address] Phone: [phone]   Fax:  [phone]  Physical Therapy Treatment  Patient Details  Name: Michaela Raymond MRN: [id number] Date of Birth: 02-Jan-1956 Referring Provider:  Magnus Sinning, MD  Encounter Date: 11/09/2014      PT End of Session - 11/09/14 1139    Visit Number 16   Date for PT Re-Evaluation 12/07/14   PT Start Time 1105   PT Stop Time 1145   PT Time Calculation (min) 40 min   Activity Tolerance Patient tolerated treatment well   Behavior During Therapy [facility] for tasks assessed/performed      Past Medical History  Diagnosis Date  . Dysrhythmia   . Angina November 2011    Normal coronary angiogram; noncardiac symptom  . Heart murmur   . Asthma   . Hypertension   . COPD (chronic obstructive pulmonary disease)   . Bronchitis   . Pneumonia   . Sleep apnea     cpap machine x 9 years  . Hypothyroidism   . Adenoma     of thyroid gland, benign  . Chronic kidney disease     cyst on right kidney hx of  . Barrett's esophagus   . GERD (gastroesophageal reflux disease)   . Headache(784.0)   . Fibromyalgia   . Arthritis     osteoarthrits  . Depression   . Anxiety   . Diabetes mellitus   . Hyperlipidemia   . Morbid obesity     Status post gastric bypass surgery September 2013  . Osteoporosis   . Osteopenia     Past Surgical History  Procedure Laterality Date  . Knee arthroplasty      bilaterally  . Back surgery      spinal fusion  . Thyroidectomy    . Rotator cuff repair      2001  . Joint replacement    . Tonsillectomy      and adnoids removed  . Dilation and curettage of uterus    . Cardiac catheterization  05/03/2010    No significant CAD in L coronary system, normal LV function  . Nasal sinus surgery      1989 and mucous cele removed  . Esophagogastroduodenoscopy    . Colonoscopy    . Cardiovascular stress test  04/15/2007   EKG negative for ischemia, no significant ischemia demonstrated.  . Transthoracic echocardiogram  05/03/2021    EF 55-65%, normal  . Laparoscopic roux-en-y gastric bypass with upper endoscopy and removal of lap band      There were no vitals filed for this visit.  Visit Diagnosis:  Bilateral thoracic back pain - Plan: PT plan of care cert/re-cert  Activity intolerance - Plan: PT plan of care cert/re-cert  Bilateral low back pain without sciatica - Plan: PT plan of care cert/re-cert      Subjective Assessment - 11/09/14 1119    Subjective Pt would like to continue with PT for a few sessions for safe return to gym exercises.    Currently in Pain? Yes   Pain Score 5    Pain Location Back   Pain Orientation Right;Left;Lower   Pain Descriptors / Indicators Dull   Pain Type Chronic pain   Pain Onset More than a month ago   Pain Frequency Constant   Aggravating Factors  activity, standing long periods, housework   Pain Relieving Factors heat, TENs unit, laying down   Multiple Pain Sites No  [facility] PT Assessment - 11/09/14 0001    Assessment   Medical Diagnosis LBP (M54.5) thoracic pain (M54.6)   Onset Date/Surgical Date 08/25/14   Observation/Other Assessments   Focus on Therapeutic Outcomes (FOTO)  52%limitation                     [facility] Adult PT Treatment/Exercise - 11/09/14 0001    Lumbar Exercises: Aerobic   Stationary Bike Level 2 x 41min   Elliptical incline/resistance level3 x 6 min   UBE (Upper Arm Bike) L 1 x 55min backwards, sitting on green ball   Lumbar Exercises: Machines for Strengthening   Cybex Knee Flexion Lattissimus pull down 20# 2 x10 with abdominal bracing   Leg Press St5, 70# 3x10 B LE   Other Lumbar Machine Exercise Pectoralis push 20# horizontal/vertical grip 2x10 each                  PT Short Term Goals - 11/04/14 0902    PT SHORT TERM GOAL #1   Title be independent in initial HEP   Time 4   Period Weeks   PT  SHORT TERM GOAL #2   Title report a 25% reduction in thoracic and lumbar pain with driving   Time 4   Period Weeks   Status Achieved   PT SHORT TERM GOAL #3   Title stand for 20 minutes without need to rest   Time 4   Period Weeks   Status Achieved           PT Long Term Goals - 11/09/14 1115    PT LONG TERM GOAL #1   Title be independent in final HEP   Time 4   Period Weeks   Status On-going   PT LONG TERM GOAL #2   Title reduce FOTO to < or = to 40% limitation   Time 4   Period Weeks   Status On-going  52% limitation   PT LONG TERM GOAL #3   Title report a 50% reduction in lumbar and thoracic pain with driving   Status Achieved  75% improvement   PT LONG TERM GOAL #4   Title report < or = to 4/10 thoracic pain and lumbar pain after standing for housework   Time 4   Status On-going  up to 7/10 with cooking, 2-3/10 with housework   PT LONG TERM GOAL #5   Title safely progress to gym exercises and verbalize how to progress   Time 4   Period Weeks   Status New               Plan - 11/09/14 1118    Clinical Impression Statement Pt reports 75% overall imrpovement and has not progressed to independence with gym exercises.  Pt will continue with PT for guidance as pt returns to the gym and will benefit from skilled advancement of this program.   Pt will benefit from skilled therapeutic intervention in order to improve on the following deficits Improper body mechanics;Decreased endurance;Postural dysfunction;Pain;Decreased activity tolerance   Rehab Potential Good   PT Frequency 1x / week   PT Duration 4 weeks   PT Treatment/Interventions ADLs/Self Care Home Management;Moist Heat;Therapeutic activities;Therapeutic exercise;Ultrasound;Manual techniques;Neuromuscular re-education;Electrical Stimulation   PT Next Visit Plan Follow pt as she transitions to the gym.     Consulted and Agree with Plan of Care Patient        Problem List Patient Active Problem List    Diagnosis Date Noted  .  Essential hypertension 04/02/2013  . Hyperlipidemia   . History of Morbid obesity - status post gastric bypass surgery   . Other abnormal glucose 10/25/2011  . Other malaise and fatigue 10/25/2011  . Myalgia and myositis, unspecified 10/25/2011  . Degeneration of lumbar or lumbosacral intervertebral disc 10/25/2011  . Degeneration of cervical intervertebral disc 10/25/2011  . Unspecified essential hypertension 10/25/2011  . OSA on CPAP 10/25/2011  . GERD (gastroesophageal reflux disease) 10/25/2011  . IBS (irritable bowel syndrome) 10/25/2011  . COPD (chronic obstructive pulmonary disease) 10/25/2011  . Other affections of shoulder region, not elsewhere classified 06/26/2011  . Post herpetic neuralgia 09/16/2003    TAKACS,KELLY, PT 11/09/2014, 11:41 AM  [facility] [address], [place], [place], [address] Phone: [phone]   Fax:  [phone]

## 2014-11-12 NOTE — Telephone Encounter (Signed)
Left message to return our call.

## 2014-11-19 ENCOUNTER — Ambulatory Visit: Payer: BLUE CROSS/BLUE SHIELD | Attending: Physical Medicine and Rehabilitation | Admitting: Physical Therapy

## 2014-11-19 ENCOUNTER — Encounter: Payer: Self-pay | Admitting: Physical Therapy

## 2014-11-19 DIAGNOSIS — M545 Low back pain, unspecified: Secondary | ICD-10-CM

## 2014-11-19 DIAGNOSIS — M546 Pain in thoracic spine: Secondary | ICD-10-CM | POA: Diagnosis present

## 2014-11-19 DIAGNOSIS — R6889 Other general symptoms and signs: Secondary | ICD-10-CM | POA: Diagnosis present

## 2014-11-19 NOTE — Patient Instructions (Signed)
Patient will use the 1-10 scale to determine how much weight to use at the gym. She was instructed to stay in the 4-5 range.

## 2014-11-19 NOTE — Therapy (Signed)
[facility] [address], [place], [place], [address] Phone: [phone]   Fax:  [phone]  Physical Therapy Treatment  Patient Details  Name: Michaela Raymond MRN: [id number] Date of Birth: 10/06/1955 Referring Provider:  Nena Polio, NP  Encounter Date: 11/19/2014      PT End of Session - 11/19/14 0956    Visit Number 17   Date for PT Re-Evaluation 12/07/14   PT Start Time 0930   PT Stop Time 1015   PT Time Calculation (min) 45 min   Activity Tolerance Patient tolerated treatment well   Behavior During Therapy [facility] for tasks assessed/performed      Past Medical History  Diagnosis Date  . Dysrhythmia   . Angina November 2011    Normal coronary angiogram; noncardiac symptom  . Heart murmur   . Asthma   . Hypertension   . COPD (chronic obstructive pulmonary disease)   . Bronchitis   . Pneumonia   . Sleep apnea     cpap machine x 9 years  . Hypothyroidism   . Adenoma     of thyroid gland, benign  . Chronic kidney disease     cyst on right kidney hx of  . Barrett's esophagus   . GERD (gastroesophageal reflux disease)   . Headache(784.0)   . Fibromyalgia   . Arthritis     osteoarthrits  . Depression   . Anxiety   . Diabetes mellitus   . Hyperlipidemia   . Morbid obesity     Status post gastric bypass surgery September 2013  . Osteoporosis   . Osteopenia     Past Surgical History  Procedure Laterality Date  . Knee arthroplasty      bilaterally  . Back surgery      spinal fusion  . Thyroidectomy    . Rotator cuff repair      2001  . Joint replacement    . Tonsillectomy      and adnoids removed  . Dilation and curettage of uterus    . Cardiac catheterization  05/03/2010    No significant CAD in L coronary system, normal LV function  . Nasal sinus surgery      1989 and mucous cele removed  . Esophagogastroduodenoscopy    . Colonoscopy    . Cardiovascular stress test  04/15/2007   EKG negative for ischemia, no significant ischemia demonstrated.  . Transthoracic echocardiogram  05/03/2021    EF 55-65%, normal  . Laparoscopic roux-en-y gastric bypass with upper endoscopy and removal of lap band      There were no vitals filed for this visit.  Visit Diagnosis:  Bilateral thoracic back pain  Activity intolerance  Bilateral low back pain without sciatica      Subjective Assessment - 11/19/14 0936    Subjective [facility] other [facility]. Pt repots very low CA Vit D levels. Pt is considering stopping smoking.   Currently in Pain? Yes   Pain Score 7    Pain Location Shoulder   Pain Orientation Right;Left   Pain Descriptors / Indicators Sharp   Aggravating Factors  Not sure   Pain Relieving Factors Heat, TENS, laying down   Multiple Pain Sites No                         [facility] Adult PT Treatment/Exercise - 11/19/14 0001    Lumbar Exercises: Aerobic   Stationary Bike Level 2 x  60min   Elliptical incline/resistance level3 x 6 min   UBE (Upper Arm Bike) L 1 x 67min backwards, sitting on green ball   Lumbar Exercises: Machines for Strengthening   Cybex Knee Flexion Lat pull: 15# 3x10, VC to keep scapula down   Leg Press St 5, 70# 3 x10   Other Lumbar Machine Exercise Pec press; 1 plate 2x5                PT Education - 11/19/14 0948    Education provided Yes   Education Details How to decided how much weight on the machines is too much or correct. Posture review.   Person(s) Educated Patient   Methods Explanation;Demonstration   Comprehension Verbalized understanding;Returned demonstration          PT Short Term Goals - 11/04/14 0902    PT SHORT TERM GOAL #1   Title be independent in initial HEP   Time 4   Period Weeks   PT SHORT TERM GOAL #2   Title report a 25% reduction in thoracic and lumbar pain with driving   Time 4   Period Weeks   Status Achieved   PT SHORT TERM GOAL #3   Title stand for 20 minutes  without need to rest   Time 4   Period Weeks   Status Achieved           PT Long Term Goals - 11/19/14 1001    PT LONG TERM GOAL #1   Title be independent in final HEP   Time 4   Period Weeks   Status On-going  Continue to modify   PT LONG TERM GOAL #2   Title reduce FOTO to < or = to 40% limitation   Time 4   Period Weeks   Status On-going   PT LONG TERM GOAL #3   Title report a 50% reduction in lumbar and thoracic pain with driving   Time 8   Period Weeks   Status Achieved   PT LONG TERM GOAL #4   Title report < or = to 4/10 thoracic pain and lumbar pain after standing for housework   Time 4   Period Weeks   Status On-going  Ranges bwteen 5-7/10               Plan - 11/19/14 0957    Clinical Impression Statement Patient needed to reduce the weight with her upper body exercises as it was aggrevating the joints. She also holds a tremendous amount of tension in her body as she exercises. We addressed this. She also has poor body awaren   Pt will benefit from skilled therapeutic intervention in order to improve on the following deficits Improper body mechanics;Decreased endurance;Postural dysfunction;Pain;Decreased activity tolerance   Rehab Potential Good   PT Frequency 1x / week   PT Duration 4 weeks   PT Treatment/Interventions ADLs/Self Care Home Management;Moist Heat;Therapeutic activities;Therapeutic exercise;Ultrasound;Manual techniques;Neuromuscular re-education;Electrical Stimulation   PT Next Visit Plan Follow pt as she transitions to the gym.     Consulted and Agree with Plan of Care Patient        Problem List Patient Active Problem List   Diagnosis Date Noted  . Essential hypertension 04/02/2013  . Hyperlipidemia   . History of Morbid obesity - status post gastric bypass surgery   . Other abnormal glucose 10/25/2011  . Other malaise and fatigue 10/25/2011  . Myalgia and myositis, unspecified 10/25/2011  . Degeneration of lumbar or  lumbosacral intervertebral disc  10/25/2011  . Degeneration of cervical intervertebral disc 10/25/2011  . Unspecified essential hypertension 10/25/2011  . OSA on CPAP 10/25/2011  . GERD (gastroesophageal reflux disease) 10/25/2011  . IBS (irritable bowel syndrome) 10/25/2011  . COPD (chronic obstructive pulmonary disease) 10/25/2011  . Other affections of shoulder region, not elsewhere classified 06/26/2011  . Post herpetic neuralgia 09/16/2003    Jaziyah Gradel, PTA 11/19/2014, 10:07 AM  [facility] [address], [place], [place], [address] Phone: [phone]   Fax:  [phone]

## 2014-11-22 ENCOUNTER — Ambulatory Visit (INDEPENDENT_AMBULATORY_CARE_PROVIDER_SITE_OTHER): Payer: BLUE CROSS/BLUE SHIELD | Admitting: Internal Medicine

## 2014-11-22 ENCOUNTER — Ambulatory Visit (INDEPENDENT_AMBULATORY_CARE_PROVIDER_SITE_OTHER): Payer: BLUE CROSS/BLUE SHIELD

## 2014-11-22 VITALS — BP 114/72 | HR 55 | Temp 99.1°F | Resp 16 | Ht 65.0 in | Wt 136.0 lb

## 2014-11-22 DIAGNOSIS — M25561 Pain in right knee: Secondary | ICD-10-CM | POA: Diagnosis not present

## 2014-11-22 NOTE — Progress Notes (Signed)
Subjective:  This chart was scribed for Tami Lin, MD by Moises Blood, Medical Scribe. This patient was seen in Room 10 and the patient's care was started at 8:51 PM.     Patient ID: Michaela Raymond, female    DOB: 01/30/1956, 59 y.o.   MRN: [id number]  HPI Michaela Raymond is a 59 y.o. female who presents to [facility] complaining of sudden onset right leg injury that occurred today. She was pulling a booster seat off of the shelf while cleaning the garage. The booster seat fell onto her right shin.  She has swelling and pain anteriorly with throbbing unit when at rest and nonweightbearing.  She has Osteoporosis following intestinal bypass and is concerned that she may have a fracture. She is taking 4000 mg Vitamin D QD plus Caltrate after suffering a fracture of the wrist 3 months ago.       Patient Active Problem List   Diagnosis Date Noted  . Essential hypertension 04/02/2013  . Hyperlipidemia   . History of Morbid obesity - status post gastric bypass surgery   . Other abnormal glucose 10/25/2011  . Other malaise and fatigue 10/25/2011  . Myalgia and myositis, unspecified 10/25/2011  . Degeneration of lumbar or lumbosacral intervertebral disc 10/25/2011  . Degeneration of cervical intervertebral disc 10/25/2011  . Unspecified essential hypertension 10/25/2011  . OSA on CPAP 10/25/2011  . GERD (gastroesophageal reflux disease) 10/25/2011  . IBS (irritable bowel syndrome) 10/25/2011  . COPD (chronic obstructive pulmonary disease) 10/25/2011  . Other affections of shoulder region, not elsewhere classified 06/26/2011  . Post herpetic neuralgia 09/16/2003   status post bilateral knee replacements Status post shoulder surgery 2   Current outpatient prescriptions:  .  acetaminophen (TYLENOL 8 HOUR) 650 MG CR tablet, Take 1,300 mg by mouth 2 (two) times daily., Disp: , Rfl:  .  alosetron (LOTRONEX) 0.5 MG tablet, Take 0.5 mg by mouth at bedtime.  , Disp: , Rfl:  .  amoxicillin  (AMOXIL) 250 MG/5ML suspension, Take 500 mg by mouth as needed (for dental procedures.)., Disp: , Rfl:  .  Biotin 1000 MCG tablet, Take 1,000 mcg by mouth 3 (three) times daily., Disp: , Rfl:  .  butalbital-acetaminophen-caffeine (FIORICET, ESGIC) 50-325-40 MG per tablet, Take 1 tablet by mouth as needed., Disp: , Rfl:  .  calcium-vitamin D (OSCAL WITH D) 500-200 MG-UNIT per tablet, Take 1 tablet by mouth daily., Disp: , Rfl:  .  CLIMARA PRO 0.045-0.015 MG/DAY, Place 1 patch onto the skin once a week., Disp: , Rfl: 11 .  desoximetasone (TOPICORT) 0.25 % cream, Apply 1 application topically as needed. , Disp: , Rfl:  .  estradiol (VIVELLE-DOT) 0.1 MG/24HR patch, Place 1 patch onto the skin once a week., Disp: , Rfl:  .  FeFum-FePoly-FA-B Cmp-C-Biot (FOLIVANE-PLUS) CAPS, Take 1 capsule by mouth daily., Disp: , Rfl:  .  fluticasone (FLONASE) 50 MCG/ACT nasal spray, Place 2 sprays into both nostrils daily. PATIENT NEEDS OFFICE VISIT FOR ADDITIONAL REFILLS, Disp: 16 g, Rfl: 0 .  gabapentin (NEURONTIN) 100 MG capsule, Take 100 mg by mouth 2 (two) times daily. 100mg  each morning, 300mg  each evening, Disp: , Rfl:  .  HYDROcodone-acetaminophen (NORCO/VICODIN) 5-325 MG per tablet, Take 1-2 tablets by mouth every 6 hours as needed for pain and/or cough., Disp: 7 tablet, Rfl: 0 .  levocetirizine (XYZAL) 5 MG tablet, Take 1 tablet by mouth daily., Disp: , Rfl: 2 .  levothyroxine (SYNTHROID, LEVOTHROID) 137 MCG tablet, Take 137 mcg  by mouth every morning.  , Disp: , Rfl:  .  lidocaine (LIDODERM) 5 %, Place 1-2 patches onto the skin daily. Remove & Discard patch within 12 hours. PATIENT NEEDS OFFICE VISIT FOR MORE, Disp: 30 patch, Rfl: 0 .  losartan (COZAAR) 25 MG tablet, Take 50 mg by mouth daily. , Disp: , Rfl:  .  montelukast (SINGULAIR) 10 MG tablet, Take 1 tablet (10 mg total) by mouth at bedtime. PATIENT NEEDS OFFICE VISIT FOR ADDITIONAL REFILLS - 2nd NOTICE, Disp: 30 tablet, Rfl: 0 .  Multiple Vitamin  (MULTIVITAMIN) tablet, Take 1 tablet by mouth daily., Disp: , Rfl:  .  omeprazole (PRILOSEC) 20 MG capsule, Take 20 mg by mouth 2 (two) times daily., Disp: , Rfl:  .  pantoprazole (PROTONIX) 20 MG tablet, Take 1 tablet by mouth daily., Disp: , Rfl: 11 .  zinc gluconate 50 MG tablet, Take 30 mg by mouth daily. , Disp: , Rfl:     Review of Systems Noncontributory    Objective:   Physical Exam BP 114/72 mmHg  Pulse 55  Temp(Src) 99.1 F (37.3 C)  Resp 16  Ht 5\' 5"  (1.651 m)  Wt 136 lb (61.689 kg)  BMI 22.63 kg/m2  SpO2 97% No acute distress The right knee has a good range of motion without swelling or contusion The right anterior shin has 2 areas that are tender and swollen A small laceration is seen at the upper contusion Gastrocnemius is normal The ankle is stable  [facility] reading (PRIMARY) by  Dr. Laney Pastor no bony injury to the tibia        Assessment & Plan:  Contusion leg Pain leg  She is reassured/May treat her symptoms with Tylenol and ice  I have completed the patient encounter in its entirety as documented by the scribe, with editing by me where necessary. Teresha Hanks P. Laney Pastor, M.D.

## 2014-11-25 ENCOUNTER — Ambulatory Visit: Payer: BLUE CROSS/BLUE SHIELD | Admitting: Physical Therapy

## 2014-11-25 ENCOUNTER — Telehealth: Payer: Self-pay

## 2014-11-25 ENCOUNTER — Encounter: Payer: Self-pay | Admitting: Physical Therapy

## 2014-11-25 DIAGNOSIS — M546 Pain in thoracic spine: Secondary | ICD-10-CM

## 2014-11-25 DIAGNOSIS — M545 Low back pain, unspecified: Secondary | ICD-10-CM

## 2014-11-25 DIAGNOSIS — R6889 Other general symptoms and signs: Secondary | ICD-10-CM

## 2014-11-25 NOTE — Therapy (Signed)
[facility] [address], [place], [place], [address] Phone: [phone]   Fax:  [phone]  Physical Therapy Treatment  Patient Details  Name: Michaela Raymond MRN: [id number] Date of Birth: 29-May-1956 Referring Provider:  Nena Polio, NP  Encounter Date: 11/25/2014      PT End of Session - 11/25/14 1205    Visit Number 18   Date for PT Re-Evaluation 12/07/14   PT Start Time 1145   PT Stop Time 1230   PT Time Calculation (min) 45 min   Activity Tolerance Patient tolerated treatment well   Behavior During Therapy [facility] for tasks assessed/performed      Past Medical History  Diagnosis Date  . Dysrhythmia   . Angina November 2011    Normal coronary angiogram; noncardiac symptom  . Heart murmur   . Asthma   . Hypertension   . COPD (chronic obstructive pulmonary disease)   . Bronchitis   . Pneumonia   . Sleep apnea     cpap machine x 9 years  . Hypothyroidism   . Adenoma     of thyroid gland, benign  . Chronic kidney disease     cyst on right kidney hx of  . Barrett's esophagus   . GERD (gastroesophageal reflux disease)   . Headache(784.0)   . Fibromyalgia   . Arthritis     osteoarthrits  . Depression   . Anxiety   . Diabetes mellitus   . Hyperlipidemia   . Morbid obesity     Status post gastric bypass surgery September 2013  . Osteoporosis   . Osteopenia     Past Surgical History  Procedure Laterality Date  . Knee arthroplasty      bilaterally  . Back surgery      spinal fusion  . Thyroidectomy    . Rotator cuff repair      2001  . Joint replacement    . Tonsillectomy      and adnoids removed  . Dilation and curettage of uterus    . Cardiac catheterization  05/03/2010    No significant CAD in L coronary system, normal LV function  . Nasal sinus surgery      1989 and mucous cele removed  . Esophagogastroduodenoscopy    . Colonoscopy    . Cardiovascular stress test  04/15/2007   EKG negative for ischemia, no significant ischemia demonstrated.  . Transthoracic echocardiogram  05/03/2021    EF 55-65%, normal  . Laparoscopic roux-en-y gastric bypass with upper endoscopy and removal of lap band      There were no vitals filed for this visit.  Visit Diagnosis:  Bilateral thoracic back pain  Activity intolerance  Bilateral low back pain without sciatica      Subjective Assessment - 11/25/14 1152    Subjective Pt feels pretty good today even after working in the garden.   Pertinent History Lumbar fusion: L4-S1 in 2010   Currently in Pain? Yes   Pain Score 3    Pain Location Shoulder   Pain Orientation Right;Left   Pain Descriptors / Indicators Sharp   Pain Type Chronic pain   Pain Onset More than a month ago   Multiple Pain Sites No                         [facility] Adult PT Treatment/Exercise - 11/25/14 0001    Lumbar Exercises: Aerobic   Stationary Bike Level 2 x 19min  Elliptical incline/resistance level3 x 6 min   UBE (Upper Arm Bike) L 1 x 19min backwards, sitting on green ball   Lumbar Exercises: Machines for Strengthening   Leg Press St 5, 75# 2 x10, #70# 1x10   Lumbar Exercises: Standing   Other Standing Lumbar Exercises Standing rows 25# 3 x10 with focus on abdominal activation  trieps stretch bw/ set's   Lumbar Exercises: Supine   Other Supine Lumbar Exercises Foam roll: TA activation with unilateral ER, marching and B UE abd with pelvic floor contraction  UE into bil Abduction with red t-band 2 x10   Other Supine Lumbar Exercises foam roll x 3 min                   PT Short Term Goals - 11/04/14 0902    PT SHORT TERM GOAL #1   Title be independent in initial HEP   Time 4   Period Weeks   PT SHORT TERM GOAL #2   Title report a 25% reduction in thoracic and lumbar pain with driving   Time 4   Period Weeks   Status Achieved   PT SHORT TERM GOAL #3   Title stand for 20 minutes without need to rest   Time 4    Period Weeks   Status Achieved           PT Long Term Goals - 11/25/14 1223    PT LONG TERM GOAL #3   Title report a 50% reduction in lumbar and thoracic pain with driving   Time 8   Period Weeks   Status Achieved   PT LONG TERM GOAL #4   Title report < or = to 4/10 thoracic pain and lumbar pain after standing for housework   Time 4   Period Weeks   Status Achieved   PT LONG TERM GOAL #5   Title safely progress to gym exercises and verbalize how to progress   Time 4   Period Weeks   Status On-going               Plan - 11/25/14 1206    Clinical Impression Statement Patient feels stronger, Pt is going to the [employer] 2x  week and is now more confident how to proceed with advancement of gym exercise   Rehab Potential Good   PT Frequency 1x / week   PT Duration 4 weeks   PT Treatment/Interventions ADLs/Self Care Home Management;Moist Heat;Therapeutic activities;Therapeutic exercise;Ultrasound;Manual techniques;Neuromuscular re-education;Electrical Stimulation   PT Next Visit Plan Follow pt as she transitions to the gym.     PT Home Exercise Plan continue to establish gym program   Consulted and Agree with Plan of Care Patient        Problem List Patient Active Problem List   Diagnosis Date Noted  . Essential hypertension 04/02/2013  . Hyperlipidemia   . History of Morbid obesity - status post gastric bypass surgery   . Other abnormal glucose 10/25/2011  . Other malaise and fatigue 10/25/2011  . Myalgia and myositis, unspecified 10/25/2011  . Degeneration of lumbar or lumbosacral intervertebral disc 10/25/2011  . Degeneration of cervical intervertebral disc 10/25/2011  . Unspecified essential hypertension 10/25/2011  . OSA on CPAP 10/25/2011  . GERD (gastroesophageal reflux disease) 10/25/2011  . IBS (irritable bowel syndrome) 10/25/2011  . COPD (chronic obstructive pulmonary disease) 10/25/2011  . Other affections of shoulder region, not elsewhere classified  06/26/2011  . Post herpetic neuralgia 09/16/2003    NAUMANN-HOUEGNIFIO,Aliha Diedrich PTA 11/25/2014,  12:45 PM  [facility] [address] [place], [place], [address] Phone: [phone]   Fax:  [phone]

## 2014-11-25 NOTE — Telephone Encounter (Signed)
Patient left voicemail today at 2:22pm requesting copies of records for a condition she has. She needs records for new PCP to review. Stated in voicemail that she will bring a signed ROI form to the office. Cb# [phone].

## 2014-11-29 NOTE — Telephone Encounter (Signed)
No ROI as of today (11/29/14).

## 2014-11-30 ENCOUNTER — Ambulatory Visit: Payer: BLUE CROSS/BLUE SHIELD | Admitting: Physical Therapy

## 2014-11-30 ENCOUNTER — Encounter: Payer: Self-pay | Admitting: Physical Therapy

## 2014-11-30 DIAGNOSIS — R6889 Other general symptoms and signs: Secondary | ICD-10-CM

## 2014-11-30 DIAGNOSIS — M546 Pain in thoracic spine: Secondary | ICD-10-CM

## 2014-11-30 DIAGNOSIS — M545 Low back pain, unspecified: Secondary | ICD-10-CM

## 2014-11-30 NOTE — Therapy (Signed)
[facility] [address], [place], [place], [address] Phone: [phone]   Fax:  [phone]  Physical Therapy Treatment  Patient Details  Name: Michaela Raymond MRN: [id number] Date of Birth: 28-May-1956 Referring Provider:  Nena Polio, NP  Encounter Date: 11/30/2014      PT End of Session - 11/30/14 1122    Visit Number 19   Date for PT Re-Evaluation 12/07/14   PT Start Time 1100   PT Stop Time 1145   PT Time Calculation (min) 45 min   Activity Tolerance Patient tolerated treatment well   Behavior During Therapy [facility] for tasks assessed/performed      Past Medical History  Diagnosis Date  . Dysrhythmia   . Angina November 2011    Normal coronary angiogram; noncardiac symptom  . Heart murmur   . Asthma   . Hypertension   . COPD (chronic obstructive pulmonary disease)   . Bronchitis   . Pneumonia   . Sleep apnea     cpap machine x 9 years  . Hypothyroidism   . Adenoma     of thyroid gland, benign  . Chronic kidney disease     cyst on right kidney hx of  . Barrett's esophagus   . GERD (gastroesophageal reflux disease)   . Headache(784.0)   . Fibromyalgia   . Arthritis     osteoarthrits  . Depression   . Anxiety   . Diabetes mellitus   . Hyperlipidemia   . Morbid obesity     Status post gastric bypass surgery September 2013  . Osteoporosis   . Osteopenia     Past Surgical History  Procedure Laterality Date  . Knee arthroplasty      bilaterally  . Back surgery      spinal fusion  . Thyroidectomy    . Rotator cuff repair      2001  . Joint replacement    . Tonsillectomy      and adnoids removed  . Dilation and curettage of uterus    . Cardiac catheterization  05/03/2010    No significant CAD in L coronary system, normal LV function  . Nasal sinus surgery      1989 and mucous cele removed  . Esophagogastroduodenoscopy    . Colonoscopy    . Cardiovascular stress test  04/15/2007     EKG negative for ischemia, no significant ischemia demonstrated.  . Transthoracic echocardiogram  05/03/2021    EF 55-65%, normal  . Laparoscopic roux-en-y gastric bypass with upper endoscopy and removal of lap band      There were no vitals filed for this visit.  Visit Diagnosis:  Bilateral thoracic back pain  Activity intolerance  Bilateral low back pain without sciatica      Subjective Assessment - 11/30/14 1109    Subjective Pt has Lidocaine patches for back what helps   Pertinent History Lumbar fusion: L4-S1 in 2010   Currently in Pain? Yes   Pain Score 5    Pain Score 5   Pain Location Back   Pain Orientation Right;Left   Pain Descriptors / Indicators Aching   Pain Type Chronic pain   Pain Onset More than a month ago   Pain Frequency Constant                         [facility] Adult PT Treatment/Exercise - 11/30/14 0001    Lumbar Exercises: Aerobic   Stationary Bike Level  2 x 3mn   Elliptical incline/resistance level3 x 4 min  pt with less energy, today need to stop after 447m   UBE (Upper Arm Bike) L 3 x 67m53m(4'/4)   Lumbar Exercises: Machines for Strengthening   Leg Press St 5, 75# 2 x10, #70# 1x10   Other Lumbar Machine Exercise Pec press; 1 plate 2x5  horizontal and vertical grip   Lumbar Exercises: Standing   Other Standing Lumbar Exercises Standing rows 25# 3 x10 with focus on abdominal activation  stretch b/w triceps   Lumbar Exercises: Supine   Other Supine Lumbar Exercises foam roll x 3 min                   PT Short Term Goals - 11/30/14 1126    PT SHORT TERM GOAL #1   Title be independent in initial HEP   Time 4   Period Weeks   Status Achieved   PT SHORT TERM GOAL #2   Title report a 25% reduction in thoracic and lumbar pain with driving   Time 4   Period Weeks   Status Achieved   PT SHORT TERM GOAL #3   Title stand for 20 minutes without need to rest   Time 4   Period Weeks   Status Achieved           PT  Long Term Goals - 11/30/14 1127    PT LONG TERM GOAL #1   Title be independent in final HEP   Time 4   Period Weeks   Status On-going   PT LONG TERM GOAL #2   Title reduce FOTO to < or = to 40% limitation   Time 4   Period Weeks   Status On-going   PT LONG TERM GOAL #3   Title report a 50% reduction in lumbar and thoracic pain with driving   Time 8   Period Weeks   Status Achieved   PT LONG TERM GOAL #4   Title report < or = to 4/10 thoracic pain and lumbar pain after standing for housework   Time 4   Period Weeks   Status Achieved   PT LONG TERM GOAL #5   Title safely progress to gym exercises and verbalize how to progress   Time 4   Period Weeks   Status Partially Met               Plan - 11/30/14 1124    Clinical Impression Statement Patient feels stronger and more confident with the gym program   Pt will benefit from skilled therapeutic intervention in order to improve on the following deficits Improper body mechanics;Decreased endurance;Postural dysfunction;Pain;Decreased activity tolerance   Rehab Potential Good   PT Frequency 1x / week   PT Duration 4 weeks   PT Treatment/Interventions ADLs/Self Care Home Management;Moist Heat;Therapeutic activities;Therapeutic exercise;Ultrasound;Manual techniques;Neuromuscular re-education;Electrical Stimulation   PT Next Visit Plan D/C next week     Consulted and Agree with Plan of Care Patient        Problem List Patient Active Problem List   Diagnosis Date Noted  . Essential hypertension 04/02/2013  . Hyperlipidemia   . History of Morbid obesity - status post gastric bypass surgery   . Other abnormal glucose 10/25/2011  . Other malaise and fatigue 10/25/2011  . Myalgia and myositis, unspecified 10/25/2011  . Degeneration of lumbar or lumbosacral intervertebral disc 10/25/2011  . Degeneration of cervical intervertebral disc 10/25/2011  . Unspecified essential hypertension 10/25/2011  .  OSA on CPAP 10/25/2011   . GERD (gastroesophageal reflux disease) 10/25/2011  . IBS (irritable bowel syndrome) 10/25/2011  . COPD (chronic obstructive pulmonary disease) 10/25/2011  . Other affections of shoulder region, not elsewhere classified 06/26/2011  . Post herpetic neuralgia 09/16/2003    NAUMANN-HOUEGNIFIO,Zoiee Wimmer PTA 11/30/2014, 1:28 PM  [facility] [address], [place], [place], [address] Phone: [phone]   Fax:  [phone]

## 2014-12-02 NOTE — Telephone Encounter (Signed)
Still, no release form received.

## 2014-12-08 ENCOUNTER — Ambulatory Visit: Payer: BLUE CROSS/BLUE SHIELD

## 2014-12-08 DIAGNOSIS — R6889 Other general symptoms and signs: Secondary | ICD-10-CM

## 2014-12-08 DIAGNOSIS — M546 Pain in thoracic spine: Secondary | ICD-10-CM | POA: Diagnosis not present

## 2014-12-08 NOTE — Therapy (Signed)
[facility] [address], [place], [place], [address] Phone: [phone]   Fax:  [phone]  Physical Therapy Treatment  Patient Details  Name: Michaela Raymond MRN: [id number] Date of Birth: May 30, 1956 Referring Provider:  Nena Polio, NP/ Dr Laurence Spates  Encounter Date: 12/08/2014      PT End of Session - 12/08/14 1139    Visit Number 20   PT Start Time 1105   PT Stop Time 1146   PT Time Calculation (min) 41 min   Activity Tolerance Patient tolerated treatment well   Behavior During Therapy [facility] for tasks assessed/performed      Past Medical History  Diagnosis Date  . Dysrhythmia   . Angina November 2011    Normal coronary angiogram; noncardiac symptom  . Heart murmur   . Asthma   . Hypertension   . COPD (chronic obstructive pulmonary disease)   . Bronchitis   . Pneumonia   . Sleep apnea     cpap machine x 9 years  . Hypothyroidism   . Adenoma     of thyroid gland, benign  . Chronic kidney disease     cyst on right kidney hx of  . Barrett's esophagus   . GERD (gastroesophageal reflux disease)   . Headache(784.0)   . Fibromyalgia   . Arthritis     osteoarthrits  . Depression   . Anxiety   . Diabetes mellitus   . Hyperlipidemia   . Morbid obesity     Status post gastric bypass surgery September 2013  . Osteoporosis   . Osteopenia     Past Surgical History  Procedure Laterality Date  . Knee arthroplasty      bilaterally  . Back surgery      spinal fusion  . Thyroidectomy    . Rotator cuff repair      2001  . Joint replacement    . Tonsillectomy      and adnoids removed  . Dilation and curettage of uterus    . Cardiac catheterization  05/03/2010    No significant CAD in L coronary system, normal LV function  . Nasal sinus surgery      1989 and mucous cele removed  . Esophagogastroduodenoscopy    . Colonoscopy    . Cardiovascular stress test  04/15/2007    EKG negative for  ischemia, no significant ischemia demonstrated.  . Transthoracic echocardiogram  05/03/2021    EF 55-65%, normal  . Laparoscopic roux-en-y gastric bypass with upper endoscopy and removal of lap band      There were no vitals filed for this visit.  Visit Diagnosis:  Activity intolerance      Subjective Assessment - 12/08/14 1106    Subjective Ready for D/C   Currently in Pain? No/denies            [facility] PT Assessment - 12/08/14 0001    Assessment   Medical Diagnosis LBP (M54.5) thoracic pain (M54.6)   Onset Date/Surgical Date 08/25/14   Prior Function   Level of Independence Independent with basic ADLs   Vocation Other (comment)  No currently working.  She cares for her 26 year daughter   Observation/Other Assessments   Focus on Therapeutic Outcomes (FOTO)  35% limitation                     [facility] Adult PT Treatment/Exercise - 12/08/14 0001    Lumbar Exercises: Aerobic   Stationary Bike Level 2 x  9 min   Elliptical incline/resistance level3 x 4 min   UBE (Upper Arm Bike) L 3 x 84mn (4'/4)  reverse-PT present to discuss progress   Lumbar Exercises: Machines for Strengthening   Leg Press St 5, 75# 2 x10, #70# 1x10   Other Lumbar Machine Exercise Pec press; 1 plate 2x5  horizontal and vertical grip   Lumbar Exercises: Standing   Other Standing Lumbar Exercises Standing rows 25# 3 x10 with focus on abdominal activation  stretch b/w triceps                  PT Short Term Goals - 11/30/14 1126    PT SHORT TERM GOAL #1   Title be independent in initial HEP   Time 4   Period Weeks   Status Achieved   PT SHORT TERM GOAL #2   Title report a 25% reduction in thoracic and lumbar pain with driving   Time 4   Period Weeks   Status Achieved   PT SHORT TERM GOAL #3   Title stand for 20 minutes without need to rest   Time 4   Period Weeks   Status Achieved           PT Long Term Goals - 12/08/14 1112    PT LONG TERM GOAL #1   Title be  independent in final HEP   Time 4   Period Weeks   Status Achieved   PT LONG TERM GOAL #2   Title reduce FOTO to < or = to 40% limitation   Status Achieved  35% limitation   PT LONG TERM GOAL #3   Title report a 50% reduction in lumbar and thoracic pain with driving   Status Achieved  70% improvement   PT LONG TERM GOAL #4   Title report < or = to 4/10 thoracic pain and lumbar pain after standing for housework   Status Achieved  0/10 pain reported   PT LONG TERM GOAL #5   Title safely progress to gym exercises and verbalize how to progress   Status Achieved               Plan - 12/08/14 1120    Clinical Impression Statement Pt with 70% overall improvement since the start of care.  Pt is independent in an advanced gym program and HEP for continued progress.  Pt has met all goals.  Pt with mild to moderate pain in the lumbar and thoracic spine with endurance activities, as the day progresses and standing still.     PT Next Visit Plan D/C PT to HEP        Problem List Patient Active Problem List   Diagnosis Date Noted  . Essential hypertension 04/02/2013  . Hyperlipidemia   . History of Morbid obesity - status post gastric bypass surgery   . Other abnormal glucose 10/25/2011  . Other malaise and fatigue 10/25/2011  . Myalgia and myositis, unspecified 10/25/2011  . Degeneration of lumbar or lumbosacral intervertebral disc 10/25/2011  . Degeneration of cervical intervertebral disc 10/25/2011  . Unspecified essential hypertension 10/25/2011  . OSA on CPAP 10/25/2011  . GERD (gastroesophageal reflux disease) 10/25/2011  . IBS (irritable bowel syndrome) 10/25/2011  . COPD (chronic obstructive pulmonary disease) 10/25/2011  . Other affections of shoulder region, not elsewhere classified 06/26/2011  . Post herpetic neuralgia 09/16/2003  PHYSICAL THERAPY DISCHARGE SUMMARY  Visits from Start of Care: 20  Current functional level related to goals / functional  outcomes: See  above for goal assessment.    Remaining deficits: Mild to moderate back pain with endurance tasks and housework. Pt has HEP in place to address remaining deficits.  Thank you for this referral.    Education / Equipment: HEP, body mechanics, gym exercises Plan: Patient agrees to discharge.  Patient goals were met. Patient is being discharged due to meeting the stated rehab goals.  ?????      Brevyn Ring, PT 12/08/2014, 11:40 AM  [facility] [address] [place], [place], [address] Phone: [phone]   Fax:  [phone]

## 2015-03-18 ENCOUNTER — Other Ambulatory Visit: Payer: Self-pay

## 2015-07-19 ENCOUNTER — Ambulatory Visit (INDEPENDENT_AMBULATORY_CARE_PROVIDER_SITE_OTHER): Payer: BLUE CROSS/BLUE SHIELD | Admitting: Cardiology

## 2015-07-19 ENCOUNTER — Encounter: Payer: Self-pay | Admitting: Cardiology

## 2015-07-19 VITALS — BP 116/76 | HR 51 | Ht 65.0 in | Wt 144.0 lb

## 2015-07-19 DIAGNOSIS — E785 Hyperlipidemia, unspecified: Secondary | ICD-10-CM

## 2015-07-19 DIAGNOSIS — G4733 Obstructive sleep apnea (adult) (pediatric): Secondary | ICD-10-CM | POA: Diagnosis not present

## 2015-07-19 DIAGNOSIS — Z716 Tobacco abuse counseling: Secondary | ICD-10-CM

## 2015-07-19 DIAGNOSIS — I10 Essential (primary) hypertension: Secondary | ICD-10-CM

## 2015-07-19 DIAGNOSIS — Z9989 Dependence on other enabling machines and devices: Secondary | ICD-10-CM

## 2015-07-19 NOTE — Patient Instructions (Signed)
No change with current medications  Your physician wants you to follow-up in 12  Months with DR HARDING.  You will receive a reminder letter in the mail two months in advance. If you don't receive a letter, please call our office to schedule the follow-up appointment.  If you need a refill on your cardiac medications before your next appointment, please call your pharmacy.

## 2015-07-19 NOTE — Progress Notes (Signed)
PCP: Nena Polio, NP  Clinic Note: Chief Complaint  Patient presents with  . Follow-up    no chest pain, no shortness of breath, no swelling, no cramping, no dizziness or lightheadedess    HPI: Michaela Raymond is a 60 y.o. female with a PMH below who presents today for annual f/u for aortic atherosclerosis and hypertension.  S/p GOP > 3 yrs ago with significant wgt loss.  Michaela Raymond was last seen on Jul 07 2014 - (new Grandmother).   Recent Hospitalizations: none  Studies Reviewed: no new studies  Still smoking - hopes to stop.  Interval History: Michaela Raymond returns today again relatively stable from a cardiac standpoint. She is low but frustrated by her weight gain. This is the first time she is gone backwards with weight since her gastric bypass surgery. She now has some fleeting episodes of palpitations or pinching chest discomfort, really denies any significant symptoms. She is hoping to finally for finalize the divorce that is a long time and coming. I think a good portion of the visit was therapeutic for her discussing these issues. Draped past evaluation we had tried to maybe make her Cozaar 25 mg twice a day to break up the dosing. She finally went back to just taken it once daily at 50 mg. Tolerating it well without combinations. She does have some mild neuropathy pain that is therefore on Neurontin. She denies any significant rapid irregular heartbeats or palpitations.  No chest pain or shortness of breath with rest or exertion.  No PND, orthopnea or edema.  No syncope/near syncope, or TIA/amaurosis fugax symptoms. No claudication.  ROS: A comprehensive was performed. Review of Systems  Constitutional: Negative for malaise/fatigue.  Respiratory: Positive for cough (occasional). Negative for shortness of breath and wheezing.   Cardiovascular: Negative for claudication.  Gastrointestinal: Positive for heartburn (Occasionally since her gastric bypass surgery.). Negative  for blood in stool and melena.  Genitourinary: Positive for hematuria (Only when she's had a UTI.).  Musculoskeletal: Positive for back pain (notably improved with weight loss). Negative for falls.  Neurological: Positive for dizziness (Occasionally positional) and headaches (She is on medications for headaches.). Negative for weakness.  Psychiatric/Behavioral: Negative.  Negative for depression and memory loss. The patient is not nervous/anxious and does not have insomnia.   All other systems reviewed and are negative.   Past Medical History  Diagnosis Date  . Dysrhythmia   . Angina November 2011    Normal coronary angiogram; noncardiac symptom  . Heart murmur   . Asthma   . Hypertension   . COPD (chronic obstructive pulmonary disease) ([place])   . Bronchitis   . Pneumonia   . Sleep apnea     cpap machine x 9 years  . Hypothyroidism   . Adenoma     of thyroid gland, benign  . Chronic kidney disease     cyst on right kidney hx of  . Barrett's esophagus   . GERD (gastroesophageal reflux disease)   . Headache(784.0)   . Fibromyalgia   . Arthritis     osteoarthrits  . Depression   . Anxiety   . Diabetes mellitus   . Hyperlipidemia   . Morbid obesity ([place])     Status post gastric bypass surgery September 2013  . Osteoporosis   . Osteopenia     Past Surgical History  Procedure Laterality Date  . Knee arthroplasty      bilaterally  . Back surgery      spinal fusion  .  Thyroidectomy    . Rotator cuff repair      2001  . Joint replacement    . Tonsillectomy      and adnoids removed  . Dilation and curettage of uterus    . Cardiac catheterization  05/03/2010    No significant CAD in L coronary system, normal LV function  . Nasal sinus surgery      1989 and mucous cele removed  . Esophagogastroduodenoscopy    . Colonoscopy    . Cardiovascular stress test  04/15/2007    EKG negative for ischemia, no significant ischemia demonstrated.  . Transthoracic echocardiogram   05/03/2021    EF 55-65%, normal  . Laparoscopic roux-en-y gastric bypass with upper endoscopy and removal of lap band     Prior to Admission medications   Medication Sig Start Date End Date Taking? Authorizing Provider  acetaminophen (TYLENOL 8 HOUR) 650 MG CR tablet Take 1,300 mg by mouth 2 (two) times daily.   Yes Historical Provider, MD  alosetron (LOTRONEX) 0.5 MG tablet Take 0.5 mg by mouth at bedtime.     Yes Historical Provider, MD  amoxicillin (AMOXIL) 250 MG/5ML suspension Take 500 mg by mouth as needed (for dental procedures.). 08/26/12  Yes Ryan M Dunn, PA-C  Biotin 1000 MCG tablet Take 1,000 mcg by mouth 3 (three) times daily.   Yes Historical Provider, MD  butalbital-acetaminophen-caffeine (FIORICET, ESGIC) 50-325-40 MG per tablet Take 1 tablet by mouth as needed. 03/24/13  Yes Historical Provider, MD  calcium citrate-vitamin D (CITRACAL+D) 315-200 MG-UNIT tablet Take 4 tablets by mouth once.   Yes Historical Provider, MD  Cholecalciferol (VITAMIN D3) 2000 units TABS Take by mouth 2 (two) times daily.   Yes Historical Provider, MD  CLIMARA PRO 0.045-0.015 MG/DAY Place 1 patch onto the skin once a week. 06/29/14  Yes Historical Provider, MD  fluticasone (FLONASE) 50 MCG/ACT nasal spray Place 2 sprays into both nostrils daily. PATIENT NEEDS OFFICE VISIT FOR ADDITIONAL REFILLS   Yes Heather M Marte, PA-C  gabapentin (NEURONTIN) 100 MG capsule Take 100 mg by mouth 2 (two) times daily. 100mg  each morning, 300mg  each evening   Yes Historical Provider, MD  levocetirizine (XYZAL) 5 MG tablet Take 1 tablet by mouth daily. 06/15/14  Yes Historical Provider, MD  levothyroxine (SYNTHROID, LEVOTHROID) 137 MCG tablet Take 137 mcg by mouth every morning.     Yes Historical Provider, MD  lidocaine (LIDODERM) 5 % Place 1-2 patches onto the skin daily. Remove & Discard patch within 12 hours. PATIENT NEEDS OFFICE VISIT FOR MORE 12/23/13  Yes Darlyne Russian, MD  losartan (COZAAR) 25 MG tablet Take 50 mg by  mouth daily.    Yes Historical Provider, MD  montelukast (SINGULAIR) 10 MG tablet Take 1 tablet (10 mg total) by mouth at bedtime. PATIENT NEEDS OFFICE VISIT FOR ADDITIONAL REFILLS - 2nd NOTICE 07/14/13  Yes Theda Sers, PA-C  Multiple Vitamin (MULTIVITAMIN) tablet Take 1 tablet by mouth daily.   Yes Historical Provider, MD  pantoprazole (PROTONIX) 20 MG tablet Take 1 tablet by mouth daily. 06/22/14  Yes Historical Provider, MD     Allergies  Allergen Reactions  . Sulfa Antibiotics Anaphylaxis, Hives, Itching and Swelling  . Asa [Aspirin]     GI upset  . Coreg   . Escitalopram Oxalate     Panic attack   . Fenofibrate   . Ibuprofen   . Talwin [Pentazocine Lactate] Nausea And Vomiting  . Tape Itching    Hives. Only use  paper or cloth tape  . Verapamil   . Zithromax [Azithromycin]     Severe Abdominal cramping    Social History   Social History  . Marital Status: Married    Spouse Name: N/A  . Number of Children: 3  . Years of Education: N/A   Occupational History  . unemployed    Social History Main Topics  . Smoking status: Current Every Day Smoker -- 0.50 packs/day for 30 years    Start date: 02/19/2012  . Smokeless tobacco: None  . Alcohol Use: 0.0 oz/week    0 Standard drinks or equivalent per week     Comment: occassional wine  . Drug Use: No  . Sexual Activity: Yes   Other Topics Concern  . None   Social History Narrative   She is a separated mother of 3.  2 children from her first marriage and one from the second one.  She has one grandchild.  He she quit smoking in September 2013.  She does not drink.  She exercises at least 2-3 days a week doing either swimming biking or running.   Family History  Problem Relation Age of Onset  . Diabetes Father   . Hypertension Father   . Heart failure Father   . COPD Father   . Heart attack Father   . Arthritis Father   . Heart murmur Mother   . Hypothyroidism Mother   . Heart disease Mother   . Mental illness  Sister   . Hypertension Brother   . Heart murmur Brother   . Arrhythmia Maternal Grandmother     Atrial Fibrillation  . COPD Maternal Grandmother   . Cancer Maternal Grandfather   . Alzheimer's disease Paternal Grandmother   . COPD Paternal Grandfather   . Heart failure Paternal Grandfather   . Allergies Son   . Allergies Daughter   . Allergies Daughter      Wt Readings from Last 3 Encounters:  07/19/15 144 lb (65.318 kg)  11/22/14 136 lb (61.689 kg)  10/25/14 136 lb (61.689 kg)    PHYSICAL EXAM BP 116/76 mmHg  Pulse 51  Ht 5\' 5"  (1.651 m)  Wt 144 lb (65.318 kg)  BMI 23.96 kg/m2 General appearance: alert, cooperative, appears stated age, no distress; thin, healthy-appearing Neck: no adenopathy, no carotid bruit and no JVD Lungs: clear to auscultation bilaterally, normal percussion bilaterally and non-labored Heart: regular rate and rhythm, S1, S2 normal, soft 1/6 SEM at the RUSB. Also soft S4., click, rub or gallop; nondisplaced Abdomen: soft, non-tender; bowel sounds normal; no masses, no organomegaly; residual skin status post weight loss Extremities: extremities normal, atraumatic, no cyanosis or edema Pulses: 2+ and symmetric; Neurologic: Mental status: Alert, oriented, thought content appropriate Cranial nerves: normal (II-XII grossly intact)   Adult ECG Report  Rate: 51 ;  Rhythm: sinus bradycardia and Relatively low-voltage in precordial leads. There is poor R-wave progression in the septal leads, cannot exclude anterior MI age undetermined (although this is known to be false);   Narrative Interpretation: Relatively stable EKG   Other studies Reviewed: Additional studies/ records that were reviewed today include:  Recent Labs:  N/A - checked by PCP.  ASSESSMENT / PLAN: Problem List Items Addressed This Visit    Tobacco abuse counseling (Chronic)    Smoking cessation instruction/counseling given:  counseled patient on the dangers of tobacco use, advised  patient to stop smoking, and reviewed strategies to maximize success. We discussed NicoDerm patches or Nicorette gum. We also talked  about having other distractors to take away from the cravings and need to have something for fixation. She just then needs to choose a quit date of folks. We talked about [employer] Quit Line. Total of about 6-8 minutes spent      Relevant Orders   EKG 12-Lead (Completed)   OSA on CPAP (Chronic)   Hyperlipidemia - Primary    Previously on a statin, but has stopped it because of improved lipids since losing weight. Labs are followed by PCP. I don't have results.      Relevant Orders   EKG 12-Lead (Completed)   History of Morbid obesity - status post gastric bypass surgery (Chronic)    She's done quite well and weight loss. She is now going to check again on her diet as he has gained some weight over the last month or so. The malabsorption issues seem to have stabilized. She is starting insulin with her current weight and notices significantly improved energy levels.      Essential hypertension (Chronic)    Originally after her nephrectomy, it was poorly controlled, now as well controlled. She is on losartan with excellent blood pressure.      Relevant Orders   EKG 12-Lead (Completed)      Current medicines are reviewed at length with the patient today. (+/- concerns) none The following changes have been made: None   Studies Ordered:   Orders Placed This Encounter  Procedures  . EKG 12-Lead     follow-up in 12  Months with DR Aleli Navedo.    Leonie Man, M.D., M.S. Interventional Cardiologist   Pager # [phone] Phone # [phone] [address] [place], [place] [address]

## 2015-07-20 NOTE — Assessment & Plan Note (Signed)
She's done quite well and weight loss. She is now going to check again on her diet as he has gained some weight over the last month or so. The malabsorption issues seem to have stabilized. She is starting insulin with her current weight and notices significantly improved energy levels.

## 2015-07-20 NOTE — Assessment & Plan Note (Signed)
Previously on a statin, but has stopped it because of improved lipids since losing weight. Labs are followed by PCP. I don't have results.

## 2015-07-20 NOTE — Assessment & Plan Note (Signed)
Originally after her nephrectomy, it was poorly controlled, now as well controlled. She is on losartan with excellent blood pressure.

## 2015-07-20 NOTE — Assessment & Plan Note (Addendum)
Smoking cessation instruction/counseling given:  counseled patient on the dangers of tobacco use, advised patient to stop smoking, and reviewed strategies to maximize success. We discussed NicoDerm patches or Nicorette gum. We also talked about having other distractors to take away from the cravings and need to have something for fixation. She just then needs to choose a quit date of folks. We talked about  Quit Line. Total of about 6-8 minutes spent

## 2015-10-12 IMAGING — CR DG TIBIA/FIBULA 2V*R*
2 series · 2 of 2 positions shown · non-contrast
Comparison: None.

CLINICAL DATA: Nivirus leg pain.

EXAM:
RIGHT TIBIA AND FIBULA - 2 VIEW

[AP]
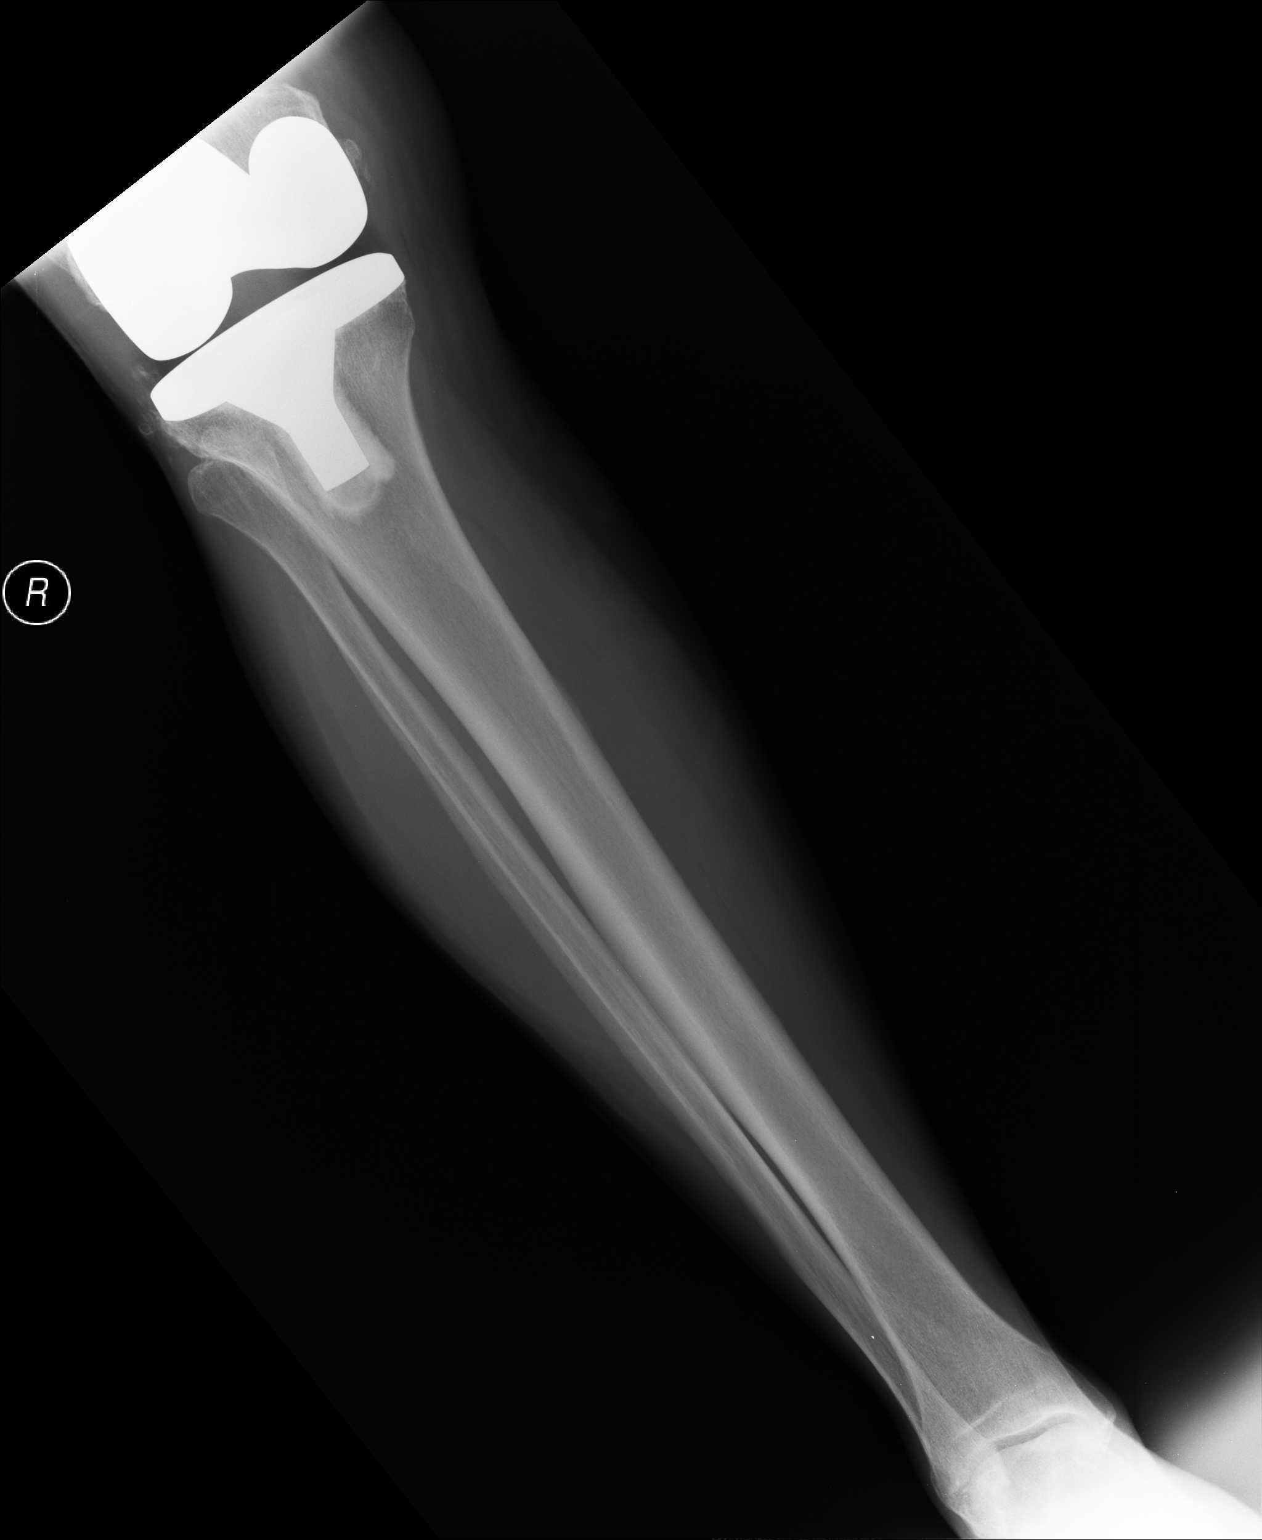

[lateral]
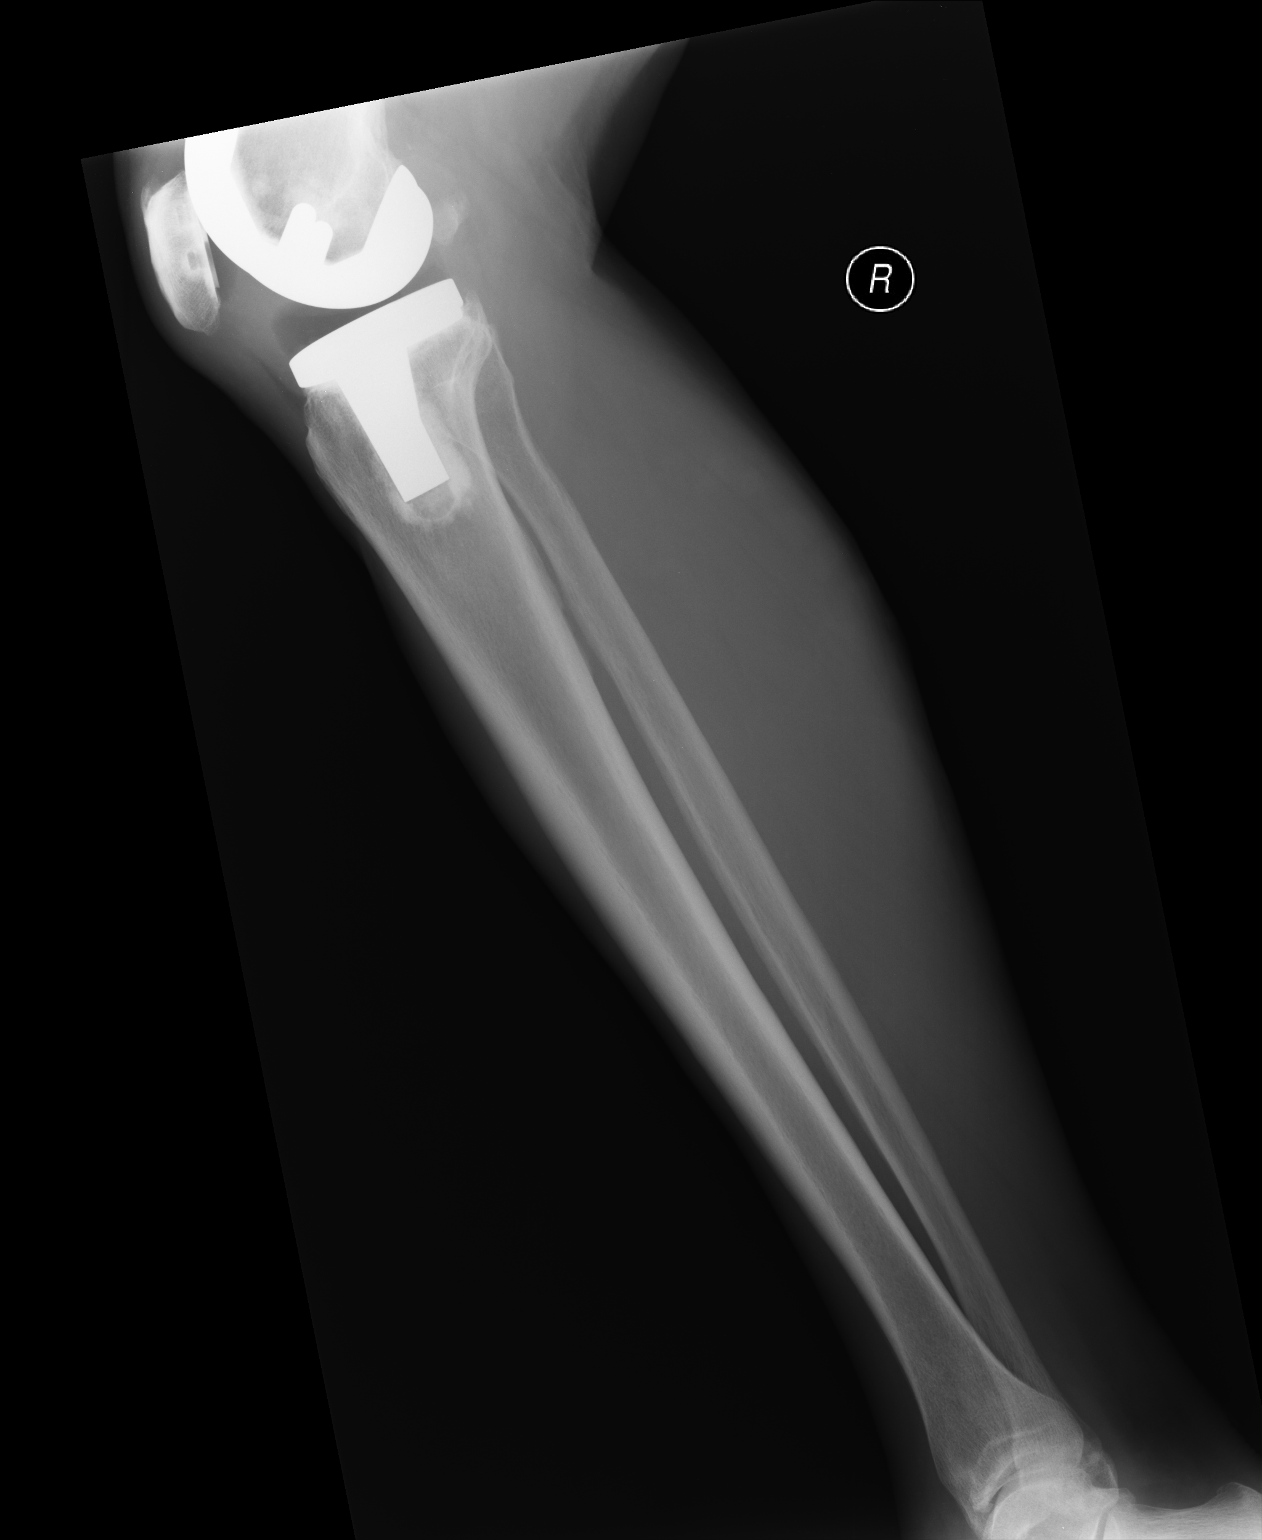

[2 of 2 positions shown; findings below may reference images not displayed]

FINDINGS: There is no evidence of fracture or other focal bone lesions. There
is a right total knee arthroplasty. Soft tissues are unremarkable.
IMPRESSION: No acute osseous injury of the right tibia or fibula.

## 2015-12-06 ENCOUNTER — Ambulatory Visit: Payer: BLUE CROSS/BLUE SHIELD | Attending: Orthopaedic Surgery | Admitting: Physical Therapy

## 2015-12-06 DIAGNOSIS — M25562 Pain in left knee: Secondary | ICD-10-CM | POA: Diagnosis not present

## 2015-12-06 DIAGNOSIS — R600 Localized edema: Secondary | ICD-10-CM | POA: Diagnosis present

## 2015-12-06 DIAGNOSIS — M25662 Stiffness of left knee, not elsewhere classified: Secondary | ICD-10-CM

## 2015-12-06 DIAGNOSIS — R6889 Other general symptoms and signs: Secondary | ICD-10-CM | POA: Insufficient documentation

## 2015-12-06 DIAGNOSIS — M6281 Muscle weakness (generalized): Secondary | ICD-10-CM | POA: Insufficient documentation

## 2015-12-06 NOTE — Therapy (Signed)
[facility] [address] [place], [place], [address] Phone: [phone]   Fax:  [phone]  Physical Therapy Evaluation  Patient Details  Name: Michaela Raymond MRN: [id number] Date of Birth: March 14, 1956 Referring Provider: Dr. Sallyanne Havers  Encounter Date: 12/06/2015      PT End of Session - 12/06/15 1613    Visit Number 1   Date for PT Re-Evaluation 01/31/16   Authorization Type [employer]   PT Start Time 1530   PT Stop Time 1630   PT Time Calculation (min) 60 min   Activity Tolerance Patient tolerated treatment well      Past Medical History  Diagnosis Date  . Dysrhythmia   . Angina November 2011    Normal coronary angiogram; noncardiac symptom  . Heart murmur   . Asthma   . Hypertension   . COPD (chronic obstructive pulmonary disease) ([place])   . Bronchitis   . Pneumonia   . Sleep apnea     cpap machine x 9 years  . Hypothyroidism   . Adenoma     of thyroid gland, benign  . Chronic kidney disease     cyst on right kidney hx of  . Barrett's esophagus   . GERD (gastroesophageal reflux disease)   . Headache(784.0)   . Fibromyalgia   . Arthritis     osteoarthrits  . Depression   . Anxiety   . Diabetes mellitus   . Hyperlipidemia   . Morbid obesity ([place])     Status post gastric bypass surgery September 2013  . Osteoporosis   . Osteopenia     Past Surgical History  Procedure Laterality Date  . Knee arthroplasty      bilaterally  . Back surgery      spinal fusion  . Thyroidectomy    . Rotator cuff repair      2001  . Joint replacement    . Tonsillectomy      and adnoids removed  . Dilation and curettage of uterus    . Cardiac catheterization  05/03/2010    No significant CAD in L coronary system, normal LV function  . Nasal sinus surgery      1989 and mucous cele removed  . Esophagogastroduodenoscopy    . Colonoscopy    . Cardiovascular stress test  04/15/2007    EKG negative for ischemia, no  significant ischemia demonstrated.  . Transthoracic echocardiogram  05/03/2021    EF 55-65%, normal  . Laparoscopic roux-en-y gastric bypass with upper endoscopy and removal of lap band      There were no vitals filed for this visit.       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  2003 had surgery when a "piece popped out."   Had left knee total knee revision of tibial and femoral components 11/18/15.  Had HHPT for 2 weeks "they didn't push me hard enough."  92 degrees of bending,  3 degrees straightening.  Unable to drive since on Dilaudid.  Mother brought her today but she has Alzheimers.    Presents with SPC.     Pertinent History right TKR by Dr. Sallyanne Havers 2012;   Limitations Walking;House hold activities;Standing   How long can you walk comfortably? 15 min?   Diagnostic tests x-rays prior to surgery   Patient Stated  Goals be stronger;  walk 2 miles; go back and swim   Currently in Pain? Yes   Pain Score 4    Pain Location Knee   Pain Orientation Left   Pain Onset 1 to 4 weeks ago   Pain Frequency Constant   Aggravating Factors  standing, walking, straightening my knee out   Pain Relieving Factors ice, taking a shower,  TED hose            [facility] PT Assessment - 12/06/15 0001    Assessment   Medical Diagnosis left TKR   Referring Provider Dr. Sallyanne Havers   Onset Date/Surgical Date 11/18/15   Next MD Visit 01/10/16   Prior Therapy HHPT 2 weeks   Precautions   Precautions None   Restrictions   Weight Bearing Restrictions No   Balance Screen   Has the patient fallen in the past 6 months No   Has the patient had a decrease in activity level because of a fear of falling?  Yes  since TKR and back history   Is the patient reluctant to leave their home because of a fear of falling?  No   Home Environment    Living Environment Private residence   Living Arrangements Alone   Available Help at Discharge Family   Type of [place] to enter   Entrance Stairs-Number of Steps 3   [place] Two level   Alternate Level Stairs-Number of Steps 12  one at a time   [employer] - single point   Prior [place] Unemployed   Leisure hang out with 3 kids and grandkids  photography, cook   Observation/Other Assessments   Focus on Therapeutic Outcomes (FOTO)  53% limitation   Observation/Other Assessments-Edema    Edema Circumferential  left 43 cm mid patella, right 37 cm    AROM   AROM Assessment Site Knee   Right/Left Knee Right;Left   Right Knee Extension 0   Right Knee Flexion 148   Left Knee Extension -8  -15   Left Knee Flexion 90  92   Strength   Strength Assessment Site Knee   Right/Left Knee Right;Left   Right Knee Flexion 5/5   Right Knee Extension 5/5   Left Knee Flexion 3/5   Left Knee Extension 3/5   Ambulation/Gait   Ambulation Distance (Feet) 200 Feet   Assistive device Straight cane   Gait Pattern Decreased hip/knee flexion - left                   [facility] Adult PT Treatment/Exercise - 12/06/15 0001    Vasopneumatic   Number Minutes Vasopneumatic  15 minutes   Vasopnuematic Location  Knee   Vasopneumatic Pressure Medium   Vasopneumatic Temperature  coldest setting                PT Education - 12/06/15 1613    Education provided Yes   Education Details retro stepping;  ROM every 2 hours;  LAQ with ankle pump   Person(s) Educated Patient   Methods Explanation;Demonstration;Handout   Comprehension Verbalized understanding;Returned demonstration          PT Short Term Goals - 12/06/15 1903    PT SHORT TERM GOAL #1   Title The patient will demonstrate compliance with home basic ROM, quad activation, edema management    01/03/16   Time 4   Period Weeks   Status New   PT SHORT TERM GOAL #  2   Title The  patient will have improved left knee flexion to 100 degrees for greater ease with ascending steps to her bedroom   Time 4   Period Weeks   Status New   PT SHORT TERM GOAL #3   Title The patient will be able to ambulate 20-25 min with cane for community mobility   Time 4   Period Weeks   Status New   PT SHORT TERM GOAL #4   Title The patient will have decreased edema with mid patellar circumferential measurement 40cm or less   Time 4   Period Weeks   Status New           PT Long Term Goals - 12/06/15 1906    PT LONG TERM GOAL #1   Title be independent in final HEP for further improvements in strength/ROM   01/31/16   Time 8   Period Weeks   Status New   PT LONG TERM GOAL #2   Title Left knee flexion improved to 110 degrees needed for greater ease with descending steps reciprocally   Time 8   Period Weeks   Status New   PT LONG TERM GOAL #3   Title Left knee extension improved to 3 degrees or less for improved gait effciency to walk  1 mile (patient goal for future is return to 2 miles)   Time 8   Period Weeks   Status New   PT LONG TERM GOAL #4   Title Quad and HS strength to 4/5 needed for standing and walking for future return to work   Time 8   Period Weeks   Status New   PT LONG TERM GOAL #5   Title FOTO functional outcome score improved from 53% to 43% limitation indicating improved function with less pain   Time 8   Period Weeks   Status New               Plan - 12/06/15 1856    Clinical Impression Statement The patient is of low complexity evaluation.  She is s/p left total knee revision on 11/18/15.  She had HHPT for 2 weeks but continues to complain of stiffness, swelling and weakness.   She presents wearing TED compression stocking and is using a single point cane.  Decreased knee extension ROM and stance time during gait.  Moderate edema with left mid patella circumference 6 cm larger than right.  She lives alone in a 2 level home with her bedroom  upstairs (negotiates steps one at a time up and down.)  She has not attempted community ambulation.   Limited knee AROM supine 8-90 degrees, seated 15-92.  Moderate quad lag with SLR;  Quad and HS strength 3/5.  Decreased patellar mobility in all planes.     Rehab Potential Good   PT Frequency 3x / week   PT Duration 8 weeks   PT Treatment/Interventions ADLs/Self Care Home Management;Cryotherapy;Electrical Stimulation;Therapeutic exercise;Manual techniques;Patient/family education;Neuromuscular re-education;Scar mobilization;Taping;Vasopneumatic Device   PT Next Visit Plan Nu-step;  knee flexion and extension ROM;  quad activation;  manual therapy;  vasocompression      Patient will benefit from skilled therapeutic intervention in order to improve the following deficits and impairments:  Abnormal gait, Decreased activity tolerance, Decreased strength, Decreased scar mobility, Decreased range of motion, Difficulty walking, Hypomobility, Increased edema, Pain  Visit Diagnosis: Pain in left knee - Plan: PT plan of care cert/re-cert  Stiffness of left knee, not elsewhere classified - Plan:  PT plan of care cert/re-cert  Muscle weakness (generalized) - Plan: PT plan of care cert/re-cert  Localized edema - Plan: PT plan of care cert/re-cert     Problem List Patient Active Problem List   Diagnosis Date Noted  . Tobacco abuse counseling 07/19/2015  . Essential hypertension 04/02/2013  . Hyperlipidemia   . History of Morbid obesity - status post gastric bypass surgery   . Other abnormal glucose 10/25/2011  . Other malaise and fatigue 10/25/2011  . Myalgia and myositis, unspecified 10/25/2011  . Degeneration of lumbar or lumbosacral intervertebral disc 10/25/2011  . Degeneration of cervical intervertebral disc 10/25/2011  . OSA on CPAP 10/25/2011  . GERD (gastroesophageal reflux disease) 10/25/2011  . IBS (irritable bowel syndrome) 10/25/2011  . COPD (chronic obstructive pulmonary disease)  ([place]) 10/25/2011  . Other affections of shoulder region, not elsewhere classified 06/26/2011  . Post herpetic neuralgia 09/16/2003     Michaela Raymond, PT 12/06/2015 7:21 PM Phone: [phone] Fax: [phone]   Michaela Raymond 12/06/2015, 7:20 PM  [facility] [address] [place], [place], [address] Phone: [phone]   Fax:  [phone]  Name: Michaela Raymond MRN: [id number] Date of Birth: [birth year]

## 2015-12-06 NOTE — Patient Instructions (Signed)
Rayelle Armor PT [facility] [address], [address] [place], [place] [address] Phone # [phone] Fax [phone]    

## 2015-12-07 ENCOUNTER — Ambulatory Visit: Payer: BLUE CROSS/BLUE SHIELD

## 2015-12-07 DIAGNOSIS — R600 Localized edema: Secondary | ICD-10-CM

## 2015-12-07 DIAGNOSIS — M25662 Stiffness of left knee, not elsewhere classified: Secondary | ICD-10-CM

## 2015-12-07 DIAGNOSIS — M25562 Pain in left knee: Secondary | ICD-10-CM

## 2015-12-07 DIAGNOSIS — M6281 Muscle weakness (generalized): Secondary | ICD-10-CM

## 2015-12-07 NOTE — Patient Instructions (Signed)
Scar Massage  Scar massage is done to improve the mobility of scar, decrease scar tissue from building up, reduce adhesions, and prevent Keloids from forming. Start scar massage after scabs have fallen off by themselves and no open areas. The first few weeks after surgery, it is normal for a scar to appear pink or red and slightly raised. Scars can itch or have areas of numbness. Some scars may be sensitive.   Direct Scar massage: after scar is healed, no opening, no scab 1.  Place pads of two fingers together directly on the scar starting at one end of the scar. Move the fingers up and down across the scar holding 5 seconds one direction.  Then go opposite direction hold 5 seconds.  2. Move over to the next section of the scar and repeat.  Work your way along the entire length of the scar.   3. Next make diagonal movements along the scar holding 5 seconds at one direction. 4. Next movement is side to side. 5. Do not rub fingers over the scar.  Instead keep firm pressure and move scar over the tissue it is on top    [facility] [address], [address] [place], [place] [address] Phone # [phone] Fax [phone] 

## 2015-12-07 NOTE — Therapy (Signed)
[facility] [address], [place], [place], [address] Phone: [phone]   Fax:  [phone]  Physical Therapy Treatment  Patient Details  Name: Michaela Raymond MRN: [id number] Date of Birth: 08-May-1956 Referring Provider: Dr. Sallyanne Havers  Encounter Date: 12/07/2015      PT End of Session - 12/07/15 1610    Visit Number 2   Date for PT Re-Evaluation 01/31/16   PT Start Time 1533   PT Stop Time 1617   PT Time Calculation (min) 44 min   Activity Tolerance Patient tolerated treatment well   Behavior During Therapy [facility] for tasks assessed/performed      Past Medical History  Diagnosis Date  . Dysrhythmia   . Angina November 2011    Normal coronary angiogram; noncardiac symptom  . Heart murmur   . Asthma   . Hypertension   . COPD (chronic obstructive pulmonary disease) ([place])   . Bronchitis   . Pneumonia   . Sleep apnea     cpap machine x 9 years  . Hypothyroidism   . Adenoma     of thyroid gland, benign  . Chronic kidney disease     cyst on right kidney hx of  . Barrett's esophagus   . GERD (gastroesophageal reflux disease)   . Headache(784.0)   . Fibromyalgia   . Arthritis     osteoarthrits  . Depression   . Anxiety   . Diabetes mellitus   . Hyperlipidemia   . Morbid obesity ([place])     Status post gastric bypass surgery September 2013  . Osteoporosis   . Osteopenia     Past Surgical History  Procedure Laterality Date  . Knee arthroplasty      bilaterally  . Back surgery      spinal fusion  . Thyroidectomy    . Rotator cuff repair      2001  . Joint replacement    . Tonsillectomy      and adnoids removed  . Dilation and curettage of uterus    . Cardiac catheterization  05/03/2010    No significant CAD in L coronary system, normal LV function  . Nasal sinus surgery      1989 and mucous cele removed  . Esophagogastroduodenoscopy    . Colonoscopy    . Cardiovascular stress test  04/15/2007    EKG negative for ischemia, no significant ischemia demonstrated.  . Transthoracic echocardiogram  05/03/2021    EF 55-65%, normal  . Laparoscopic roux-en-y gastric bypass with upper endoscopy and removal of lap band      There were no vitals filed for this visit.      Subjective Assessment - 12/07/15 1542    Subjective Pt had evaluation yesterday.  I'm really hurting today.  Slept so long last night that missed pain meds.     Currently in Pain? Yes   Pain Score 9    Pain Location Knee   Pain Orientation Left   Pain Descriptors / Indicators Aching;Tightness   Pain Type Surgical pain   Pain Onset 1 to 4 weeks ago   Pain Frequency Constant   Aggravating Factors  standing and walking, straightening my knee out   Pain Relieving Factors ice, taking a shower, TED hose                         [facility] Adult PT Treatment/Exercise - 12/07/15 0001    Exercises   Exercises Knee/Hip  Knee/Hip Exercises: Stretches   Active Hamstring Stretch Left;3 reps;20 seconds   Knee/Hip Exercises: Aerobic   Stationary Bike Level 0 for ROM x 6 minutes   Knee/Hip Exercises: Standing   Heel Raises Both;2 sets;10 reps   Rocker Board 3 minutes   Rebounder weight shifting 3 ways x1 minute each   Knee/Hip Exercises: Seated   Long Arc Quad Strengthening;Left;2 sets;10 reps   Knee/Hip Exercises: Supine   Heel Slides AAROM;Left;2 sets;5 reps   Vasopneumatic   Number Minutes Vasopneumatic  15 minutes   Vasopnuematic Location  Knee   Vasopneumatic Pressure Medium   Vasopneumatic Temperature  3 snowflakes   Manual Therapy   Manual Therapy Edema management;Myofascial release;Soft tissue mobilization;Passive ROM   Manual therapy comments soft tissue to distal quads, scar massage, PROM into flexion and extension                PT Education - 12/07/15 1549    Education provided Yes   Education Details scar massage   Person(s) Educated Patient   Methods Explanation;Handout    Comprehension Verbalized understanding;Returned demonstration          PT Short Term Goals - 12/06/15 1903    PT SHORT TERM GOAL #1   Title The patient will demonstrate compliance with home basic ROM, quad activation, edema management    01/03/16   Time 4   Period Weeks   Status New   PT SHORT TERM GOAL #2   Title The patient will have improved left knee flexion to 100 degrees for greater ease with ascending steps to her bedroom   Time 4   Period Weeks   Status New   PT SHORT TERM GOAL #3   Title The patient will be able to ambulate 20-25 min with cane for community mobility   Time 4   Period Weeks   Status New   PT SHORT TERM GOAL #4   Title The patient will have decreased edema with mid patellar circumferential measurement 40cm or less   Time 4   Period Weeks   Status New           PT Long Term Goals - 12/06/15 1906    PT LONG TERM GOAL #1   Title be independent in final HEP for further improvements in strength/ROM   01/31/16   Time 8   Period Weeks   Status New   PT LONG TERM GOAL #2   Title Left knee flexion improved to 110 degrees needed for greater ease with descending steps reciprocally   Time 8   Period Weeks   Status New   PT LONG TERM GOAL #3   Title Left knee extension improved to 3 degrees or less for improved gait effciency to walk  1 mile (patient goal for future is return to 2 miles)   Time 8   Period Weeks   Status New   PT LONG TERM GOAL #4   Title Quad and HS strength to 4/5 needed for standing and walking for future return to work   Time 8   Period Weeks   Status New   PT LONG TERM GOAL #5   Title FOTO functional outcome score improved from 53% to 43% limitation indicating improved function with less pain   Time 8   Period Weeks   Status New               Plan - 12/07/15 1536    Clinical Impression Statement Pt is s/p  Lt total knee revision surgery performed 11/18/15.  Pt with Lt knee stiffness, edema, weakness and gait abnormality.   Pt with incresaed pain today so limited ability to exercise.  Pt will continue to benefit from skilled PT for Lt knee flexiblity, strength, mobility and edema/pain management.     Rehab Potential Good   PT Frequency 3x / week   PT Duration 8 weeks   PT Treatment/Interventions ADLs/Self Care Home Management;Cryotherapy;Electrical Stimulation;Therapeutic exercise;Manual techniques;Patient/family education;Neuromuscular re-education;Scar mobilization;Taping;Vasopneumatic Device   PT Next Visit Plan Nu-step/bike,   knee flexion and extension ROM;  quad activation;  manual therapy;  vasocompression      Patient will benefit from skilled therapeutic intervention in order to improve the following deficits and impairments:  Abnormal gait, Decreased activity tolerance, Decreased strength, Decreased scar mobility, Decreased range of motion, Difficulty walking, Hypomobility, Increased edema, Pain  Visit Diagnosis: Pain in left knee  Stiffness of left knee, not elsewhere classified  Muscle weakness (generalized)  Localized edema     Problem List Patient Active Problem List   Diagnosis Date Noted  . Tobacco abuse counseling 07/19/2015  . Essential hypertension 04/02/2013  . Hyperlipidemia   . History of Morbid obesity - status post gastric bypass surgery   . Other abnormal glucose 10/25/2011  . Other malaise and fatigue 10/25/2011  . Myalgia and myositis, unspecified 10/25/2011  . Degeneration of lumbar or lumbosacral intervertebral disc 10/25/2011  . Degeneration of cervical intervertebral disc 10/25/2011  . OSA on CPAP 10/25/2011  . GERD (gastroesophageal reflux disease) 10/25/2011  . IBS (irritable bowel syndrome) 10/25/2011  . COPD (chronic obstructive pulmonary disease) ([place]) 10/25/2011  . Other affections of shoulder region, not elsewhere classified 06/26/2011  . Post herpetic neuralgia 09/16/2003     Sigurd Sos, PT 12/07/2015 4:12 PM  [facility] [address] [place], [place], [address] Phone: [phone]   Fax:  [phone]  Name: ISREAL RUNQUIST MRN: [id number] Date of Birth: 09-19-1955

## 2015-12-13 ENCOUNTER — Ambulatory Visit: Payer: BLUE CROSS/BLUE SHIELD

## 2015-12-13 DIAGNOSIS — M25562 Pain in left knee: Secondary | ICD-10-CM

## 2015-12-13 DIAGNOSIS — M25662 Stiffness of left knee, not elsewhere classified: Secondary | ICD-10-CM

## 2015-12-13 DIAGNOSIS — R600 Localized edema: Secondary | ICD-10-CM

## 2015-12-13 DIAGNOSIS — M6281 Muscle weakness (generalized): Secondary | ICD-10-CM

## 2015-12-13 NOTE — Therapy (Signed)
[facility] [address], [place], [place], [address] Phone: [phone]   Fax:  [phone]  Physical Therapy Treatment  Patient Details  Name: Michaela Raymond MRN: [id number] Date of Birth: 11-09-1955 Referring Provider: Dr. Sallyanne Havers  Encounter Date: 12/13/2015      PT End of Session - 12/13/15 1435    Visit Number 3   Date for PT Re-Evaluation 01/31/16   PT Start Time 1408  pt was late   PT Stop Time 1459   PT Time Calculation (min) 51 min   Activity Tolerance Patient tolerated treatment well   Behavior During Therapy [facility] for tasks assessed/performed      Past Medical History  Diagnosis Date  . Dysrhythmia   . Angina November 2011    Normal coronary angiogram; noncardiac symptom  . Heart murmur   . Asthma   . Hypertension   . COPD (chronic obstructive pulmonary disease) ([place])   . Bronchitis   . Pneumonia   . Sleep apnea     cpap machine x 9 years  . Hypothyroidism   . Adenoma     of thyroid gland, benign  . Chronic kidney disease     cyst on right kidney hx of  . Barrett's esophagus   . GERD (gastroesophageal reflux disease)   . Headache(784.0)   . Fibromyalgia   . Arthritis     osteoarthrits  . Depression   . Anxiety   . Diabetes mellitus   . Hyperlipidemia   . Morbid obesity ([place])     Status post gastric bypass surgery September 2013  . Osteoporosis   . Osteopenia     Past Surgical History  Procedure Laterality Date  . Knee arthroplasty      bilaterally  . Back surgery      spinal fusion  . Thyroidectomy    . Rotator cuff repair      2001  . Joint replacement    . Tonsillectomy      and adnoids removed  . Dilation and curettage of uterus    . Cardiac catheterization  05/03/2010    No significant CAD in L coronary system, normal LV function  . Nasal sinus surgery      1989 and mucous cele removed  . Esophagogastroduodenoscopy    . Colonoscopy    . Cardiovascular stress test   04/15/2007    EKG negative for ischemia, no significant ischemia demonstrated.  . Transthoracic echocardiogram  05/03/2021    EF 55-65%, normal  . Laparoscopic roux-en-y gastric bypass with upper endoscopy and removal of lap band      There were no vitals filed for this visit.      Subjective Assessment - 12/13/15 1413    Subjective Swelling in Lt knee is better after getting new compression hose.  I've been up on my leg a lot.     Pertinent History right TKR by Dr. Sallyanne Havers 2012;   Currently in Pain? Yes   Pain Score 6    Pain Location Knee   Pain Orientation Left   Pain Type Surgical pain   Pain Onset 1 to 4 weeks ago   Pain Frequency Constant   Aggravating Factors  standing and walking, AROM of the Lt knee   Pain Relieving Factors ice, shower, TED hose            [facility] PT Assessment - 12/13/15 0001    Observation/Other Assessments-Edema    Edema Circumferential   Circumferential Edema  Circumferential - Left  42 cm    AROM   Right/Left Knee Left   Left Knee Extension -5   Left Knee Flexion 95                     [facility] Adult PT Treatment/Exercise - 12/13/15 0001    Knee/Hip Exercises: Stretches   Active Hamstring Stretch Left;3 reps;20 seconds  using step   Quad Stretch Left;5 reps;10 seconds  using step   Knee/Hip Exercises: Aerobic   Stationary Bike Level 0 for ROM x 6 minutes   Nustep Level 1 x 8 minutes  seat 7, arms 9   Knee/Hip Exercises: Standing   Heel Raises Both;2 sets;10 reps   Forward Step Up Both;2 sets;10 reps;Hand Hold: 2;Step Height: 6"   Rocker Board 3 minutes   Rebounder weight shifting 3 ways x1 minute each   Knee/Hip Exercises: Seated   Long Arc Quad Strengthening;Left;2 sets;10 reps   Vasopneumatic   Number Minutes Vasopneumatic  15 minutes   Vasopnuematic Location  Knee   Vasopneumatic Pressure Medium   Vasopneumatic Temperature  3 snowflakes   Manual Therapy   Manual Therapy --                  PT Short  Term Goals - 12/13/15 1416    PT SHORT TERM GOAL #1   Title The patient will demonstrate compliance with home basic ROM, quad activation, edema management    01/03/16   Time 4   Period Weeks   Status On-going   PT SHORT TERM GOAL #2   Title The patient will have improved left knee flexion to 100 degrees for greater ease with ascending steps to her bedroom   Time 4   Period Weeks   Status On-going  95 degrees   PT SHORT TERM GOAL #3   Title The patient will be able to ambulate 20-25 min with cane for community mobility   Time 4   Period Weeks   Status Achieved  using cart   PT SHORT TERM GOAL #4   Title The patient will have decreased edema with mid patellar circumferential measurement 40cm or less   Time 4   Period Weeks   Status On-going  42 cm           PT Long Term Goals - 12/06/15 1906    PT LONG TERM GOAL #1   Title be independent in final HEP for further improvements in strength/ROM   01/31/16   Time 8   Period Weeks   Status New   PT LONG TERM GOAL #2   Title Left knee flexion improved to 110 degrees needed for greater ease with descending steps reciprocally   Time 8   Period Weeks   Status New   PT LONG TERM GOAL #3   Title Left knee extension improved to 3 degrees or less for improved gait effciency to walk  1 mile (patient goal for future is return to 2 miles)   Time 8   Period Weeks   Status New   PT LONG TERM GOAL #4   Title Quad and HS strength to 4/5 needed for standing and walking for future return to work   Time 8   Period Weeks   Status New   PT LONG TERM GOAL #5   Title FOTO functional outcome score improved from 53% to 43% limitation indicating improved function with less pain   Time 8   Period Weeks  Status New               Plan - 12/13/15 1417    Clinical Impression Statement Pt with 6/10 Lt knee pain due to incresaed activty today.  Pt with continued edema s/p surgery and pt reports that it is better controlled with TED hose.   Pt with limited Lt knee strength and AROM after knee replacement and will benefit from skilled PT for strength, AROM and edema management.     Rehab Potential Good   PT Frequency 3x / week   PT Duration 8 weeks   PT Treatment/Interventions ADLs/Self Care Home Management;Cryotherapy;Electrical Stimulation;Therapeutic exercise;Manual techniques;Patient/family education;Neuromuscular re-education;Scar mobilization;Taping;Vasopneumatic Device   PT Next Visit Plan Nu-step/bike,   knee flexion and extension ROM;  quad activation;  manual therapy;  vasocompression   Consulted and Agree with Plan of Care Patient      Patient will benefit from skilled therapeutic intervention in order to improve the following deficits and impairments:  Abnormal gait, Decreased activity tolerance, Decreased strength, Decreased scar mobility, Decreased range of motion, Difficulty walking, Hypomobility, Increased edema, Pain  Visit Diagnosis: Pain in left knee  Stiffness of left knee, not elsewhere classified  Muscle weakness (generalized)  Localized edema     Problem List Patient Active Problem List   Diagnosis Date Noted  . Tobacco abuse counseling 07/19/2015  . Essential hypertension 04/02/2013  . Hyperlipidemia   . History of Morbid obesity - status post gastric bypass surgery   . Other abnormal glucose 10/25/2011  . Other malaise and fatigue 10/25/2011  . Myalgia and myositis, unspecified 10/25/2011  . Degeneration of lumbar or lumbosacral intervertebral disc 10/25/2011  . Degeneration of cervical intervertebral disc 10/25/2011  . OSA on CPAP 10/25/2011  . GERD (gastroesophageal reflux disease) 10/25/2011  . IBS (irritable bowel syndrome) 10/25/2011  . COPD (chronic obstructive pulmonary disease) ([place]) 10/25/2011  . Other affections of shoulder region, not elsewhere classified 06/26/2011  . Post herpetic neuralgia 09/16/2003    Sigurd Sos, PT 12/13/2015 2:46 PM  [facility] [address], [place], [place], [address] Phone: [phone]   Fax:  [phone]  Name: Michaela Raymond MRN: [id number] Date of Birth: June 13, 1956

## 2015-12-14 ENCOUNTER — Ambulatory Visit: Payer: BLUE CROSS/BLUE SHIELD

## 2015-12-14 DIAGNOSIS — M6281 Muscle weakness (generalized): Secondary | ICD-10-CM

## 2015-12-14 DIAGNOSIS — M25562 Pain in left knee: Secondary | ICD-10-CM

## 2015-12-14 DIAGNOSIS — M25662 Stiffness of left knee, not elsewhere classified: Secondary | ICD-10-CM

## 2015-12-14 DIAGNOSIS — R6889 Other general symptoms and signs: Secondary | ICD-10-CM

## 2015-12-14 DIAGNOSIS — R600 Localized edema: Secondary | ICD-10-CM

## 2015-12-14 NOTE — Therapy (Signed)
[facility] [address], [place], [place], [address] Phone: [phone]   Fax:  [phone]  Physical Therapy Treatment  Patient Details  Name: Michaela Raymond MRN: [id number] Date of Birth: 1956-05-02 Referring Provider: Dr. Sallyanne Havers  Encounter Date: 12/14/2015      PT End of Session - 12/14/15 1437    Visit Number 4   Date for PT Re-Evaluation 01/31/16   PT Start Time 1400   PT Stop Time 1459   PT Time Calculation (min) 59 min   Activity Tolerance Patient tolerated treatment well   Behavior During Therapy [facility] for tasks assessed/performed      Past Medical History  Diagnosis Date  . Dysrhythmia   . Angina November 2011    Normal coronary angiogram; noncardiac symptom  . Heart murmur   . Asthma   . Hypertension   . COPD (chronic obstructive pulmonary disease) ([place])   . Bronchitis   . Pneumonia   . Sleep apnea     cpap machine x 9 years  . Hypothyroidism   . Adenoma     of thyroid gland, benign  . Chronic kidney disease     cyst on right kidney hx of  . Barrett's esophagus   . GERD (gastroesophageal reflux disease)   . Headache(784.0)   . Fibromyalgia   . Arthritis     osteoarthrits  . Depression   . Anxiety   . Diabetes mellitus   . Hyperlipidemia   . Morbid obesity ([place])     Status post gastric bypass surgery September 2013  . Osteoporosis   . Osteopenia     Past Surgical History  Procedure Laterality Date  . Knee arthroplasty      bilaterally  . Back surgery      spinal fusion  . Thyroidectomy    . Rotator cuff repair      2001  . Joint replacement    . Tonsillectomy      and adnoids removed  . Dilation and curettage of uterus    . Cardiac catheterization  05/03/2010    No significant CAD in L coronary system, normal LV function  . Nasal sinus surgery      1989 and mucous cele removed  . Esophagogastroduodenoscopy    . Colonoscopy    . Cardiovascular stress test  04/15/2007    EKG negative for ischemia, no significant ischemia demonstrated.  . Transthoracic echocardiogram  05/03/2021    EF 55-65%, normal  . Laparoscopic roux-en-y gastric bypass with upper endoscopy and removal of lap band      There were no vitals filed for this visit.      Subjective Assessment - 12/14/15 1410    Subjective I slept really well last night.  Didn't have to wake up to take pain meds.     Currently in Pain? Yes   Pain Score 5    Pain Location Knee   Pain Orientation Left   Pain Descriptors / Indicators Aching;Tightness   Pain Type Surgical pain   Pain Onset 1 to 4 weeks ago   Pain Frequency Constant   Aggravating Factors  standing and walking, AROM of Lt knee   Pain Relieving Factors ice, TED hose, staying off of it                         [facility] Adult PT Treatment/Exercise - 12/14/15 0001    Knee/Hip Exercises: Stretches   Active Hamstring  Stretch Left;3 reps;20 seconds  using step   Quad Stretch Left;5 reps;10 seconds  using step   Knee/Hip Exercises: Aerobic   Stationary Bike Level 0 for ROM x 6 minutes   Nustep Level 1 x 8 minutes  seat 7, arms 9   Knee/Hip Exercises: Machines for Strengthening   Total Gym Leg Press bil. 50# 3x10, 20# Lt only 2x10  seat 5   Knee/Hip Exercises: Standing   Heel Raises Both;2 sets;10 reps   Forward Step Up Both;2 sets;10 reps;Hand Hold: 2;Step Height: 6"   Rocker Board 3 minutes   Rebounder weight shifting 3 ways x1 minute each   Knee/Hip Exercises: Seated   Long Arc Quad Strengthening;Left;2 sets;10 reps   Vasopneumatic   Number Minutes Vasopneumatic  15 minutes   Vasopnuematic Location  Knee   Vasopneumatic Pressure Medium   Vasopneumatic Temperature  3 snowflakes                  PT Short Term Goals - 12/13/15 1416    PT SHORT TERM GOAL #1   Title The patient will demonstrate compliance with home basic ROM, quad activation, edema management    01/03/16   Time 4   Period Weeks   Status  On-going   PT SHORT TERM GOAL #2   Title The patient will have improved left knee flexion to 100 degrees for greater ease with ascending steps to her bedroom   Time 4   Period Weeks   Status On-going  95 degrees   PT SHORT TERM GOAL #3   Title The patient will be able to ambulate 20-25 min with cane for community mobility   Time 4   Period Weeks   Status Achieved  using cart   PT SHORT TERM GOAL #4   Title The patient will have decreased edema with mid patellar circumferential measurement 40cm or less   Time 4   Period Weeks   Status On-going  42 cm           PT Long Term Goals - 12/06/15 1906    PT LONG TERM GOAL #1   Title be independent in final HEP for further improvements in strength/ROM   01/31/16   Time 8   Period Weeks   Status New   PT LONG TERM GOAL #2   Title Left knee flexion improved to 110 degrees needed for greater ease with descending steps reciprocally   Time 8   Period Weeks   Status New   PT LONG TERM GOAL #3   Title Left knee extension improved to 3 degrees or less for improved gait effciency to walk  1 mile (patient goal for future is return to 2 miles)   Time 8   Period Weeks   Status New   PT LONG TERM GOAL #4   Title Quad and HS strength to 4/5 needed for standing and walking for future return to work   Time 8   Period Weeks   Status New   PT LONG TERM GOAL #5   Title FOTO functional outcome score improved from 53% to 43% limitation indicating improved function with less pain   Time 8   Period Weeks   Status New               Plan - 12/14/15 1411    Clinical Impression Statement Pt with reduced pain today and reports that she feels that the sweling is less.  Pt with limited Lt knee strength  and AROM after knee replacement.  Pt tolerated increased exercise in the clinic today.  Pt will continue to benefit from skilled PT for strength, ROM, edema and pain management.     Rehab Potential Good   PT Frequency 3x / week   PT Duration 8  weeks   PT Treatment/Interventions ADLs/Self Care Home Management;Cryotherapy;Electrical Stimulation;Therapeutic exercise;Manual techniques;Patient/family education;Neuromuscular re-education;Scar mobilization;Taping;Vasopneumatic Device   PT Next Visit Plan Nu-step/bike,   knee flexion and extension ROM;  quad activation;  manual therapy;  vasocompression   Consulted and Agree with Plan of Care Patient      Patient will benefit from skilled therapeutic intervention in order to improve the following deficits and impairments:  Abnormal gait, Decreased activity tolerance, Decreased strength, Decreased scar mobility, Decreased range of motion, Difficulty walking, Hypomobility, Increased edema, Pain  Visit Diagnosis: Pain in left knee  Stiffness of left knee, not elsewhere classified  Muscle weakness (generalized)  Localized edema  Activity intolerance     Problem List Patient Active Problem List   Diagnosis Date Noted  . Tobacco abuse counseling 07/19/2015  . Essential hypertension 04/02/2013  . Hyperlipidemia   . History of Morbid obesity - status post gastric bypass surgery   . Other abnormal glucose 10/25/2011  . Other malaise and fatigue 10/25/2011  . Myalgia and myositis, unspecified 10/25/2011  . Degeneration of lumbar or lumbosacral intervertebral disc 10/25/2011  . Degeneration of cervical intervertebral disc 10/25/2011  . OSA on CPAP 10/25/2011  . GERD (gastroesophageal reflux disease) 10/25/2011  . IBS (irritable bowel syndrome) 10/25/2011  . COPD (chronic obstructive pulmonary disease) ([place]) 10/25/2011  . Other affections of shoulder region, not elsewhere classified 06/26/2011  . Post herpetic neuralgia 09/16/2003     Sigurd Sos, PT 12/14/2015 2:42 PM  [facility] [address], [place], [place], [address] Phone: [phone]   Fax:  [phone]  Name: OREATHA SUMMERVILLE MRN: [id number] Date of  Birth: Dec 31, 1955

## 2015-12-19 ENCOUNTER — Encounter: Payer: Self-pay | Admitting: Physical Therapy

## 2015-12-19 ENCOUNTER — Ambulatory Visit: Payer: BLUE CROSS/BLUE SHIELD | Attending: Orthopaedic Surgery | Admitting: Physical Therapy

## 2015-12-19 DIAGNOSIS — R600 Localized edema: Secondary | ICD-10-CM

## 2015-12-19 DIAGNOSIS — M6281 Muscle weakness (generalized): Secondary | ICD-10-CM

## 2015-12-19 DIAGNOSIS — M25662 Stiffness of left knee, not elsewhere classified: Secondary | ICD-10-CM | POA: Diagnosis present

## 2015-12-19 DIAGNOSIS — M25562 Pain in left knee: Secondary | ICD-10-CM | POA: Diagnosis present

## 2015-12-19 NOTE — Therapy (Signed)
[facility] [address] [place], [place], [address] Phone: [phone]   Fax:  [phone]  Physical Therapy Treatment  Patient Details  Name: Michaela Raymond MRN: [id number] Date of Birth: 03/26/1956 Referring Provider: Dr. Sallyanne Havers  Encounter Date: 12/19/2015      PT End of Session - 12/19/15 1303    Visit Number 5   Date for PT Re-Evaluation 01/31/16   Authorization Type [employer]   PT Start Time 1230   PT Stop Time 1320   PT Time Calculation (min) 50 min   Activity Tolerance Patient tolerated treatment well   Behavior During Therapy [facility] for tasks assessed/performed      Past Medical History  Diagnosis Date  . Dysrhythmia   . Angina November 2011    Normal coronary angiogram; noncardiac symptom  . Heart murmur   . Asthma   . Hypertension   . COPD (chronic obstructive pulmonary disease) ([place])   . Bronchitis   . Pneumonia   . Sleep apnea     cpap machine x 9 years  . Hypothyroidism   . Adenoma     of thyroid gland, benign  . Chronic kidney disease     cyst on right kidney hx of  . Barrett's esophagus   . GERD (gastroesophageal reflux disease)   . Headache(784.0)   . Fibromyalgia   . Arthritis     osteoarthrits  . Depression   . Anxiety   . Diabetes mellitus   . Hyperlipidemia   . Morbid obesity ([place])     Status post gastric bypass surgery September 2013  . Osteoporosis   . Osteopenia     Past Surgical History  Procedure Laterality Date  . Knee arthroplasty      bilaterally  . Back surgery      spinal fusion  . Thyroidectomy    . Rotator cuff repair      2001  . Joint replacement    . Tonsillectomy      and adnoids removed  . Dilation and curettage of uterus    . Cardiac catheterization  05/03/2010    No significant CAD in L coronary system, normal LV function  . Nasal sinus surgery      1989 and mucous cele removed  . Esophagogastroduodenoscopy    . Colonoscopy    . Cardiovascular  stress test  04/15/2007    EKG negative for ischemia, no significant ischemia demonstrated.  . Transthoracic echocardiogram  05/03/2021    EF 55-65%, normal  . Laparoscopic roux-en-y gastric bypass with upper endoscopy and removal of lap band      There were no vitals filed for this visit.      Subjective Assessment - 12/19/15 1231    Currently in Pain? Yes   Pain Score 6    Pain Location Knee   Pain Orientation Left   Pain Descriptors / Indicators Aching   Pain Onset 1 to 4 weeks ago            [facility] PT Assessment - 12/19/15 0001    AROM   Right/Left Knee Left   Left Knee Extension -3   Left Knee Flexion 100                     [facility] Adult PT Treatment/Exercise - 12/19/15 0001    Knee/Hip Exercises: Stretches   Active Hamstring Stretch Left;3 reps;20 seconds   Quad Stretch Left;3 reps;10 seconds   Knee/Hip Exercises: Aerobic  Stationary Bike level 0 for ROM x 6 minutes   Knee/Hip Exercises: Machines for Strengthening   Total Gym Leg Press --  bilateral 50# 2x15, Lt LE 20# 2 x 10   Knee/Hip Exercises: Standing   Heel Raises Both;2 sets;10 reps   Forward Step Up Both;2 sets;10 reps   Knee/Hip Exercises: Supine   Quad Sets Strengthening;Left;2 sets;10 reps  5 sec hold with tactile cues   Straight Leg Raises Strengthening;Left;2 sets;10 reps   Vasopneumatic   Number Minutes Vasopneumatic  15 minutes   Vasopnuematic Location  Knee   Vasopneumatic Pressure Medium   Vasopneumatic Temperature  3 snowflakes                  PT Short Term Goals - 12/19/15 1304    PT SHORT TERM GOAL #1   Title The patient will demonstrate compliance with home basic ROM, quad activation, edema management    01/03/16   Time 4   Period Weeks   Status Achieved   PT SHORT TERM GOAL #2   Title The patient will have improved left knee flexion to 100 degrees for greater ease with ascending steps to her bedroom   Status Achieved   PT SHORT TERM GOAL #3   Title The  patient will be able to ambulate 20-25 min with cane for community mobility   Status Achieved   PT SHORT TERM GOAL #4   Title The patient will have decreased edema with mid patellar circumferential measurement 40cm or less   Status On-going           PT Long Term Goals - 12/19/15 1305    PT LONG TERM GOAL #1   Title be independent in final HEP for further improvements in strength/ROM   01/31/16   Status On-going   PT LONG TERM GOAL #2   Title Left knee flexion improved to 110 degrees needed for greater ease with descending steps reciprocally   Status On-going   PT LONG TERM GOAL #3   Title Left knee extension improved to 3 degrees or less for improved gait effciency to walk  1 mile (patient goal for future is return to 2 miles)   Status Achieved   PT LONG TERM GOAL #4   Title Quad and HS strength to 4/5 needed for standing and walking for future return to work   Time 8   Period Weeks   Status On-going   PT LONG TERM GOAL #5   Title FOTO functional outcome score improved from 53% to 43% limitation indicating improved function with less pain   Time 8   Period Weeks   Status On-going               Plan - 12/19/15 1303    Clinical Impression Statement Pt reports she feels less swelling and less pain, she continues to ice frequently. Pt sitll with difficulty activating quad, but has improved AROM.   Rehab Potential Good   PT Frequency 3x / week   PT Duration 8 weeks   PT Treatment/Interventions ADLs/Self Care Home Management;Cryotherapy;Electrical Stimulation;Therapeutic exercise;Manual techniques;Patient/family education;Neuromuscular re-education;Scar mobilization;Taping;Vasopneumatic Device   PT Next Visit Plan balance, ROM      Patient will benefit from skilled therapeutic intervention in order to improve the following deficits and impairments:  Abnormal gait, Decreased activity tolerance, Decreased strength, Decreased scar mobility, Decreased range of motion,  Difficulty walking, Hypomobility, Increased edema, Pain  Visit Diagnosis: Pain in left knee  Stiffness of left knee, not  elsewhere classified  Localized edema  Muscle weakness (generalized)     Problem List Patient Active Problem List   Diagnosis Date Noted  . Tobacco abuse counseling 07/19/2015  . Essential hypertension 04/02/2013  . Hyperlipidemia   . History of Morbid obesity - status post gastric bypass surgery   . Other abnormal glucose 10/25/2011  . Other malaise and fatigue 10/25/2011  . Myalgia and myositis, unspecified 10/25/2011  . Degeneration of lumbar or lumbosacral intervertebral disc 10/25/2011  . Degeneration of cervical intervertebral disc 10/25/2011  . OSA on CPAP 10/25/2011  . GERD (gastroesophageal reflux disease) 10/25/2011  . IBS (irritable bowel syndrome) 10/25/2011  . COPD (chronic obstructive pulmonary disease) ([place]) 10/25/2011  . Other affections of shoulder region, not elsewhere classified 06/26/2011  . Post herpetic neuralgia 09/16/2003    Isabelle Course, PT, DPT  12/19/2015, 1:06 PM  [facility] [address], [place], [place], [address] Phone: [phone]   Fax:  [phone]  Name: SHELEEN EMICK MRN: [id number] Date of Birth: [birth year]

## 2015-12-21 ENCOUNTER — Encounter: Payer: Self-pay | Admitting: Physical Therapy

## 2015-12-21 ENCOUNTER — Ambulatory Visit: Payer: BLUE CROSS/BLUE SHIELD | Admitting: Physical Therapy

## 2015-12-21 DIAGNOSIS — M25562 Pain in left knee: Secondary | ICD-10-CM | POA: Diagnosis not present

## 2015-12-21 DIAGNOSIS — R600 Localized edema: Secondary | ICD-10-CM

## 2015-12-21 DIAGNOSIS — M25662 Stiffness of left knee, not elsewhere classified: Secondary | ICD-10-CM

## 2015-12-21 DIAGNOSIS — M6281 Muscle weakness (generalized): Secondary | ICD-10-CM

## 2015-12-21 NOTE — Therapy (Signed)
[facility] [address], [place], [place], [address] Phone: [phone]   Fax:  [phone]  Physical Therapy Treatment  Patient Details  Name: Michaela Raymond MRN: [id number] Date of Birth: Nov 08, 1955 Referring Provider: Dr. Sallyanne Havers  Encounter Date: 12/21/2015      PT End of Session - 12/21/15 1147    Visit Number 6   Date for PT Re-Evaluation 01/31/16   Authorization Type BCBS   PT Start Time 1144   PT Stop Time 1245   PT Time Calculation (min) 61 min   Activity Tolerance Patient tolerated treatment well   Behavior During Therapy [facility] for tasks assessed/performed      Past Medical History  Diagnosis Date  . Dysrhythmia   . Angina November 2011    Normal coronary angiogram; noncardiac symptom  . Heart murmur   . Asthma   . Hypertension   . COPD (chronic obstructive pulmonary disease) ([place])   . Bronchitis   . Pneumonia   . Sleep apnea     cpap machine x 9 years  . Hypothyroidism   . Adenoma     of thyroid gland, benign  . Chronic kidney disease     cyst on right kidney hx of  . Barrett's esophagus   . GERD (gastroesophageal reflux disease)   . Headache(784.0)   . Fibromyalgia   . Arthritis     osteoarthrits  . Depression   . Anxiety   . Diabetes mellitus   . Hyperlipidemia   . Morbid obesity ([place])     Status post gastric bypass surgery September 2013  . Osteoporosis   . Osteopenia     Past Surgical History  Procedure Laterality Date  . Knee arthroplasty      bilaterally  . Back surgery      spinal fusion  . Thyroidectomy    . Rotator cuff repair      2001  . Joint replacement    . Tonsillectomy      and adnoids removed  . Dilation and curettage of uterus    . Cardiac catheterization  05/03/2010    No significant CAD in L coronary system, normal LV function  . Nasal sinus surgery      1989 and mucous cele removed  . Esophagogastroduodenoscopy    . Colonoscopy    . Cardiovascular  stress test  04/15/2007    EKG negative for ischemia, no significant ischemia demonstrated.  . Transthoracic echocardiogram  05/03/2021    EF 55-65%, normal  . Laparoscopic roux-en-y gastric bypass with upper endoscopy and removal of lap band      There were no vitals filed for this visit.      Subjective Assessment - 12/21/15 1146    Subjective Had a lot of pain last night, better currently.    Currently in Pain? Yes   Pain Score 5    Pain Location Knee   Pain Orientation Left   Pain Descriptors / Indicators Dull;Aching   Aggravating Factors  Overdoing activity   Pain Relieving Factors Ice, TED hose, rest   Multiple Pain Sites No                         [facility] Adult PT Treatment/Exercise - 12/21/15 0001    Knee/Hip Exercises: Stretches   Active Hamstring Stretch Left;3 reps;20 seconds   Knee/Hip Exercises: Aerobic   Stationary Bike level 0 for ROM x 6 minutes   Nustep L1  x 6 min   Knee/Hip Exercises: Machines for Strengthening   Total Gym Leg Press Seat #5; Bil 50# 10x  55# 2x 10: LT 30# 2x 10   Knee/Hip Exercises: Standing   Lateral Step Up Left;2 sets;10 reps;Hand Hold: 2;Step Height: 6"   Forward Step Up Both;2 sets;10 reps   Knee/Hip Exercises: Supine   Short Arc Quad Sets Strengthening;Left;2 sets;15 reps  2#   Vasopneumatic   Number Minutes Vasopneumatic  15 minutes   Vasopnuematic Location  Knee   Vasopneumatic Pressure Medium   Vasopneumatic Temperature  3 snowflakes                  PT Short Term Goals - 12/19/15 1304    PT SHORT TERM GOAL #1   Title The patient will demonstrate compliance with home basic ROM, quad activation, edema management    01/03/16   Time 4   Period Weeks   Status Achieved   PT SHORT TERM GOAL #2   Title The patient will have improved left knee flexion to 100 degrees for greater ease with ascending steps to her bedroom   Status Achieved   PT SHORT TERM GOAL #3   Title The patient will be able to ambulate  20-25 min with cane for community mobility   Status Achieved   PT SHORT TERM GOAL #4   Title The patient will have decreased edema with mid patellar circumferential measurement 40cm or less   Status On-going           PT Long Term Goals - 12/19/15 1305    PT LONG TERM GOAL #1   Title be independent in final HEP for further improvements in strength/ROM   01/31/16   Status On-going   PT LONG TERM GOAL #2   Title Left knee flexion improved to 110 degrees needed for greater ease with descending steps reciprocally   Status On-going   PT LONG TERM GOAL #3   Title Left knee extension improved to 3 degrees or less for improved gait effciency to walk  1 mile (patient goal for future is return to 2 miles)   Status Achieved   PT LONG TERM GOAL #4   Title Quad and HS strength to 4/5 needed for standing and walking for future return to work   Time 8   Period Weeks   Status On-going   PT LONG TERM GOAL #5   Title FOTO functional outcome score improved from 53% to 43% limitation indicating improved function with less pain   Time 8   Period Weeks   Status On-going               Plan - 12/21/15 1148    Clinical Impression Statement Tolerated increased weights on leg press today. Pt continues to have better nights despite last night. Over the last few days she reports she is easier to be on her feet longer, needing to ice less frequently.    Rehab Potential Good   PT Frequency 3x / week   PT Duration 8 weeks   PT Treatment/Interventions ADLs/Self Care Home Management;Cryotherapy;Electrical Stimulation;Therapeutic exercise;Manual techniques;Patient/family education;Neuromuscular re-education;Scar mobilization;Taping;Vasopneumatic Device   Consulted and Agree with Plan of Care Patient      Patient will benefit from skilled therapeutic intervention in order to improve the following deficits and impairments:  Abnormal gait, Decreased activity tolerance, Decreased strength, Decreased scar  mobility, Decreased range of motion, Difficulty walking, Hypomobility, Increased edema, Pain  Visit Diagnosis: Pain in left knee  Stiffness of left knee, not elsewhere classified  Localized edema  Muscle weakness (generalized)     Problem List Patient Active Problem List   Diagnosis Date Noted  . Tobacco abuse counseling 07/19/2015  . Essential hypertension 04/02/2013  . Hyperlipidemia   . History of Morbid obesity - status post gastric bypass surgery   . Other abnormal glucose 10/25/2011  . Other malaise and fatigue 10/25/2011  . Myalgia and myositis, unspecified 10/25/2011  . Degeneration of lumbar or lumbosacral intervertebral disc 10/25/2011  . Degeneration of cervical intervertebral disc 10/25/2011  . OSA on CPAP 10/25/2011  . GERD (gastroesophageal reflux disease) 10/25/2011  . IBS (irritable bowel syndrome) 10/25/2011  . COPD (chronic obstructive pulmonary disease) ([place]) 10/25/2011  . Other affections of shoulder region, not elsewhere classified 06/26/2011  . Post herpetic neuralgia 09/16/2003    Michaela Raymond, PTA 12/21/2015, 12:32 PM  [facility] [address] [place], [place], [address] Phone: [phone]   Fax:  [phone]  Name: Michaela Raymond MRN: [id number] Date of Birth: 1955-07-12

## 2015-12-23 ENCOUNTER — Ambulatory Visit: Payer: BLUE CROSS/BLUE SHIELD | Admitting: Physical Therapy

## 2015-12-23 DIAGNOSIS — M6281 Muscle weakness (generalized): Secondary | ICD-10-CM

## 2015-12-23 DIAGNOSIS — R600 Localized edema: Secondary | ICD-10-CM

## 2015-12-23 DIAGNOSIS — M25562 Pain in left knee: Secondary | ICD-10-CM | POA: Diagnosis not present

## 2015-12-23 DIAGNOSIS — M25662 Stiffness of left knee, not elsewhere classified: Secondary | ICD-10-CM

## 2015-12-23 NOTE — Therapy (Signed)
[facility] [address] [place], [place], [address] Phone: [phone]   Fax:  [phone]  Physical Therapy Treatment  Patient Details  Name: Michaela Raymond MRN: [id number] Date of Birth: 07-Mar-1956 Referring Provider: Dr. Sallyanne Havers  Encounter Date: 12/23/2015      PT End of Session - 12/23/15 1049    Visit Number 7   Date for PT Re-Evaluation 01/31/16   Authorization Type BCBS   PT Start Time 1028   PT Stop Time 1115   PT Time Calculation (min) 47 min   Activity Tolerance Patient limited by pain      Past Medical History  Diagnosis Date  . Dysrhythmia   . Angina November 2011    Normal coronary angiogram; noncardiac symptom  . Heart murmur   . Asthma   . Hypertension   . COPD (chronic obstructive pulmonary disease) ([place])   . Bronchitis   . Pneumonia   . Sleep apnea     cpap machine x 9 years  . Hypothyroidism   . Adenoma     of thyroid gland, benign  . Chronic kidney disease     cyst on right kidney hx of  . Barrett's esophagus   . GERD (gastroesophageal reflux disease)   . Headache(784.0)   . Fibromyalgia   . Arthritis     osteoarthrits  . Depression   . Anxiety   . Diabetes mellitus   . Hyperlipidemia   . Morbid obesity ([place])     Status post gastric bypass surgery September 2013  . Osteoporosis   . Osteopenia     Past Surgical History  Procedure Laterality Date  . Knee arthroplasty      bilaterally  . Back surgery      spinal fusion  . Thyroidectomy    . Rotator cuff repair      2001  . Joint replacement    . Tonsillectomy      and adnoids removed  . Dilation and curettage of uterus    . Cardiac catheterization  05/03/2010    No significant CAD in L coronary system, normal LV function  . Nasal sinus surgery      1989 and mucous cele removed  . Esophagogastroduodenoscopy    . Colonoscopy    . Cardiovascular stress test  04/15/2007    EKG negative for ischemia, no significant  ischemia demonstrated.  . Transthoracic echocardiogram  05/03/2021    EF 55-65%, normal  . Laparoscopic roux-en-y gastric bypass with upper endoscopy and removal of lap band      There were no vitals filed for this visit.      Subjective Assessment - 12/23/15 1029    Subjective Patient arrives 13 min late.  I think we did too much last visit with increasing weight on the leg press and adding a new exercise.   I was in a lot more pain yesterday as a result.  I took my pain medicine at 8 this morning.     Currently in Pain? Yes   Pain Score 7    Pain Location Knee   Pain Orientation Left            [facility] PT Assessment - 12/23/15 0001    AROM   Left Knee Extension -5   Left Knee Flexion 102  supine                     [facility] Adult PT Treatment/Exercise - 12/23/15 0001  Knee/Hip Exercises: Aerobic   Nustep L1 x 6 min   Knee/Hip Exercises: Seated   Long Arc Quad Strengthening;Left;2 sets;10 reps   Hamstring Curl Strengthening;Left;10 reps   Knee/Hip Exercises: Supine   Other Supine Knee/Hip Exercises supine ball roll for knee flexion 10x   Other Supine Knee/Hip Exercises HS sets on ball 10x   Vasopneumatic   Number Minutes Vasopneumatic  15 minutes   Vasopnuematic Location  Knee   Vasopneumatic Pressure Medium   Vasopneumatic Temperature  3 snowflakes   Manual Therapy   Manual Therapy Joint mobilization;Soft tissue mobilization   Joint Mobilization knee flexion and extension mobs in supine and sitting grade 3    Soft tissue mobilization quads and HS                  PT Short Term Goals - 12/23/15 1154    PT SHORT TERM GOAL #1   Title The patient will demonstrate compliance with home basic ROM, quad activation, edema management    01/03/16   Status Achieved   PT SHORT TERM GOAL #2   Title The patient will have improved left knee flexion to 100 degrees for greater ease with ascending steps to her bedroom   Status Achieved   PT SHORT TERM GOAL  #3   Title The patient will be able to ambulate 20-25 min with cane for community mobility   Status Achieved   PT SHORT TERM GOAL #4   Title The patient will have decreased edema with mid patellar circumferential measurement 40cm or less   Time 4   Period Weeks   Status On-going           PT Long Term Goals - 12/23/15 1155    PT LONG TERM GOAL #1   Title be independent in final HEP for further improvements in strength/ROM   01/31/16   Time 8   Period Weeks   Status On-going   PT LONG TERM GOAL #2   Title Left knee flexion improved to 110 degrees needed for greater ease with descending steps reciprocally   Time 8   Period Weeks   Status On-going   PT LONG TERM GOAL #3   Title Left knee extension improved to 3 degrees or less for improved gait effciency to walk  1 mile (patient goal for future is return to 2 miles)   Status Achieved   PT LONG TERM GOAL #4   Title Quad and HS strength to 4/5 needed for standing and walking for future return to work   Time 8   Period Weeks   Status On-going   PT LONG TERM GOAL #5   Title FOTO functional outcome score improved from 53% to 43% limitation indicating improved function with less pain   Time 8   Period Weeks   Status On-going               Plan - 12/23/15 1102    Clinical Impression Statement The patient arrives 13 min late.  She reports an exacerbation of pain after last visit.  Exercises modified significantly secondary to patient's complaints of pain.  Knee flexion AROM improving as well as passive knee extension.  Active knee extension limited by quad lag (decreased quad muscle activation).  Will  progress therapy slowly secondary to pain sensitivity.    PT Next Visit Plan slow progression of knee ROM; strengthening;  manual therapy;  vasocompression for edema control  ;  recheck mid patellar circumference  Patient will benefit from skilled therapeutic intervention in order to improve the following deficits and  impairments:     Visit Diagnosis: Pain in left knee  Stiffness of left knee, not elsewhere classified  Localized edema  Muscle weakness (generalized)     Problem List Patient Active Problem List   Diagnosis Date Noted  . Tobacco abuse counseling 07/19/2015  . Essential hypertension 04/02/2013  . Hyperlipidemia   . History of Morbid obesity - status post gastric bypass surgery   . Other abnormal glucose 10/25/2011  . Other malaise and fatigue 10/25/2011  . Myalgia and myositis, unspecified 10/25/2011  . Degeneration of lumbar or lumbosacral intervertebral disc 10/25/2011  . Degeneration of cervical intervertebral disc 10/25/2011  . OSA on CPAP 10/25/2011  . GERD (gastroesophageal reflux disease) 10/25/2011  . IBS (irritable bowel syndrome) 10/25/2011  . COPD (chronic obstructive pulmonary disease) ([place]) 10/25/2011  . Other affections of shoulder region, not elsewhere classified 06/26/2011  . Post herpetic neuralgia 09/16/2003    Ruben Im, PT 12/23/2015 11:57 AM Phone: [phone] Fax: [phone]  Alvera Singh 12/23/2015, 11:57 AM  [facility] [address] [place], [place], [address] Phone: [phone]   Fax:  [phone]  Name: TALISA PIZZITOLA MRN: [id number] Date of Birth: 1956-02-27

## 2015-12-27 ENCOUNTER — Ambulatory Visit: Payer: BLUE CROSS/BLUE SHIELD | Admitting: Physical Therapy

## 2015-12-27 DIAGNOSIS — M25562 Pain in left knee: Secondary | ICD-10-CM

## 2015-12-27 DIAGNOSIS — M6281 Muscle weakness (generalized): Secondary | ICD-10-CM

## 2015-12-27 DIAGNOSIS — R600 Localized edema: Secondary | ICD-10-CM

## 2015-12-27 DIAGNOSIS — M25662 Stiffness of left knee, not elsewhere classified: Secondary | ICD-10-CM

## 2015-12-27 NOTE — Therapy (Signed)
[facility] [address] [place], [place], [address] Phone: [phone]   Fax:  [phone]  Physical Therapy Treatment  Patient Details  Name: Michaela Raymond MRN: [id number] Date of Birth: Oct 23, 1955 Referring Provider: Dr. Sallyanne Havers  Encounter Date: 12/27/2015      PT End of Session - 12/27/15 1114    Visit Number 8   Date for PT Re-Evaluation 01/31/16   Authorization Type BCBS   PT Start Time 1101   PT Stop Time 1200   PT Time Calculation (min) 59 min   Activity Tolerance Patient tolerated treatment well      Past Medical History  Diagnosis Date  . Dysrhythmia   . Angina November 2011    Normal coronary angiogram; noncardiac symptom  . Heart murmur   . Asthma   . Hypertension   . COPD (chronic obstructive pulmonary disease) ([place])   . Bronchitis   . Pneumonia   . Sleep apnea     cpap machine x 9 years  . Hypothyroidism   . Adenoma     of thyroid gland, benign  . Chronic kidney disease     cyst on right kidney hx of  . Barrett's esophagus   . GERD (gastroesophageal reflux disease)   . Headache(784.0)   . Fibromyalgia   . Arthritis     osteoarthrits  . Depression   . Anxiety   . Diabetes mellitus   . Hyperlipidemia   . Morbid obesity ([place])     Status post gastric bypass surgery September 2013  . Osteoporosis   . Osteopenia     Past Surgical History  Procedure Laterality Date  . Knee arthroplasty      bilaterally  . Back surgery      spinal fusion  . Thyroidectomy    . Rotator cuff repair      2001  . Joint replacement    . Tonsillectomy      and adnoids removed  . Dilation and curettage of uterus    . Cardiac catheterization  05/03/2010    No significant CAD in L coronary system, normal LV function  . Nasal sinus surgery      1989 and mucous cele removed  . Esophagogastroduodenoscopy    . Colonoscopy    . Cardiovascular stress test  04/15/2007    EKG negative for ischemia, no  significant ischemia demonstrated.  . Transthoracic echocardiogram  05/03/2021    EF 55-65%, normal  . Laparoscopic roux-en-y gastric bypass with upper endoscopy and removal of lap band      There were no vitals filed for this visit.      Subjective Assessment - 12/27/15 1059    Subjective This morning I'm feeling better but yesterday I had more pain because I had to take the trash cans to the curb downhill.  Did better after last session since did a lighter workout.  Sleeping better.  Got a new prescription for pain pills.  Sees Dr. Sallyanne Havers 7/25.   Pertinent History right TKR by Dr. Sallyanne Havers 2012;   Currently in Pain? Yes   Pain Score 5    Pain Location Knee   Pain Orientation Left   Pain Type Surgical pain   Pain Onset More than a month ago   Pain Frequency Constant                         [facility] Adult PT Treatment/Exercise - 12/27/15 0001  Knee/Hip Exercises: Aerobic   Stationary Bike level 0 for ROM x 6 minutes   Nustep L2 x 6 min   Knee/Hip Exercises: Machines for Strengthening   Total Gym Leg Press seat 6 Bilateral 50# 10x; left only 30# 10x   Knee/Hip Exercises: Standing   Lateral Step Up Left;1 set;10 reps;Hand Hold: 1;Step Height: 4"   Forward Step Up Left;1 set;10 reps;Hand Hold: 1;Step Height: 4"   Knee/Hip Exercises: Seated   Long Arc Quad Strengthening;Left;15 reps   Long Arc Quad Weight 3 lbs.   Knee/Hip Exercises: Supine   Other Supine Knee/Hip Exercises supine ball roll for knee flexion 10x   Other Supine Knee/Hip Exercises HS sets on ball 10x   Vasopneumatic   Number Minutes Vasopneumatic  15 minutes   Vasopnuematic Location  Knee   Vasopneumatic Pressure High   Vasopneumatic Temperature  3 snowflakes   Manual Therapy   Joint Mobilization knee flexion and extension mobs in supine and sitting grade 3    Soft tissue mobilization quads and HS                  PT Short Term Goals - 12/27/15 1140    PT SHORT TERM GOAL #1   Title  The patient will demonstrate compliance with home basic ROM, quad activation, edema management    01/03/16   Status Achieved   PT SHORT TERM GOAL #2   Title The patient will have improved left knee flexion to 100 degrees for greater ease with ascending steps to her bedroom   Status Achieved   PT SHORT TERM GOAL #3   Title The patient will be able to ambulate 20-25 min with cane for community mobility   Status Achieved   PT SHORT TERM GOAL #4   Title The patient will have decreased edema with mid patellar circumferential measurement 40cm or less   Time 4   Period Weeks   Status On-going           PT Long Term Goals - 12/27/15 1141    PT LONG TERM GOAL #1   Title be independent in final HEP for further improvements in strength/ROM   01/31/16   Time 8   Period Weeks   Status On-going   PT LONG TERM GOAL #2   Title Left knee flexion improved to 110 degrees needed for greater ease with descending steps reciprocally   Time 8   Period Weeks   Status On-going   PT LONG TERM GOAL #3   Title Left knee extension improved to 3 degrees or less for improved gait effciency to walk  1 mile (patient goal for future is return to 2 miles)   Status Achieved   PT LONG TERM GOAL #4   Title Quad and HS strength to 4/5 needed for standing and walking for future return to work   Time 8   Period Weeks   Status On-going   PT LONG TERM GOAL #5   Title FOTO functional outcome score improved from 53% to 43% limitation indicating improved function with less pain   Time 8   Period Weeks   Status On-going               Plan - 12/27/15 1131    Clinical Impression Statement The patient is able to participate in a progression of exercises from last visit, but still low reps and intensity, without an exacerbation of pain intensity.  Improved quad activation and decreased limp noted.  Decreasing  edema.  Therapist closely monitoring pain and modifying as needed.     PT Next Visit Plan slow progression  of knee ROM secondary to pain sensitivity; strengthening;  manual therapy;  vasocompression for edema control; recheck knee AROM;    recheck mid patellar circumference      Patient will benefit from skilled therapeutic intervention in order to improve the following deficits and impairments:     Visit Diagnosis: Pain in left knee  Stiffness of left knee, not elsewhere classified  Localized edema  Muscle weakness (generalized)     Problem List Patient Active Problem List   Diagnosis Date Noted  . Tobacco abuse counseling 07/19/2015  . Essential hypertension 04/02/2013  . Hyperlipidemia   . History of Morbid obesity - status post gastric bypass surgery   . Other abnormal glucose 10/25/2011  . Other malaise and fatigue 10/25/2011  . Myalgia and myositis, unspecified 10/25/2011  . Degeneration of lumbar or lumbosacral intervertebral disc 10/25/2011  . Degeneration of cervical intervertebral disc 10/25/2011  . OSA on CPAP 10/25/2011  . GERD (gastroesophageal reflux disease) 10/25/2011  . IBS (irritable bowel syndrome) 10/25/2011  . COPD (chronic obstructive pulmonary disease) ([place]) 10/25/2011  . Other affections of shoulder region, not elsewhere classified 06/26/2011  . Post herpetic neuralgia 09/16/2003    Ruben Im, PT 12/27/2015 11:43 AM Phone: [phone] Fax: [phone] Alvera Singh 12/27/2015, 11:42 AM  [facility] [address], [place], [place], [address] Phone: [phone]   Fax:  [phone]  Name: Michaela Raymond MRN: [id number] Date of Birth: 31-Mar-1956

## 2015-12-29 ENCOUNTER — Ambulatory Visit: Payer: BLUE CROSS/BLUE SHIELD | Admitting: Physical Therapy

## 2015-12-29 DIAGNOSIS — R600 Localized edema: Secondary | ICD-10-CM

## 2015-12-29 DIAGNOSIS — M25662 Stiffness of left knee, not elsewhere classified: Secondary | ICD-10-CM

## 2015-12-29 DIAGNOSIS — M25562 Pain in left knee: Secondary | ICD-10-CM

## 2015-12-29 DIAGNOSIS — M6281 Muscle weakness (generalized): Secondary | ICD-10-CM

## 2015-12-29 NOTE — Therapy (Signed)
[facility] [address] [place], [place], [address] Phone: [phone]   Fax:  [phone]  Physical Therapy Treatment  Patient Details  Name: Michaela Raymond MRN: [id number] Date of Birth: March 04, 1956 Referring Provider: Dr. Sallyanne Havers  Encounter Date: 12/29/2015      PT End of Session - 12/29/15 1124    Visit Number 9   Date for PT Re-Evaluation 01/31/16   Authorization Type BCBS   PT Start Time 1105   PT Stop Time 1200   PT Time Calculation (min) 55 min   Activity Tolerance Patient tolerated treatment well      Past Medical History  Diagnosis Date  . Dysrhythmia   . Angina November 2011    Normal coronary angiogram; noncardiac symptom  . Heart murmur   . Asthma   . Hypertension   . COPD (chronic obstructive pulmonary disease) ([place])   . Bronchitis   . Pneumonia   . Sleep apnea     cpap machine x 9 years  . Hypothyroidism   . Adenoma     of thyroid gland, benign  . Chronic kidney disease     cyst on right kidney hx of  . Barrett's esophagus   . GERD (gastroesophageal reflux disease)   . Headache(784.0)   . Fibromyalgia   . Arthritis     osteoarthrits  . Depression   . Anxiety   . Diabetes mellitus   . Hyperlipidemia   . Morbid obesity ([place])     Status post gastric bypass surgery September 2013  . Osteoporosis   . Osteopenia     Past Surgical History  Procedure Laterality Date  . Knee arthroplasty      bilaterally  . Back surgery      spinal fusion  . Thyroidectomy    . Rotator cuff repair      2001  . Joint replacement    . Tonsillectomy      and adnoids removed  . Dilation and curettage of uterus    . Cardiac catheterization  05/03/2010    No significant CAD in L coronary system, normal LV function  . Nasal sinus surgery      1989 and mucous cele removed  . Esophagogastroduodenoscopy    . Colonoscopy    . Cardiovascular stress test  04/15/2007    EKG negative for ischemia, no  significant ischemia demonstrated.  . Transthoracic echocardiogram  05/03/2021    EF 55-65%, normal  . Laparoscopic roux-en-y gastric bypass with upper endoscopy and removal of lap band      There were no vitals filed for this visit.      Subjective Assessment - 12/29/15 1118    Subjective I slept for 3 nights in a row and feeling better.     Currently in Pain? Yes   Pain Score 5    Pain Location Knee   Pain Orientation Left   Pain Type Surgical pain   Aggravating Factors  overdoing activity   Pain Relieving Factors Ice/compression, TED hose, rest            [facility] PT Assessment - 12/29/15 0001    Circumferential Edema   Circumferential - Left  41cm   AROM   Left Knee Extension -5   Left Knee Flexion 110                     [facility] Adult PT Treatment/Exercise - 12/29/15 0001    Knee/Hip Exercises: Stretches  Active Hamstring Stretch Left;3 reps;20 seconds  on steps   Quad Stretch Left;30 seconds  on steps   Knee/Hip Exercises: Aerobic   Stationary Bike level 0 for ROM x 6 minutes  full revolutions   Nustep L2 x 6 min   Knee/Hip Exercises: Machines for Strengthening   Total Gym Leg Press seat 5 50# B 2x10; left 30# 2x10   Knee/Hip Exercises: Standing   Lateral Step Up Left;1 set;Hand Hold: 1;Step Height: 4";15 reps   Forward Step Up Left;1 set;15 reps;Hand Hold: 1;Step Height: 4"   Knee/Hip Exercises: Seated   Other Seated Knee/Hip Exercises seated heel lifts off stool 12x   Vasopneumatic   Number Minutes Vasopneumatic  15 minutes   Vasopnuematic Location  Knee   Vasopneumatic Pressure High   Vasopneumatic Temperature  3 snowflakes   Manual Therapy   Joint Mobilization knee flexion and extension mobs in supine and sitting grade 3    Soft tissue mobilization quads and HS                  PT Short Term Goals - 12/29/15 1128    PT SHORT TERM GOAL #1   Title The patient will demonstrate compliance with home basic ROM, quad activation,  edema management    01/03/16   Status Achieved   PT SHORT TERM GOAL #2   Title The patient will have improved left knee flexion to 100 degrees for greater ease with ascending steps to her bedroom   Status Achieved   PT SHORT TERM GOAL #3   Title The patient will be able to ambulate 20-25 min with cane for community mobility   Status Achieved   PT SHORT TERM GOAL #4   Title The patient will have decreased edema with mid patellar circumferential measurement 40cm or less   Baseline 41 cm 7/13   Time 4   Period Weeks   Status Partially Met           PT Long Term Goals - 12/29/15 1145    PT LONG TERM GOAL #1   Title be independent in final HEP for further improvements in strength/ROM   01/31/16   Time 8   Period Weeks   Status On-going   PT LONG TERM GOAL #2   Title Left knee flexion improved to 110 degrees needed for greater ease with descending steps reciprocally   Baseline 110 degrees 7/13 but unable to descend reciprocally   Time 8   Period Weeks   Status Partially Met   PT LONG TERM GOAL #3   Title Left knee extension improved to 3 degrees or less for improved gait effciency to walk  1 mile (patient goal for future is return to 2 miles)   Status Achieved   PT LONG TERM GOAL #4   Title Quad and HS strength to 4/5 needed for standing and walking for future return to work   Time 8   Period Weeks   Status On-going   PT LONG TERM GOAL #5   Title FOTO functional outcome score improved from 53% to 43% limitation indicating improved function with less pain   Time 8   Period Weeks   Status On-going               Plan - 12/29/15 1125    Clinical Impression Statement Patient with improved pain sensitivity and improving knee ROM.  Able to make a full revolution on the bike for the first time today.  Improving quad  muscle activation but lacks eccentric control for stepping down from a step.  Able to go up steps reciprocally now.  Therapist closely monitoring response and  cautioning against overdoing it.     PT Next Visit Plan slow progression of knee ROM secondary to pain sensitivity; strengthening;  manual therapy;  vasocompression for edema control; 10th visit FOTO      Patient will benefit from skilled therapeutic intervention in order to improve the following deficits and impairments:     Visit Diagnosis: Pain in left knee  Stiffness of left knee, not elsewhere classified  Localized edema  Muscle weakness (generalized)     Problem List Patient Active Problem List   Diagnosis Date Noted  . Tobacco abuse counseling 07/19/2015  . Essential hypertension 04/02/2013  . Hyperlipidemia   . History of Morbid obesity - status post gastric bypass surgery   . Other abnormal glucose 10/25/2011  . Other malaise and fatigue 10/25/2011  . Myalgia and myositis, unspecified 10/25/2011  . Degeneration of lumbar or lumbosacral intervertebral disc 10/25/2011  . Degeneration of cervical intervertebral disc 10/25/2011  . OSA on CPAP 10/25/2011  . GERD (gastroesophageal reflux disease) 10/25/2011  . IBS (irritable bowel syndrome) 10/25/2011  . COPD (chronic obstructive pulmonary disease) ([place]) 10/25/2011  . Other affections of shoulder region, not elsewhere classified 06/26/2011  . Post herpetic neuralgia 09/16/2003   Ruben Im, PT 12/29/2015 12:26 PM Phone: [phone] Fax: [phone]  Alvera Singh 12/29/2015, 12:26 PM  [facility] [address], [place], [place], [address] Phone: [phone]   Fax:  [phone]  Name: Michaela Raymond MRN: [id number] Date of Birth: 12-Nov-1955

## 2016-01-03 ENCOUNTER — Ambulatory Visit: Payer: BLUE CROSS/BLUE SHIELD | Admitting: Physical Therapy

## 2016-01-03 DIAGNOSIS — R600 Localized edema: Secondary | ICD-10-CM

## 2016-01-03 DIAGNOSIS — M25662 Stiffness of left knee, not elsewhere classified: Secondary | ICD-10-CM

## 2016-01-03 DIAGNOSIS — M25562 Pain in left knee: Secondary | ICD-10-CM

## 2016-01-03 DIAGNOSIS — M6281 Muscle weakness (generalized): Secondary | ICD-10-CM

## 2016-01-03 NOTE — Therapy (Signed)
[facility] [address], [place], [place], [address] Phone: [phone]   Fax:  [phone]  Physical Therapy Treatment  Patient Details  Name: Michaela Raymond MRN: [id number] Date of Birth: 09-23-1955 Referring Provider: Dr. Sallyanne Havers  Encounter Date: 01/03/2016      PT End of Session - 01/03/16 1225    Visit Number 10   Date for PT Re-Evaluation 01/31/16   Authorization Type BCBS   PT Start Time 1146   PT Stop Time 1245   PT Time Calculation (min) 59 min   Activity Tolerance Patient tolerated treatment well      Past Medical History  Diagnosis Date  . Dysrhythmia   . Angina November 2011    Normal coronary angiogram; noncardiac symptom  . Heart murmur   . Asthma   . Hypertension   . COPD (chronic obstructive pulmonary disease) ([place])   . Bronchitis   . Pneumonia   . Sleep apnea     cpap machine x 9 years  . Hypothyroidism   . Adenoma     of thyroid gland, benign  . Chronic kidney disease     cyst on right kidney hx of  . Barrett's esophagus   . GERD (gastroesophageal reflux disease)   . Headache(784.0)   . Fibromyalgia   . Arthritis     osteoarthrits  . Depression   . Anxiety   . Diabetes mellitus   . Hyperlipidemia   . Morbid obesity ([place])     Status post gastric bypass surgery September 2013  . Osteoporosis   . Osteopenia     Past Surgical History  Procedure Laterality Date  . Knee arthroplasty      bilaterally  . Back surgery      spinal fusion  . Thyroidectomy    . Rotator cuff repair      2001  . Joint replacement    . Tonsillectomy      and adnoids removed  . Dilation and curettage of uterus    . Cardiac catheterization  05/03/2010    No significant CAD in L coronary system, normal LV function  . Nasal sinus surgery      1989 and mucous cele removed  . Esophagogastroduodenoscopy    . Colonoscopy    . Cardiovascular stress test  04/15/2007    EKG negative for ischemia, no  significant ischemia demonstrated.  . Transthoracic echocardiogram  05/03/2021    EF 55-65%, normal  . Laparoscopic roux-en-y gastric bypass with upper endoscopy and removal of lap band      There were no vitals filed for this visit.      Subjective Assessment - 01/03/16 1203    Subjective Patient states I'm ready to push it.  I'm feeling good today.   Currently in Pain? Yes   Pain Score 4    Pain Location Knee   Pain Orientation Left            [facility] PT Assessment - 01/03/16 0001    Observation/Other Assessments   Focus on Therapeutic Outcomes (FOTO)  46% limitation   Strength   Left Knee Flexion 4-/5   Left Knee Extension 4-/5                     [facility] Adult PT Treatment/Exercise - 01/03/16 0001    Knee/Hip Exercises: Aerobic   Stationary Bike level 0 for ROM x 6 minutes  full revolutions   Nustep L4-6 6 min  Knee/Hip Exercises: Machines for Strengthening   Total Gym Leg Press seat 6 60#; left 40# 20x each   Knee/Hip Exercises: Standing   Forward Step Up Left;1 set;10 reps;Hand Hold: 1;Step Height: 6"   Forward Step Up Limitations right touch chair   Step Down Left;1 set;10 reps;Hand Hold: 1;Step Height: 2"   SLS with Vectors stand on green foarm 4 ways 5x   Knee/Hip Exercises: Seated   Long Arc Quad Strengthening;Left;20 reps;Weights   Long Arc Quad Weight 3 lbs.   Heel Slides Strengthening;Left;1 set;20 reps   Vasopneumatic   Number Minutes Vasopneumatic  15 minutes   Vasopnuematic Location  Knee   Vasopneumatic Pressure High   Vasopneumatic Temperature  3 snowflakes   Manual Therapy   Joint Mobilization knee flexion and extension mobs in supine and sitting grade 3    Soft tissue mobilization quads and HS                  PT Short Term Goals - 01/03/16 1848    PT SHORT TERM GOAL #1   Title The patient will demonstrate compliance with home basic ROM, quad activation, edema management    01/03/16   Status Achieved   PT SHORT TERM  GOAL #2   Title The patient will have improved left knee flexion to 100 degrees for greater ease with ascending steps to her bedroom   Status Achieved   PT SHORT TERM GOAL #3   Title The patient will be able to ambulate 20-25 min with cane for community mobility   Status Achieved   PT SHORT TERM GOAL #4   Title The patient will have decreased edema with mid patellar circumferential measurement 40cm or less   Time 4   Period Weeks   Status Partially Met           PT Long Term Goals - 01/03/16 1849    PT LONG TERM GOAL #1   Title be independent in final HEP for further improvements in strength/ROM   01/31/16   Time 8   Period Weeks   Status On-going   PT LONG TERM GOAL #2   Title Left knee flexion improved to 110 degrees needed for greater ease with descending steps reciprocally   Time 8   Period Weeks   Status Partially Met   PT LONG TERM GOAL #3   Title Left knee extension improved to 3 degrees or less for improved gait effciency to walk  1 mile (patient goal for future is return to 2 miles)   Status Achieved   PT LONG TERM GOAL #4   Title Quad and HS strength to 4/5 needed for standing and walking for future return to work   Time 8   Period Weeks   Status On-going   PT LONG TERM GOAL #5   Title FOTO functional outcome score improved from 53% to 43% limitation indicating improved function with less pain   Time 8   Period Weeks   Status On-going               Plan - 01/03/16 1844    Clinical Impression Statement Patient expresses readiness to progress exercises and repetitions or resistance increased but with close monitoring of pain response secondary to recent exacerbation of pain secondary to "overdoing it."  Her AROM continues to improved as well as continued edema reduction with vasocompression as well as patient's excellent compliance with wearing compression garments.  She continues to lack good quad motor control  making it very difficult to descend steps.   Good improvement in FOTO functional outcome score from 53% limitation to 46%.     PT Next Visit Plan  progression of knee ROM;  quad strengthening;  manual therapy;  vasocompression for edema control;  recheck knee ROM      Patient will benefit from skilled therapeutic intervention in order to improve the following deficits and impairments:     Visit Diagnosis: Pain in left knee  Stiffness of left knee, not elsewhere classified  Localized edema  Muscle weakness (generalized)     Problem List Patient Active Problem List   Diagnosis Date Noted  . Tobacco abuse counseling 07/19/2015  . Essential hypertension 04/02/2013  . Hyperlipidemia   . History of Morbid obesity - status post gastric bypass surgery   . Other abnormal glucose 10/25/2011  . Other malaise and fatigue 10/25/2011  . Myalgia and myositis, unspecified 10/25/2011  . Degeneration of lumbar or lumbosacral intervertebral disc 10/25/2011  . Degeneration of cervical intervertebral disc 10/25/2011  . OSA on CPAP 10/25/2011  . GERD (gastroesophageal reflux disease) 10/25/2011  . IBS (irritable bowel syndrome) 10/25/2011  . COPD (chronic obstructive pulmonary disease) ([place]) 10/25/2011  . Other affections of shoulder region, not elsewhere classified 06/26/2011  . Post herpetic neuralgia 09/16/2003   Ruben Im, PT 01/03/2016 6:50 PM Phone: [phone] Fax: [phone]  Alvera Singh 01/03/2016, 6:50 PM  [facility] [address], [place], [place], [address] Phone: [phone]   Fax:  [phone]  Name: Michaela Raymond MRN: [id number] Date of Birth: Nov 11, 1955

## 2016-01-05 ENCOUNTER — Ambulatory Visit: Payer: BLUE CROSS/BLUE SHIELD | Admitting: Physical Therapy

## 2016-01-05 DIAGNOSIS — M6281 Muscle weakness (generalized): Secondary | ICD-10-CM

## 2016-01-05 DIAGNOSIS — R600 Localized edema: Secondary | ICD-10-CM

## 2016-01-05 DIAGNOSIS — M25562 Pain in left knee: Secondary | ICD-10-CM | POA: Diagnosis not present

## 2016-01-05 DIAGNOSIS — M25662 Stiffness of left knee, not elsewhere classified: Secondary | ICD-10-CM

## 2016-01-05 NOTE — Therapy (Signed)
[facility] [address] [place], [place], [address] Phone: [phone]   Fax:  [phone]  Physical Therapy Treatment  Patient Details  Name: Michaela Raymond MRN: [id number] Date of Birth: [birth year] Referring Provider: Dr. Sallyanne Havers  Encounter Date: 01/05/2016      PT End of Session - 01/05/16 1220    Visit Number 11   Date for PT Re-Evaluation 01/31/16   PT Start Time 1145   PT Stop Time 1235   PT Time Calculation (min) 50 min   Activity Tolerance Patient tolerated treatment well   Behavior During Therapy [facility] for tasks assessed/performed      Past Medical History  Diagnosis Date  . Dysrhythmia   . Angina November 2011    Normal coronary angiogram; noncardiac symptom  . Heart murmur   . Asthma   . Hypertension   . COPD (chronic obstructive pulmonary disease) ([place])   . Bronchitis   . Pneumonia   . Sleep apnea     cpap machine x 9 years  . Hypothyroidism   . Adenoma     of thyroid gland, benign  . Chronic kidney disease     cyst on right kidney hx of  . Barrett's esophagus   . GERD (gastroesophageal reflux disease)   . Headache(784.0)   . Fibromyalgia   . Arthritis     osteoarthrits  . Depression   . Anxiety   . Diabetes mellitus   . Hyperlipidemia   . Morbid obesity ([place])     Status post gastric bypass surgery September 2013  . Osteoporosis   . Osteopenia     Past Surgical History  Procedure Laterality Date  . Knee arthroplasty      bilaterally  . Back surgery      spinal fusion  . Thyroidectomy    . Rotator cuff repair      2001  . Joint replacement    . Tonsillectomy      and adnoids removed  . Dilation and curettage of uterus    . Cardiac catheterization  05/03/2010    No significant CAD in L coronary system, normal LV function  . Nasal sinus surgery      1989 and mucous cele removed  . Esophagogastroduodenoscopy    . Colonoscopy    . Cardiovascular stress test  04/15/2007     EKG negative for ischemia, no significant ischemia demonstrated.  . Transthoracic echocardiogram  05/03/2021    EF 55-65%, normal  . Laparoscopic roux-en-y gastric bypass with upper endoscopy and removal of lap band      There were no vitals filed for this visit.      Subjective Assessment - 01/05/16 1150    Subjective I'm feeling good today   Pertinent History right TKR by Dr. Sallyanne Havers 2012;   Patient Stated Goals be stronger;  walk 2 miles; go back and swim   Currently in Pain? Yes   Pain Score 6    Pain Location Knee   Pain Orientation Left   Pain Descriptors / Indicators Aching   Pain Type Surgical pain   Pain Onset More than a month ago                         [facility] Adult PT Treatment/Exercise - 01/05/16 0001    Knee/Hip Exercises: Aerobic   Stationary Bike level 0 for ROM x 6 minutes   Knee/Hip Exercises: Machines for Strengthening   Total  Gym Leg Press bilat 60#, left 40# both 20x   Knee/Hip Exercises: Standing   Lateral Step Up Left;2 sets;10 reps;Hand Hold: 1   Forward Step Up Left;2 sets;10 reps;Hand Hold: 1   Step Down --  attempted-limited by pain   SLS with Vectors standing on green foam 4 ways x 10   Vasopneumatic   Number Minutes Vasopneumatic  15 minutes   Vasopnuematic Location  Knee   Vasopneumatic Pressure High   Vasopneumatic Temperature  3 flakes   Manual Therapy   Joint Mobilization knee flex and extend mobs in supine grade 3                  PT Short Term Goals - 01/05/16 1205    PT SHORT TERM GOAL #1   Title The patient will demonstrate compliance with home basic ROM, quad activation, edema management    01/03/16   Status Achieved   PT SHORT TERM GOAL #2   Title The patient will have improved left knee flexion to 100 degrees for greater ease with ascending steps to her bedroom   Status Achieved   PT SHORT TERM GOAL #3   Title The patient will be able to ambulate 20-25 min with cane for community mobility    Status Achieved   PT SHORT TERM GOAL #4   Title The patient will have decreased edema with mid patellar circumferential measurement 40cm or less   Baseline 40cm 7/20   Status Achieved           PT Long Term Goals - 01/05/16 1222    PT LONG TERM GOAL #1   Title be independent in final HEP for further improvements in strength/ROM   01/31/16   Status On-going   PT LONG TERM GOAL #2   Title Left knee flexion improved to 110 degrees needed for greater ease with descending steps reciprocally   Status Partially Met   PT LONG TERM GOAL #3   Title Left knee extension improved to 3 degrees or less for improved gait effciency to walk  1 mile (patient goal for future is return to 2 miles)   Status Achieved   PT LONG TERM GOAL #4   Title Quad and HS strength to 4/5 needed for standing and walking for future return to work   Status On-going   PT LONG TERM GOAL #5   Title FOTO functional outcome score improved from 53% to 43% limitation indicating improved function with less pain   Status On-going               Plan - 01/05/16 1222    Consulted and Agree with Plan of Care Patient      Patient will benefit from skilled therapeutic intervention in order to improve the following deficits and impairments:  Abnormal gait, Decreased activity tolerance, Decreased strength, Decreased scar mobility, Decreased range of motion, Difficulty walking, Hypomobility, Increased edema, Pain  Visit Diagnosis: Pain in left knee  Stiffness of left knee, not elsewhere classified  Localized edema  Muscle weakness (generalized)     Problem List Patient Active Problem List   Diagnosis Date Noted  . Tobacco abuse counseling 07/19/2015  . Essential hypertension 04/02/2013  . Hyperlipidemia   . History of Morbid obesity - status post gastric bypass surgery   . Other abnormal glucose 10/25/2011  . Other malaise and fatigue 10/25/2011  . Myalgia and myositis, unspecified 10/25/2011  . Degeneration  of lumbar or lumbosacral intervertebral disc 10/25/2011  . Degeneration  of cervical intervertebral disc 10/25/2011  . OSA on CPAP 10/25/2011  . GERD (gastroesophageal reflux disease) 10/25/2011  . IBS (irritable bowel syndrome) 10/25/2011  . COPD (chronic obstructive pulmonary disease) ([place]) 10/25/2011  . Other affections of shoulder region, not elsewhere classified 06/26/2011  . Post herpetic neuralgia 09/16/2003    Isabelle Course, PT, DPT  01/05/2016, 12:24 PM  [facility] [address] [place], [place], [address] Phone: [phone]   Fax:  [phone]  Name: ROGAN ECKLUND MRN: [id number] Date of Birth: Oct 25, 1955

## 2016-01-09 ENCOUNTER — Ambulatory Visit: Payer: BLUE CROSS/BLUE SHIELD

## 2016-01-09 DIAGNOSIS — M25562 Pain in left knee: Secondary | ICD-10-CM | POA: Diagnosis not present

## 2016-01-09 DIAGNOSIS — R600 Localized edema: Secondary | ICD-10-CM

## 2016-01-09 DIAGNOSIS — M25662 Stiffness of left knee, not elsewhere classified: Secondary | ICD-10-CM

## 2016-01-09 DIAGNOSIS — M6281 Muscle weakness (generalized): Secondary | ICD-10-CM

## 2016-01-09 NOTE — Therapy (Signed)
[facility] [address] [place], [place], [address] Phone: [phone]   Fax:  [phone]  Physical Therapy Treatment  Patient Details  Name: Michaela Raymond MRN: [id number] Date of Birth: December 05, 1955 Referring Provider: Dr. Sallyanne Havers  Encounter Date: 01/09/2016      PT End of Session - 01/09/16 1220    Visit Number 12   Date for PT Re-Evaluation 01/31/16   Authorization Type BCBS   PT Start Time 1146   PT Stop Time 1243   PT Time Calculation (min) 57 min   Activity Tolerance Patient tolerated treatment well   Behavior During Therapy [facility] for tasks assessed/performed      Past Medical History:  Diagnosis Date  . Adenoma    of thyroid gland, benign  . Angina November 2011   Normal coronary angiogram; noncardiac symptom  . Anxiety   . Arthritis    osteoarthrits  . Asthma   . Barrett's esophagus   . Bronchitis   . Chronic kidney disease    cyst on right kidney hx of  . COPD (chronic obstructive pulmonary disease) ([place])   . Depression   . Diabetes mellitus   . Dysrhythmia   . Fibromyalgia   . GERD (gastroesophageal reflux disease)   . Headache(784.0)   . Heart murmur   . Hyperlipidemia   . Hypertension   . Hypothyroidism   . Morbid obesity ([place])    Status post gastric bypass surgery September 2013  . Osteopenia   . Osteoporosis   . Pneumonia   . Sleep apnea    cpap machine x 9 years    Past Surgical History:  Procedure Laterality Date  . BACK SURGERY     spinal fusion  . CARDIAC CATHETERIZATION  05/03/2010   No significant CAD in L coronary system, normal LV function  . CARDIOVASCULAR STRESS TEST  04/15/2007   EKG negative for ischemia, no significant ischemia demonstrated.  . COLONOSCOPY    . DILATION AND CURETTAGE OF UTERUS    . ESOPHAGOGASTRODUODENOSCOPY    . JOINT REPLACEMENT    . KNEE ARTHROPLASTY     bilaterally  . LAPAROSCOPIC ROUX-EN-Y GASTRIC BYPASS WITH UPPER ENDOSCOPY AND  REMOVAL OF LAP BAND    . NASAL SINUS SURGERY     1989 and mucous cele removed  . ROTATOR CUFF REPAIR     2001  . THYROIDECTOMY    . TONSILLECTOMY     and adnoids removed  . TRANSTHORACIC ECHOCARDIOGRAM  05/03/2021   EF 55-65%, normal    There were no vitals filed for this visit.      Subjective Assessment - 01/09/16 1152    Subjective Going to see MD tomorrow.  Able to go up and down the steps without as much pain.     Patient Stated Goals be stronger;  walk 2 miles; go back and swim   Currently in Pain? Yes   Pain Score 3    Pain Location Knee   Pain Orientation Left   Pain Descriptors / Indicators Aching;Tightness   Pain Type Surgical pain   Pain Onset More than a month ago   Pain Frequency Constant   Aggravating Factors  steps, overdoing it   Pain Relieving Factors ice, compression, TED hose, rest, pain meds only as needed.              [facility] PT Assessment - 01/09/16 0001      Assessment   Medical Diagnosis left TKR  Prior Function   Vocation Unemployed     Circumferential Edema   Circumferential - Left  41     AROM   Left Knee Extension -5   Left Knee Flexion 110     Strength   Left Knee Flexion 4/5   Left Knee Extension 4/5                     [facility] Adult PT Treatment/Exercise - 01/09/16 0001      Knee/Hip Exercises: Aerobic   Stationary Bike level 2 x 8 minutes     Knee/Hip Exercises: Machines for Strengthening   Total Gym Leg Press bilat 60# 2x20, left 40# both 2x20  seat 5     Knee/Hip Exercises: Standing   Lateral Step Up Left;2 sets;10 reps;Hand Hold: 1   Forward Step Up Left;2 sets;10 reps;Hand Hold: 1;Step Height: 6"   Step Down Left;Step Height: 6";2 sets;20 reps;Hand Hold: 2   Walking with Sports Cord 20# forward x 10     Knee/Hip Exercises: Seated   Long Arc Quad Strengthening;Left;20 reps;Weights   Long Arc Quad Weight 3 lbs.   Heel Slides Strengthening;Left;1 set;20 reps     Vasopneumatic   Number Minutes  Vasopneumatic  15 minutes   Vasopnuematic Location  Knee   Vasopneumatic Pressure High   Vasopneumatic Temperature  3 flakes                  PT Short Term Goals - 01/09/16 1153      PT SHORT TERM GOAL #1   Title The patient will demonstrate compliance with home basic ROM, quad activation, edema management    01/03/16   Status Achieved     PT SHORT TERM GOAL #2   Title The patient will have improved left knee flexion to 100 degrees for greater ease with ascending steps to her bedroom   Status Achieved     PT SHORT TERM GOAL #3   Title The patient will be able to ambulate 20-25 min with cane for community mobility   Status Achieved     PT SHORT TERM GOAL #4   Title The patient will have decreased edema with mid patellar circumferential measurement 40cm or less   Status On-going           PT Long Term Goals - 01/09/16 1154      PT LONG TERM GOAL #1   Title be independent in final HEP for further improvements in strength/ROM   01/31/16   Time 8   Period Weeks   Status On-going     PT LONG TERM GOAL #2   Title Left knee flexion improved to 110 degrees needed for greater ease with descending steps reciprocally   Status Achieved     PT LONG TERM GOAL #3   Title Left knee extension improved to 3 degrees or less for improved gait effciency to walk  1 mile (patient goal for future is return to 2 miles)   Time 8   Period Weeks   Status On-going     PT LONG TERM GOAL #4   Title Quad and HS strength to 4/5 needed for standing and walking for future return to work   Status Achieved     PT LONG TERM GOAL #5   Title FOTO functional outcome score improved from 53% to 43% limitation indicating improved function with less pain   Time 8   Period Weeks   Status On-going  Plan - 01/09/16 1205    Clinical Impression Statement Pt is making steady progress. Pt able to negotiate steps with reciprocal motion with use of rail now.  Pt demonstrates  moderate quad instability with this activity.  Lt knee AROM is -5 to 110 and gait remains antalgic due to lacking extension.  Pt is able to stand and walk for 40 minutes.  Pt will continue to benefit from skilled PT for Lt knee strength, stability, AROM, gait and edema management s/p TKA to allow for return to normal function.     Rehab Potential Good   PT Frequency 3x / week   PT Duration 8 weeks   PT Treatment/Interventions ADLs/Self Care Home Management;Cryotherapy;Electrical Stimulation;Therapeutic exercise;Manual techniques;Patient/family education;Neuromuscular re-education;Scar mobilization;Taping;Vasopneumatic Device   PT Next Visit Plan See what  MD says.  Lt knee AROM, strength, stabilization, edema management   Consulted and Agree with Plan of Care Patient      Patient will benefit from skilled therapeutic intervention in order to improve the following deficits and impairments:  Abnormal gait, Decreased activity tolerance, Decreased strength, Decreased scar mobility, Decreased range of motion, Difficulty walking, Hypomobility, Increased edema, Pain  Visit Diagnosis: Pain in left knee  Stiffness of left knee, not elsewhere classified  Localized edema  Muscle weakness (generalized)     Problem List Patient Active Problem List   Diagnosis Date Noted  . Tobacco abuse counseling 07/19/2015  . Essential hypertension 04/02/2013  . Hyperlipidemia   . History of Morbid obesity - status post gastric bypass surgery   . Other abnormal glucose 10/25/2011  . Other malaise and fatigue 10/25/2011  . Myalgia and myositis, unspecified 10/25/2011  . Degeneration of lumbar or lumbosacral intervertebral disc 10/25/2011  . Degeneration of cervical intervertebral disc 10/25/2011  . OSA on CPAP 10/25/2011  . GERD (gastroesophageal reflux disease) 10/25/2011  . IBS (irritable bowel syndrome) 10/25/2011  . COPD (chronic obstructive pulmonary disease) ([place]) 10/25/2011  . Other affections of  shoulder region, not elsewhere classified 06/26/2011  . Post herpetic neuralgia 09/16/2003     Sigurd Sos, PT 01/09/16 12:23 PM  [facility] [address], [place], [place], [address] Phone: [phone]   Fax:  [phone]  Name: TYJA MONTAS MRN: [id number] Date of Birth: 02-08-1956

## 2016-01-11 ENCOUNTER — Ambulatory Visit: Payer: BLUE CROSS/BLUE SHIELD

## 2016-01-11 DIAGNOSIS — M25662 Stiffness of left knee, not elsewhere classified: Secondary | ICD-10-CM

## 2016-01-11 DIAGNOSIS — M6281 Muscle weakness (generalized): Secondary | ICD-10-CM

## 2016-01-11 DIAGNOSIS — M25562 Pain in left knee: Secondary | ICD-10-CM

## 2016-01-11 DIAGNOSIS — R600 Localized edema: Secondary | ICD-10-CM

## 2016-01-11 NOTE — Therapy (Signed)
[facility] [address] [place], [place], [address] Phone: [phone]   Fax:  [phone]  Physical Therapy Treatment  Patient Details  Name: Michaela Raymond MRN: [id number] Date of Birth: [birth year] Referring Provider: Dr. Sallyanne Havers  Encounter Date: 01/11/2016      PT End of Session - 01/11/16 1225    Visit Number 13   Date for PT Re-Evaluation 01/31/16   PT Start Time 1148   PT Stop Time 1244   PT Time Calculation (min) 56 min   Activity Tolerance Patient tolerated treatment well   Behavior During Therapy [facility] for tasks assessed/performed      Past Medical History:  Diagnosis Date  . Adenoma    of thyroid gland, benign  . Angina November 2011   Normal coronary angiogram; noncardiac symptom  . Anxiety   . Arthritis    osteoarthrits  . Asthma   . Barrett's esophagus   . Bronchitis   . Chronic kidney disease    cyst on right kidney hx of  . COPD (chronic obstructive pulmonary disease) ([place])   . Depression   . Diabetes mellitus   . Dysrhythmia   . Fibromyalgia   . GERD (gastroesophageal reflux disease)   . Headache(784.0)   . Heart murmur   . Hyperlipidemia   . Hypertension   . Hypothyroidism   . Morbid obesity ([place])    Status post gastric bypass surgery September 2013  . Osteopenia   . Osteoporosis   . Pneumonia   . Sleep apnea    cpap machine x 9 years    Past Surgical History:  Procedure Laterality Date  . BACK SURGERY     spinal fusion  . CARDIAC CATHETERIZATION  05/03/2010   No significant CAD in L coronary system, normal LV function  . CARDIOVASCULAR STRESS TEST  04/15/2007   EKG negative for ischemia, no significant ischemia demonstrated.  . COLONOSCOPY    . DILATION AND CURETTAGE OF UTERUS    . ESOPHAGOGASTRODUODENOSCOPY    . JOINT REPLACEMENT    . KNEE ARTHROPLASTY     bilaterally  . LAPAROSCOPIC ROUX-EN-Y GASTRIC BYPASS WITH UPPER ENDOSCOPY AND REMOVAL OF LAP BAND    . NASAL  SINUS SURGERY     1989 and mucous cele removed  . ROTATOR CUFF REPAIR     2001  . THYROIDECTOMY    . TONSILLECTOMY     and adnoids removed  . TRANSTHORACIC ECHOCARDIOGRAM  05/03/2021   EF 55-65%, normal    There were no vitals filed for this visit.      Subjective Assessment - 01/11/16 1159    Subjective Saw MD yesterday.  He was pleased with progress.   Pertinent History right TKR by Dr. Sallyanne Havers 2012;   Currently in Pain? Yes   Pain Score 6    Pain Location Knee   Pain Orientation Left   Pain Descriptors / Indicators Aching;Tightness   Pain Type Surgical pain   Pain Onset More than a month ago   Pain Frequency Constant   Aggravating Factors  steps, overdoing it   Pain Relieving Factors ice, compression, TED hose, rest, pain meds only as needed.                          [facility] Adult PT Treatment/Exercise - 01/11/16 0001      Knee/Hip Exercises: Aerobic   Stationary Bike level 2 x 8 minutes   Elliptical Ramp 5,  level 2x 5 minutes     Knee/Hip Exercises: Machines for Strengthening   Total Gym Leg Press bilat 60# 2x20, left 40# both 2x20  seat 5     Knee/Hip Exercises: Standing   Lateral Step Up Left;2 sets;10 reps;Hand Hold: 1   Forward Step Up Left;2 sets;10 reps;Hand Hold: 1;Step Height: 6"   Step Down Left;Step Height: 6";2 sets;20 reps;Hand Hold: 2   Rocker Board 3 minutes   Walking with Sports Cord --     Knee/Hip Exercises: Seated   Long Arc Quad --   Long Arc [employer] --     Vasopneumatic   Number Minutes Vasopneumatic  15 minutes   Vasopnuematic Location  Knee   Vasopneumatic Pressure High   Vasopneumatic Temperature  3 flakes                  PT Short Term Goals - 01/09/16 1153      PT SHORT TERM GOAL #1   Title The patient will demonstrate compliance with home basic ROM, quad activation, edema management    01/03/16   Status Achieved     PT SHORT TERM GOAL #2   Title The patient will have improved left knee flexion  to 100 degrees for greater ease with ascending steps to her bedroom   Status Achieved     PT SHORT TERM GOAL #3   Title The patient will be able to ambulate 20-25 min with cane for community mobility   Status Achieved     PT SHORT TERM GOAL #4   Title The patient will have decreased edema with mid patellar circumferential measurement 40cm or less   Status On-going           PT Long Term Goals - 01/09/16 1154      PT LONG TERM GOAL #1   Title be independent in final HEP for further improvements in strength/ROM   01/31/16   Time 8   Period Weeks   Status On-going     PT LONG TERM GOAL #2   Title Left knee flexion improved to 110 degrees needed for greater ease with descending steps reciprocally   Status Achieved     PT LONG TERM GOAL #3   Title Left knee extension improved to 3 degrees or less for improved gait effciency to walk  1 mile (patient goal for future is return to 2 miles)   Time 8   Period Weeks   Status On-going     PT LONG TERM GOAL #4   Title Quad and HS strength to 4/5 needed for standing and walking for future return to work   Status Achieved     PT LONG TERM GOAL #5   Title FOTO functional outcome score improved from 53% to 43% limitation indicating improved function with less pain   Time 8   Period Weeks   Status On-going               Plan - 01/11/16 1156    Clinical Impression Statement Pt continues to make steady progress.  Pt is able to negotiate steps with reciprocal motion with use of rail.  Pt demonstrates moderate quad instability with this activity.  Lt knee AROM is -5-110 and gait remains antalgic due to lacking extension.  Pt tolerated addition of elliptical today.   Pt will continue to benefit from skilled PT for Lt knee strength, stability, AROM, gait and edema management s/p TKA to allow for return to normal function.  Rehab Potential Good   PT Frequency 3x / week   PT Duration 8 weeks   PT Treatment/Interventions ADLs/Self  Care Home Management;Cryotherapy;Electrical Stimulation;Therapeutic exercise;Manual techniques;Patient/family education;Neuromuscular re-education;Scar mobilization;Taping;Vasopneumatic Device   PT Next Visit Plan Lt knee AROM, strength, stabilization, edema management   Consulted and Agree with Plan of Care Patient      Patient will benefit from skilled therapeutic intervention in order to improve the following deficits and impairments:  Abnormal gait, Decreased activity tolerance, Decreased strength, Decreased scar mobility, Decreased range of motion, Difficulty walking, Hypomobility, Increased edema, Pain  Visit Diagnosis: Pain in left knee  Stiffness of left knee, not elsewhere classified  Localized edema  Muscle weakness (generalized)     Problem List Patient Active Problem List   Diagnosis Date Noted  . Tobacco abuse counseling 07/19/2015  . Essential hypertension 04/02/2013  . Hyperlipidemia   . History of Morbid obesity - status post gastric bypass surgery   . Other abnormal glucose 10/25/2011  . Other malaise and fatigue 10/25/2011  . Myalgia and myositis, unspecified 10/25/2011  . Degeneration of lumbar or lumbosacral intervertebral disc 10/25/2011  . Degeneration of cervical intervertebral disc 10/25/2011  . OSA on CPAP 10/25/2011  . GERD (gastroesophageal reflux disease) 10/25/2011  . IBS (irritable bowel syndrome) 10/25/2011  . COPD (chronic obstructive pulmonary disease) ([place]) 10/25/2011  . Other affections of shoulder region, not elsewhere classified 06/26/2011  . Post herpetic neuralgia 09/16/2003     Sigurd Sos, PT 01/11/16 12:26 PM  [facility] [address], [place], [place], [address] Phone: [phone]   Fax:  [phone]  Name: Michaela Raymond MRN: [id number] Date of Birth: 25-Aug-1955

## 2016-01-16 ENCOUNTER — Encounter: Payer: Self-pay | Admitting: Physical Therapy

## 2016-01-16 ENCOUNTER — Ambulatory Visit: Payer: BLUE CROSS/BLUE SHIELD | Admitting: Physical Therapy

## 2016-01-16 DIAGNOSIS — M25562 Pain in left knee: Secondary | ICD-10-CM

## 2016-01-16 DIAGNOSIS — M6281 Muscle weakness (generalized): Secondary | ICD-10-CM

## 2016-01-16 DIAGNOSIS — M25662 Stiffness of left knee, not elsewhere classified: Secondary | ICD-10-CM

## 2016-01-16 DIAGNOSIS — R600 Localized edema: Secondary | ICD-10-CM

## 2016-01-16 NOTE — Therapy (Signed)
[facility] [address], [place], [place], [address] Phone: [phone]   Fax:  [phone]  Physical Therapy Treatment  Patient Details  Name: Michaela Raymond MRN: [id number] Date of Birth: [birth year] Referring Provider: Dr. Sallyanne Havers  Encounter Date: 01/16/2016      PT End of Session - 01/16/16 1133    Visit Number 14   Date for PT Re-Evaluation 01/31/16   Authorization Type BCBS   PT Start Time 1100   PT Stop Time 1150   PT Time Calculation (min) 50 min      Past Medical History:  Diagnosis Date  . Adenoma    of thyroid gland, benign  . Angina November 2011   Normal coronary angiogram; noncardiac symptom  . Anxiety   . Arthritis    osteoarthrits  . Asthma   . Barrett's esophagus   . Bronchitis   . Chronic kidney disease    cyst on right kidney hx of  . COPD (chronic obstructive pulmonary disease) ([place])   . Depression   . Diabetes mellitus   . Dysrhythmia   . Fibromyalgia   . GERD (gastroesophageal reflux disease)   . Headache(784.0)   . Heart murmur   . Hyperlipidemia   . Hypertension   . Hypothyroidism   . Morbid obesity ([place])    Status post gastric bypass surgery September 2013  . Osteopenia   . Osteoporosis   . Pneumonia   . Sleep apnea    cpap machine x 9 years    Past Surgical History:  Procedure Laterality Date  . BACK SURGERY     spinal fusion  . CARDIAC CATHETERIZATION  05/03/2010   No significant CAD in L coronary system, normal LV function  . CARDIOVASCULAR STRESS TEST  04/15/2007   EKG negative for ischemia, no significant ischemia demonstrated.  . COLONOSCOPY    . DILATION AND CURETTAGE OF UTERUS    . ESOPHAGOGASTRODUODENOSCOPY    . JOINT REPLACEMENT    . KNEE ARTHROPLASTY     bilaterally  . LAPAROSCOPIC ROUX-EN-Y GASTRIC BYPASS WITH UPPER ENDOSCOPY AND REMOVAL OF LAP BAND    . NASAL SINUS SURGERY     1989 and mucous cele removed  . ROTATOR CUFF REPAIR     2001  .  THYROIDECTOMY    . TONSILLECTOMY     and adnoids removed  . TRANSTHORACIC ECHOCARDIOGRAM  05/03/2021   EF 55-65%, normal    There were no vitals filed for this visit.      Subjective Assessment - 01/16/16 1106    Subjective feeling more pain in Left knee today, pleased with progress from last session   Pertinent History right TKR by Dr. Sallyanne Havers 2012;   Limitations Walking;House hold activities;Standing   Patient Stated Goals be stronger;  walk 2 miles; go back and swim   Currently in Pain? Yes   Pain Score 7    Pain Location Knee   Pain Orientation Left   Pain Descriptors / Indicators Aching;Tightness   Pain Type Surgical pain                         [facility] Adult PT Treatment/Exercise - 01/16/16 0001      Knee/Hip Exercises: Aerobic   Stationary Bike level 2 x 6 minutes   Elliptical ramp 5 level 2 x 5 minutes     Knee/Hip Exercises: Machines for Strengthening   Total Gym Leg Press seat 5. bilat #70, left  45# both 2 x 20     Knee/Hip Exercises: Standing   Lateral Step Up Left;2 sets;10 reps;Hand Hold: 1;Step Height: 6"   Forward Step Up Left;2 sets;10 reps;Hand Hold: 1;Step Height: 6"   Step Down Left;2 sets;10 reps;Hand Hold: 2;Step Height: 6"   Rocker Board 3 minutes     Vasopneumatic   Number Minutes Vasopneumatic  15 minutes   Vasopnuematic Location  Knee   Vasopneumatic Pressure High   Vasopneumatic Temperature  3 flakes                  PT Short Term Goals - 01/16/16 1135      PT SHORT TERM GOAL #1   Title The patient will demonstrate compliance with home basic ROM, quad activation, edema management    01/03/16   Status Achieved     PT SHORT TERM GOAL #2   Title The patient will have improved left knee flexion to 100 degrees for greater ease with ascending steps to her bedroom   Status Achieved     PT SHORT TERM GOAL #3   Title The patient will be able to ambulate 20-25 min with cane for community mobility   Status Achieved      PT SHORT TERM GOAL #4   Title The patient will have decreased edema with mid patellar circumferential measurement 40cm or less   Status On-going           PT Long Term Goals - 01/16/16 1135      PT LONG TERM GOAL #1   Title be independent in final HEP for further improvements in strength/ROM   01/31/16   Status On-going     PT LONG TERM GOAL #2   Title Left knee flexion improved to 110 degrees needed for greater ease with descending steps reciprocally   Status Achieved     PT LONG TERM GOAL #3   Title Left knee extension improved to 3 degrees or less for improved gait effciency to walk  1 mile (patient goal for future is return to 2 miles)   Status On-going     PT LONG TERM GOAL #4   Title Quad and HS strength to 4/5 needed for standing and walking for future return to work   Status Achieved     PT LONG TERM GOAL #5   Title FOTO functional outcome score improved from 53% to 43% limitation indicating improved function with less pain   Status On-going               Plan - 01/16/16 1134    Clinical Impression Statement Pt able to increase leg press weight today, improvement with performing steps in reciprocal motion. Still with impaired extension ROM and edema   PT Frequency 3x / week   PT Duration 8 weeks   PT Treatment/Interventions ADLs/Self Care Home Management;Cryotherapy;Electrical Stimulation;Therapeutic exercise;Manual techniques;Patient/family education;Neuromuscular re-education;Scar mobilization;Taping;Vasopneumatic Device   PT Next Visit Plan AROM, strength, edema management   Consulted and Agree with Plan of Care Patient      Patient will benefit from skilled therapeutic intervention in order to improve the following deficits and impairments:  Abnormal gait, Decreased activity tolerance, Decreased strength, Decreased scar mobility, Decreased range of motion, Difficulty walking, Hypomobility, Increased edema, Pain  Visit Diagnosis: Pain in left  knee  Stiffness of left knee, not elsewhere classified  Localized edema  Muscle weakness (generalized)     Problem List Patient Active Problem List   Diagnosis Date Noted  .  Tobacco abuse counseling 07/19/2015  . Essential hypertension 04/02/2013  . Hyperlipidemia   . History of Morbid obesity - status post gastric bypass surgery   . Other abnormal glucose 10/25/2011  . Other malaise and fatigue 10/25/2011  . Myalgia and myositis, unspecified 10/25/2011  . Degeneration of lumbar or lumbosacral intervertebral disc 10/25/2011  . Degeneration of cervical intervertebral disc 10/25/2011  . OSA on CPAP 10/25/2011  . GERD (gastroesophageal reflux disease) 10/25/2011  . IBS (irritable bowel syndrome) 10/25/2011  . COPD (chronic obstructive pulmonary disease) ([place]) 10/25/2011  . Other affections of shoulder region, not elsewhere classified 06/26/2011  . Post herpetic neuralgia 09/16/2003    Isabelle Course, PT, DPT 01/16/2016, 11:37 AM  [facility] [address] [place], [place], [address] Phone: [phone]   Fax:  [phone]  Name: Michaela Raymond MRN: [id number] Date of Birth: 08/25/1955

## 2016-01-19 ENCOUNTER — Ambulatory Visit: Payer: BLUE CROSS/BLUE SHIELD | Attending: Orthopaedic Surgery | Admitting: Physical Therapy

## 2016-01-19 DIAGNOSIS — M25662 Stiffness of left knee, not elsewhere classified: Secondary | ICD-10-CM

## 2016-01-19 DIAGNOSIS — R600 Localized edema: Secondary | ICD-10-CM | POA: Insufficient documentation

## 2016-01-19 DIAGNOSIS — M6281 Muscle weakness (generalized): Secondary | ICD-10-CM | POA: Diagnosis present

## 2016-01-19 DIAGNOSIS — M25562 Pain in left knee: Secondary | ICD-10-CM | POA: Diagnosis not present

## 2016-01-19 NOTE — Therapy (Signed)
[facility] [address] [place], [place], [address] Phone: [phone]   Fax:  [phone]  Physical Therapy Treatment  Patient Details  Name: Michaela Raymond MRN: [id number] Date of Birth: May 19, 1956 Referring Provider: Dr. Sallyanne Havers  Encounter Date: 01/19/2016      PT End of Session - 01/19/16 1230    Visit Number 15   Date for PT Re-Evaluation 01/31/16   Authorization Type BCBS   PT Start Time 1205   PT Stop Time 1245   PT Time Calculation (min) 40 min   Activity Tolerance Patient tolerated treatment well      Past Medical History:  Diagnosis Date  . Adenoma    of thyroid gland, benign  . Angina November 2011   Normal coronary angiogram; noncardiac symptom  . Anxiety   . Arthritis    osteoarthrits  . Asthma   . Barrett's esophagus   . Bronchitis   . Chronic kidney disease    cyst on right kidney hx of  . COPD (chronic obstructive pulmonary disease) ([place])   . Depression   . Diabetes mellitus   . Dysrhythmia   . Fibromyalgia   . GERD (gastroesophageal reflux disease)   . Headache(784.0)   . Heart murmur   . Hyperlipidemia   . Hypertension   . Hypothyroidism   . Morbid obesity ([place])    Status post gastric bypass surgery September 2013  . Osteopenia   . Osteoporosis   . Pneumonia   . Sleep apnea    cpap machine x 9 years    Past Surgical History:  Procedure Laterality Date  . BACK SURGERY     spinal fusion  . CARDIAC CATHETERIZATION  05/03/2010   No significant CAD in L coronary system, normal LV function  . CARDIOVASCULAR STRESS TEST  04/15/2007   EKG negative for ischemia, no significant ischemia demonstrated.  . COLONOSCOPY    . DILATION AND CURETTAGE OF UTERUS    . ESOPHAGOGASTRODUODENOSCOPY    . JOINT REPLACEMENT    . KNEE ARTHROPLASTY     bilaterally  . LAPAROSCOPIC ROUX-EN-Y GASTRIC BYPASS WITH UPPER ENDOSCOPY AND REMOVAL OF LAP BAND    . NASAL SINUS SURGERY     1989 and  mucous cele removed  . ROTATOR CUFF REPAIR     2001  . THYROIDECTOMY    . TONSILLECTOMY     and adnoids removed  . TRANSTHORACIC ECHOCARDIOGRAM  05/03/2021   EF 55-65%, normal    There were no vitals filed for this visit.      Subjective Assessment - 01/19/16 1212    Subjective Patient arrives 20 min late.  I think the swelling is a lot better during the day but swells at night.   Pain is more in the evenings/night.  I'm more balance out in the yard and able to be up on it more than it was.     Currently in Pain? Yes   Pain Score 3    Pain Location Knee   Pain Orientation Left   Pain Type Surgical pain   Pain Onset More than a month ago            [facility] PT Assessment - 01/19/16 0001      Circumferential Edema   Circumferential - Left  41cm     AROM   Left Knee Extension -2  supine   Left Knee Flexion 117  supine  [place] Adult PT Treatment/Exercise - 01/19/16 0001      Knee/Hip Exercises: Aerobic   Elliptical 3 min ramp 5 level 2   Recumbent Bike 3 min     Knee/Hip Exercises: Machines for Strengthening   Total Gym Leg Press seat 5. bilat #70, left 45# both 2 x 20     Knee/Hip Exercises: Standing   Lateral Step Up Left;2 sets;10 reps;Hand Hold: 1;Step Height: 6"   Forward Step Up Left;2 sets;10 reps;Hand Hold: 1;Step Height: 6"   Rocker Board 3 minutes   Walking with Sports Cord 25# backward x 10     Vasopneumatic   Number Minutes Vasopneumatic  15 minutes   Vasopnuematic Location  Knee   Vasopneumatic Pressure High   Vasopneumatic Temperature  3 flakes                  PT Short Term Goals - 01/19/16 1224      PT SHORT TERM GOAL #1   Title The patient will demonstrate compliance with home basic ROM, quad activation, edema management    01/03/16   Status Achieved     PT SHORT TERM GOAL #2   Title The patient will have improved left knee flexion to 100 degrees for greater ease with ascending steps to her bedroom    Status Achieved     PT SHORT TERM GOAL #3   Title The patient will be able to ambulate 20-25 min with cane for community mobility   Status Achieved     PT SHORT TERM GOAL #4   Title The patient will have decreased edema with mid patellar circumferential measurement 40cm or less   Time 4   Period Weeks   Status On-going           PT Long Term Goals - 01/19/16 1224      PT LONG TERM GOAL #1   Title be independent in final HEP for further improvements in strength/ROM   01/31/16   Time 8   Period Weeks   Status On-going     PT LONG TERM GOAL #2   Title Left knee flexion improved to 110 degrees needed for greater ease with descending steps reciprocally   Status Achieved     PT LONG TERM GOAL #3   Title Left knee extension improved to 3 degrees or less for improved gait effciency to walk  1 mile (patient goal for future is return to 2 miles)   Time 8   Period Weeks   Status On-going     PT LONG TERM GOAL #4   Title Quad and HS strength to 4/5 needed for standing and walking for future return to work   Status Achieved     PT LONG TERM GOAL #5   Title FOTO functional outcome score improved from 53% to 43% limitation indicating improved function with less pain   Time 8   Period Weeks   Status On-going               Plan - 01/19/16 1220    Clinical Impression Statement The patient arrives 20 min late secondary to family issues with her mom with Alzheimers.  She is progressing well with edema reduction, ROM and strength although eccentric quad strength with step downs is deficient and contributes to limited endrange extension.   Able to go up and down steps reciprocally at home.  Therapist closely monitoring response with all interventions.     PT Next Visit Plan AROM,  quad strength, edema management;  possible discharge in 2 visits      Patient will benefit from skilled therapeutic intervention in order to improve the following deficits and impairments:      Visit Diagnosis: Pain in left knee  Stiffness of left knee, not elsewhere classified  Localized edema  Muscle weakness (generalized)     Problem List Patient Active Problem List   Diagnosis Date Noted  . Tobacco abuse counseling 07/19/2015  . Essential hypertension 04/02/2013  . Hyperlipidemia   . History of Morbid obesity - status post gastric bypass surgery   . Other abnormal glucose 10/25/2011  . Other malaise and fatigue 10/25/2011  . Myalgia and myositis, unspecified 10/25/2011  . Degeneration of lumbar or lumbosacral intervertebral disc 10/25/2011  . Degeneration of cervical intervertebral disc 10/25/2011  . OSA on CPAP 10/25/2011  . GERD (gastroesophageal reflux disease) 10/25/2011  . IBS (irritable bowel syndrome) 10/25/2011  . COPD (chronic obstructive pulmonary disease) ([place]) 10/25/2011  . Other affections of shoulder region, not elsewhere classified 06/26/2011  . Post herpetic neuralgia 09/16/2003   Ruben Im, PT 01/19/16 12:33 PM Phone: [phone] Fax: [phone]  Alvera Singh 01/19/2016, 12:32 PM  [facility] [address] [place], [place], [address] Phone: [phone]   Fax:  [phone]  Name: Michaela Raymond MRN: [id number] Date of Birth: Dec 17, 1955

## 2016-01-26 ENCOUNTER — Ambulatory Visit: Payer: BLUE CROSS/BLUE SHIELD | Admitting: Physical Therapy

## 2016-01-26 DIAGNOSIS — R600 Localized edema: Secondary | ICD-10-CM

## 2016-01-26 DIAGNOSIS — M25562 Pain in left knee: Secondary | ICD-10-CM | POA: Diagnosis not present

## 2016-01-26 DIAGNOSIS — M25662 Stiffness of left knee, not elsewhere classified: Secondary | ICD-10-CM

## 2016-01-26 DIAGNOSIS — M6281 Muscle weakness (generalized): Secondary | ICD-10-CM

## 2016-01-26 NOTE — Therapy (Signed)
[facility] [address] [place], [place], [address] Phone: [phone]   Fax:  [phone]  Physical Therapy Treatment/Discharge Summary  Patient Details  Name: Michaela Raymond MRN: [id number] Date of Birth: Apr 18, 1956 Referring Provider: Dr. Sallyanne Havers  Encounter Date: 01/26/2016      PT End of Session - 01/26/16 1214    Visit Number 16   Date for PT Re-Evaluation 01/31/16   Authorization Type BCBS   PT Start Time 1153   PT Stop Time 1246   PT Time Calculation (min) 53 min   Activity Tolerance Patient tolerated treatment well      Past Medical History:  Diagnosis Date  . Adenoma    of thyroid gland, benign  . Angina November 2011   Normal coronary angiogram; noncardiac symptom  . Anxiety   . Arthritis    osteoarthrits  . Asthma   . Barrett's esophagus   . Bronchitis   . Chronic kidney disease    cyst on right kidney hx of  . COPD (chronic obstructive pulmonary disease) ([place])   . Depression   . Diabetes mellitus   . Dysrhythmia   . Fibromyalgia   . GERD (gastroesophageal reflux disease)   . Headache(784.0)   . Heart murmur   . Hyperlipidemia   . Hypertension   . Hypothyroidism   . Morbid obesity ([place])    Status post gastric bypass surgery September 2013  . Osteopenia   . Osteoporosis   . Pneumonia   . Sleep apnea    cpap machine x 9 years    Past Surgical History:  Procedure Laterality Date  . BACK SURGERY     spinal fusion  . CARDIAC CATHETERIZATION  05/03/2010   No significant CAD in L coronary system, normal LV function  . CARDIOVASCULAR STRESS TEST  04/15/2007   EKG negative for ischemia, no significant ischemia demonstrated.  . COLONOSCOPY    . DILATION AND CURETTAGE OF UTERUS    . ESOPHAGOGASTRODUODENOSCOPY    . JOINT REPLACEMENT    . KNEE ARTHROPLASTY     bilaterally  . LAPAROSCOPIC ROUX-EN-Y GASTRIC BYPASS WITH UPPER ENDOSCOPY AND REMOVAL OF LAP BAND    . NASAL SINUS SURGERY      1989 and mucous cele removed  . ROTATOR CUFF REPAIR     2001  . THYROIDECTOMY    . TONSILLECTOMY     and adnoids removed  . TRANSTHORACIC ECHOCARDIOGRAM  05/03/2021   EF 55-65%, normal    There were no vitals filed for this visit.      Subjective Assessment - 01/26/16 1156    Subjective Patient arrives 8 min late.  I didn't have have to wake up to take medication last night.  I am feeling better walking out in the yard this week.  I do have to be conscious of my balance though.  I'm slow on the stairs.  I think I could walk 2 miles slowly.    Reports decreased medication usage.     Currently in Pain? Yes   Pain Score 3    Pain Location Knee   Pain Orientation Left            [facility] PT Assessment - 01/26/16 0001      Observation/Other Assessments   Focus on Therapeutic Outcomes (FOTO)  36% limitation     Observation/Other Assessments-Edema    Edema Circumferential  43 cm mid patella     ROM / Strength   AROM /  PROM / Strength AROM     AROM   Left Knee Extension 1  supine (lacks 3 degrees seated)   Left Knee Flexion 124  supine     Strength   Left Knee Flexion 4+/5   Left Knee Extension 4+/5                     [facility] Adult PT Treatment/Exercise - 01/26/16 0001      Knee/Hip Exercises: Aerobic   Elliptical 3 min ramp 5 level 2   Recumbent Bike 8 min full revolutions     Knee/Hip Exercises: Machines for Strengthening   Total Gym Leg Press seat 5. bilat #70, left 45# both 2 x 20     Knee/Hip Exercises: Standing   Lateral Step Up Left;2 sets;10 reps;Hand Hold: 1;Step Height: 6"  with opposite LE abduction   Forward Step Up Left;2 sets;10 reps;Hand Hold: 1;Step Height: 6"  with opposite toe touch to chair     Vasopneumatic   Number Minutes Vasopneumatic  15 minutes   Vasopnuematic Location  Knee   Vasopneumatic Pressure High   Vasopneumatic Temperature  3 flakes                  PT Short Term Goals - 01/26/16 1224      PT  SHORT TERM GOAL #1   Title The patient will demonstrate compliance with home basic ROM, quad activation, edema management    01/03/16   Status Achieved     PT SHORT TERM GOAL #2   Title The patient will have improved left knee flexion to 100 degrees for greater ease with ascending steps to her bedroom   Status Achieved     PT SHORT TERM GOAL #3   Title The patient will be able to ambulate 20-25 min with cane for community mobility   Status Achieved     PT SHORT TERM GOAL #4   Title The patient will have decreased edema with mid patellar circumferential measurement 40cm or less   Status Not Met           PT Long Term Goals - 01/26/16 1226      PT LONG TERM GOAL #1   Title be independent in final HEP for further improvements in strength/ROM   01/31/16   Status Achieved     PT LONG TERM GOAL #2   Title Left knee flexion improved to 110 degrees needed for greater ease with descending steps reciprocally   Status Achieved     PT LONG TERM GOAL #3   Title Left knee extension improved to 3 degrees or less for improved gait effciency to walk  1 mile (patient goal for future is return to 2 miles)   Status Achieved     PT LONG TERM GOAL #4   Title Quad and HS strength to 4/5 needed for standing and walking for future return to work   Status Achieved     PT LONG TERM GOAL #5   Title FOTO functional outcome score improved from 53% to 43% limitation indicating improved function with less pain   Status Achieved               Plan - 01/26/16 1215    Clinical Impression Statement The patient has made good progress with rehab progression.  She is able to walk on level surfaces with minimal to no limp.  She reports she can walk about 2 miles.  She is able to go  up and down steps reciprocally.  Left knee AROM 1-124 supine degrees, 3-122 seated.   Quad and HS strength 4+/5.  Mild swelling.  Patient has met rehab goals and is ready for discharge to independent HEP.       PHYSICAL  THERAPY DISCHARGE SUMMARY  Visits from Start of Care: 16  Current functional level related to goals / functional outcomes: See clinical impressions above.  Good progress with PT. Remaining deficits: As above.  Mild swell around knee depending on position/time of day.   Education / Equipment:Progressive HEP Plan: Patient agrees to discharge.  Patient goals were met. Patient is being discharged due to meeting the stated rehab goals.  ?????       Patient will benefit from skilled therapeutic intervention in order to improve the following deficits and impairments:     Visit Diagnosis: Pain in left knee  Stiffness of left knee, not elsewhere classified  Localized edema  Muscle weakness (generalized)     Problem List Patient Active Problem List   Diagnosis Date Noted  . Tobacco abuse counseling 07/19/2015  . Essential hypertension 04/02/2013  . Hyperlipidemia   . History of Morbid obesity - status post gastric bypass surgery   . Other abnormal glucose 10/25/2011  . Other malaise and fatigue 10/25/2011  . Myalgia and myositis, unspecified 10/25/2011  . Degeneration of lumbar or lumbosacral intervertebral disc 10/25/2011  . Degeneration of cervical intervertebral disc 10/25/2011  . OSA on CPAP 10/25/2011  . GERD (gastroesophageal reflux disease) 10/25/2011  . IBS (irritable bowel syndrome) 10/25/2011  . COPD (chronic obstructive pulmonary disease) ([place]) 10/25/2011  . Other affections of shoulder region, not elsewhere classified 06/26/2011  . Post herpetic neuralgia 09/16/2003   Ruben Im, PT 01/26/16 1:26 PM Phone: [phone] Fax: [phone]  Alvera Singh 01/26/2016, 1:24 PM  [facility] [address], [place], [place], [address] Phone: [phone]   Fax:  [phone]  Name: Michaela Raymond MRN: [id number] Date of Birth: 21-Dec-1955

## 2016-07-12 ENCOUNTER — Encounter (INDEPENDENT_AMBULATORY_CARE_PROVIDER_SITE_OTHER): Payer: Self-pay

## 2016-07-12 ENCOUNTER — Encounter (INDEPENDENT_AMBULATORY_CARE_PROVIDER_SITE_OTHER): Payer: Self-pay | Admitting: Physical Medicine and Rehabilitation

## 2016-07-12 ENCOUNTER — Telehealth (INDEPENDENT_AMBULATORY_CARE_PROVIDER_SITE_OTHER): Payer: Self-pay | Admitting: Physical Medicine and Rehabilitation

## 2016-07-12 ENCOUNTER — Ambulatory Visit (INDEPENDENT_AMBULATORY_CARE_PROVIDER_SITE_OTHER): Payer: BLUE CROSS/BLUE SHIELD | Admitting: Physical Medicine and Rehabilitation

## 2016-07-12 VITALS — BP 139/80 | HR 56

## 2016-07-12 DIAGNOSIS — G894 Chronic pain syndrome: Secondary | ICD-10-CM | POA: Diagnosis not present

## 2016-07-12 DIAGNOSIS — M461 Sacroiliitis, not elsewhere classified: Secondary | ICD-10-CM

## 2016-07-12 DIAGNOSIS — M25552 Pain in left hip: Secondary | ICD-10-CM | POA: Diagnosis not present

## 2016-07-12 DIAGNOSIS — M48062 Spinal stenosis, lumbar region with neurogenic claudication: Secondary | ICD-10-CM

## 2016-07-12 DIAGNOSIS — M961 Postlaminectomy syndrome, not elsewhere classified: Secondary | ICD-10-CM | POA: Diagnosis not present

## 2016-07-12 NOTE — Progress Notes (Addendum)
Michaela Raymond - 61 y.o. female MRN [id number]  Date of birth: [birth year]  Office Visit Note: Visit Date: 07/12/2016 PCP: Nena Polio, NP Referred by: Nena Polio, NP  Subjective: Chief Complaint  Patient presents with  . Lower Back - Pain   HPI: Michaela Raymond is a 61 year old female that I haven't seen since 2016. She is a chronic pain patient with extensive lower back surgery with fusion at L4-5 and L5-S1. She also has bilateral knee replacements. 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 She has had gastric bypass surgery and has lost a great deal of weight over the time that I have seen her. The last time we saw her there was a difficult situation arising where she was using opioid medications for pain management to a degree with different surgeries. We did tell her that time that we are not a pain clinic and I cannot do chronic medication management. She comes back in today with worsening low back pain. Lower back pain different from in the past- more right sided and more severe. Difficulty sleeping. Trying to stay active- exercising, riding bike. Wants to be able to keep working out and wants to avoid surgery. Right buttock pain. No numbness or tingling, no leg pain. Usually wears lidocaine patch, applies heat which help. Has been taking half of a low dose of Dilaudid on and off over the last week (left over from knee surgery last year) to help her sleep. She takes very low-dose amount of gabapentin. She denies any focal weakness or bowel or bladder symptoms. She's had no focal trauma. The left buttock pain is severe and limiting what she would like to do. The MRI that we ordered 2016 is reviewed below. She does have adjacent level disease with severe multifactorial stenosis at L3-4.     Review of Systems  Constitutional: Negative for chills, fever, malaise/fatigue and weight loss.  HENT: Negative for hearing loss and sinus pain.   Eyes: Negative for blurred vision, double vision  and photophobia.  Respiratory: Negative for cough and shortness of breath.   Cardiovascular: Negative for chest pain, palpitations and leg swelling.  Gastrointestinal: Negative for abdominal pain, nausea and vomiting.  Genitourinary: Negative for flank pain.  Musculoskeletal: Positive for back pain. Negative for myalgias.  Skin: Negative for itching and rash.  Neurological: Negative for tremors, focal weakness and weakness.  Endo/Heme/Allergies: Negative.   Psychiatric/Behavioral: Negative for depression.  All other systems reviewed and are negative.  Otherwise per HPI.  Assessment & Plan: Visit Diagnoses:  1. Spinal stenosis of lumbar region with neurogenic claudication   2. Pain in left hip   3. Post laminectomy syndrome   4. Sacroiliitis ([place])   5. Chronic pain syndrome     Plan: Findings:  Chronic pain syndrome, located pain patient with multiple orthopedic complaints and surgeries. She has done well with weight loss with gastric bypass. She comes in today with worsening left buttock and low back pain. This is likely either from the stenosis at L3-for with radiculopathy radiculitis may be an S1 dermatome. It could also be sacroiliac joint mediated pain with a fusion at L5-S1. She's not gotten relief with any conservative care at this point has been going on for several months. She's had no new trauma no red flag symptoms. I think the best approach would be a diagnostic L3 transforaminal injection on the right. If that didn't help but I would look at a one-time sacroiliac joint injection. She might benefit from denervation  or prolotherapy of the sacroiliac joint this is the source of the pain. Policy would like to regroup with the therapist for sacroiliac joint manipulation if this were found to be the source of it. I would favor the epidural injection first because if that makes a big difference that she is To make some decisions about the stenosis above her prior surgery. I spent more than  25 minutes speaking face-to-face with the patient with 50% of the time in counseling. Again if it turns into more of an opioid situation I think we'll have to look at comprehensive pain clinic.    Meds & Orders: No orders of the defined types were placed in this encounter.  No orders of the defined types were placed in this encounter.   Follow-up: Return for Right L3 transforaminal injection.   Procedures: No procedures performed  No notes on file   Clinical History: Lumbar spine MRI 09/12/2014 IMPRESSION: 1. Enlarged right renal collecting system since 2011 compatible with hydronephrosis. No hydroureter identified, perhaps this represents acquired UPJ scarring/stenosis. Followup renal ultrasound could be compared to the prior renal ultrasound on 05/02/2009. Alternatively IV contrast-enhanced CT Abdomen and Pelvis could also prove valuable. 2. Postoperative changes from decompression and fusion at L4-L5 and L5-S1 with no adverse features. 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 3. Multifactorial mild spinal and left foraminal stenosis at L2-L3.  She reports that she has been smoking.  She started smoking about 4 years ago. She has a 15.00 pack-year smoking history. She has never used smokeless tobacco. No results for input(s): HGBA1C, LABURIC in the last 8760 hours.  Objective:  VS:  HT:    WT:   BMI:     BP:139/80  HR:(!) 56bpm  TEMP: ( )  RESP:  Physical Exam  Constitutional: She is oriented to person, place, and time. She appears well-developed and well-nourished.  Eyes: Conjunctivae and EOM are normal. Pupils are equal, round, and reactive to light.  Cardiovascular: Normal rate and intact distal pulses.   Pulmonary/Chest: Effort normal.  Musculoskeletal:  The patient ambulates without aid with a fairly normal gait somewhat of a forward flexed spine. She has some pain with extension of the spine but is  very stiff. She has trigger points in the quadratus lumborum and gluteus medius. She has no pain over the greater trochanters and no pain with hip rotation. She does have clunking of both knees with strength testing. She has good full 5 out of 5 strength in both lower extremities. She has no clonus bilaterally. She has essentially a positive Fortin finger test on the left. Patrick's test is equivocal.  Neurological: She is alert and oriented to person, place, and time.  Skin: Skin is warm and dry. No rash noted. No erythema.  Psychiatric: She has a normal mood and affect. Her behavior is normal.  Nursing note and vitals reviewed.   Ortho Exam Imaging: No results found.  Past Medical/Family/Surgical/Social History: Medications & Allergies reviewed per EMR Patient Active Problem List   Diagnosis Date Noted  . Chronic pain syndrome 07/13/2016  . Post laminectomy syndrome 07/13/2016  . Spinal stenosis of lumbar region with neurogenic claudication 07/13/2016  . Tobacco abuse counseling 07/19/2015  . Essential hypertension 04/02/2013  . Hyperlipidemia   . History of Morbid obesity - status post gastric bypass surgery   . Other abnormal glucose 10/25/2011  . Other malaise and fatigue 10/25/2011  . Myalgia and myositis, unspecified 10/25/2011  .  Degeneration of lumbar or lumbosacral intervertebral disc 10/25/2011  . Degeneration of cervical intervertebral disc 10/25/2011  . OSA on CPAP 10/25/2011  . GERD (gastroesophageal reflux disease) 10/25/2011  . IBS (irritable bowel syndrome) 10/25/2011  . COPD (chronic obstructive pulmonary disease) ([place]) 10/25/2011  . Other affections of shoulder region, not elsewhere classified 06/26/2011  . Post herpetic neuralgia 09/16/2003   Past Medical History:  Diagnosis Date  . Adenoma    of thyroid gland, benign  . Angina November 2011   Normal coronary angiogram; noncardiac symptom  . Anxiety   . Arthritis    osteoarthrits  . Asthma   . Barrett's  esophagus   . Bronchitis   . Chronic kidney disease    cyst on right kidney hx of  . COPD (chronic obstructive pulmonary disease) ([place])   . Depression   . Diabetes mellitus   . Dysrhythmia   . Fibromyalgia   . GERD (gastroesophageal reflux disease)   . Headache(784.0)   . Heart murmur   . Hyperlipidemia   . Hypertension   . Hypothyroidism   . Morbid obesity ([place])    Status post gastric bypass surgery September 2013  . Osteopenia   . Osteoporosis   . Pneumonia   . Sleep apnea    cpap machine x 9 years   Family History  Problem Relation Age of Onset  . Mental illness Sister   . Hypertension Brother   . Heart murmur Brother   . Arrhythmia Maternal Grandmother     Atrial Fibrillation  . COPD Maternal Grandmother   . Cancer Maternal Grandfather   . Alzheimer's disease Paternal Grandmother   . COPD Paternal Grandfather   . Heart failure Paternal Grandfather   . Allergies Son   . Allergies Daughter   . Allergies Daughter   . Diabetes Father   . Hypertension Father   . Heart failure Father   . COPD Father   . Heart attack Father   . Arthritis Father   . Heart murmur Mother   . Hypothyroidism Mother   . Heart disease Mother    Past Surgical History:  Procedure Laterality Date  . BACK SURGERY     spinal fusion  . CARDIAC CATHETERIZATION  05/03/2010   No significant CAD in L coronary system, normal LV function  . CARDIOVASCULAR STRESS TEST  04/15/2007   EKG negative for ischemia, no significant ischemia demonstrated.  . COLONOSCOPY    . DILATION AND CURETTAGE OF UTERUS    . ESOPHAGOGASTRODUODENOSCOPY    . JOINT REPLACEMENT    . KNEE ARTHROPLASTY     bilaterally  . LAPAROSCOPIC ROUX-EN-Y GASTRIC BYPASS WITH UPPER ENDOSCOPY AND REMOVAL OF LAP BAND    . NASAL SINUS SURGERY     1989 and mucous cele removed  . ROTATOR CUFF REPAIR     2001  . THYROIDECTOMY    . TONSILLECTOMY     and adnoids removed  . TRANSTHORACIC ECHOCARDIOGRAM  05/03/2021   EF 55-65%, normal     Social History   Occupational History  . unemployed    Social History Main Topics  . Smoking status: Current Every Day Smoker    Packs/day: 0.50    Years: 30.00    Start date: 02/19/2012  . Smokeless tobacco: Never Used  . Alcohol use 0.0 oz/week     Comment: occassional wine  . Drug use: No  . Sexual activity: Yes

## 2016-07-13 ENCOUNTER — Encounter (INDEPENDENT_AMBULATORY_CARE_PROVIDER_SITE_OTHER): Payer: Self-pay | Admitting: Physical Medicine and Rehabilitation

## 2016-07-13 DIAGNOSIS — M48062 Spinal stenosis, lumbar region with neurogenic claudication: Secondary | ICD-10-CM | POA: Insufficient documentation

## 2016-07-13 DIAGNOSIS — M961 Postlaminectomy syndrome, not elsewhere classified: Secondary | ICD-10-CM | POA: Insufficient documentation

## 2016-07-13 DIAGNOSIS — G894 Chronic pain syndrome: Secondary | ICD-10-CM | POA: Insufficient documentation

## 2016-07-16 NOTE — Telephone Encounter (Signed)
Active bcbs coverage and no prior Josem Kaufmann is required

## 2016-07-17 ENCOUNTER — Encounter (INDEPENDENT_AMBULATORY_CARE_PROVIDER_SITE_OTHER): Payer: Self-pay | Admitting: Physical Medicine and Rehabilitation

## 2016-07-17 ENCOUNTER — Ambulatory Visit (INDEPENDENT_AMBULATORY_CARE_PROVIDER_SITE_OTHER): Payer: BLUE CROSS/BLUE SHIELD | Admitting: Physical Medicine and Rehabilitation

## 2016-07-17 ENCOUNTER — Ambulatory Visit (INDEPENDENT_AMBULATORY_CARE_PROVIDER_SITE_OTHER): Payer: Self-pay

## 2016-07-17 VITALS — BP 141/79 | HR 59

## 2016-07-17 DIAGNOSIS — M961 Postlaminectomy syndrome, not elsewhere classified: Secondary | ICD-10-CM

## 2016-07-17 DIAGNOSIS — M48062 Spinal stenosis, lumbar region with neurogenic claudication: Secondary | ICD-10-CM

## 2016-07-17 DIAGNOSIS — M5416 Radiculopathy, lumbar region: Secondary | ICD-10-CM | POA: Diagnosis not present

## 2016-07-17 MED ORDER — METHYLPREDNISOLONE ACETATE 80 MG/ML IJ SUSP
80.0000 mg | Freq: Once | INTRAMUSCULAR | Status: AC
  Administered 2016-07-17: 80 mg

## 2016-07-17 MED ORDER — LIDOCAINE HCL (PF) 1 % IJ SOLN
0.3300 mL | Freq: Once | INTRAMUSCULAR | Status: AC
  Administered 2016-07-17: 0.3 mL

## 2016-07-17 NOTE — Patient Instructions (Signed)

## 2016-07-17 NOTE — Progress Notes (Signed)
[name] - 61 y.o. female MRN [id number]  Date of birth: July 09, 1955  Office Visit Note: Visit Date: 07/17/2016 PCP: Nena Polio, NP Referred by: Nena Polio, NP  Subjective: Chief Complaint  Patient presents with  . Lower Back - Pain   HPI: [name] is a 61 year old female with a fairly extensive history and are orthopedic office and with [place] off and on over the few years we seen her. Please see our last note for details of the evaluation and management. The patient again reports bilateral knee problems after bilateral arthroplasties. She reports worsening chronic pain and ask if we can continue her Dilaudid. This was actually a problem we were having we saw her a couple of years ago and this was not fully addressed we saw her most recently in evaluation. I did discuss with her briefly that we would not take on managing her Dilaudid prescription. If her primary care physician feels comfortable with this is something he could review and management with her. She really probably would do better with a comprehensive pain management program and she does have severe issues of the lumbar spine and potential sacroiliac joint is of all been discussed at length in the past. Patient is here today for planned right L3 tranforaminal injection. No change in symtoms    ROS Otherwise per HPI.  Assessment & Plan: Visit Diagnoses:  1. Lumbar radiculopathy   2. Spinal stenosis of lumbar region with neurogenic claudication   3. Post laminectomy syndrome     Plan: Findings:  Right L3 transforaminal epidural steroid injection.    Meds & Orders:  Meds ordered this encounter  Medications  . lidocaine (PF) (XYLOCAINE) 1 % injection 0.3 mL  . methylPREDNISolone acetate (DEPO-MEDROL) injection 80 mg    Orders Placed This Encounter  Procedures  . XR C-ARM NO REPORT  . Epidural Steroid injection    Follow-up: Return if symptoms worsen or fail to improve after 2 weeks, for potenital  Sacroiliac injection, potential pain clinic referral versus spine surgeon.   Procedures: No procedures performed  Lumbosacral Transforaminal Epidural Steroid Injection - Infraneural Approach with Fluoroscopic Guidance  Patient: Michaela Raymond      Date of Birth: [birth year] MRN: [id number] PCP: Nena Polio, NP      Visit Date: 07/17/2016   Universal Protocol:    Date/Time: 01/31/185:59 AM  Consent Given By: the patient  Position: PRONE   Additional Comments: Vital signs were monitored before and after the procedure. Patient was prepped and draped in the usual sterile fashion. The correct patient, procedure, and site was verified.   Injection Procedure Details:  Procedure Site One Meds Administered:  Meds ordered this encounter  Medications  . lidocaine (PF) (XYLOCAINE) 1 % injection 0.3 mL  . methylPREDNISolone acetate (DEPO-MEDROL) injection 80 mg      Laterality: Right  Location/Site:  L3-L4  Needle size: 22 G  Needle type: Spinal  Needle Placement: Transforaminal  Findings:  -Contrast Used: 1 mL iohexol 180 mg iodine/mL   -Comments: Excellent flow of contrast along the nerve and into the epidural space.  Procedure Details: After squaring off the end-plates of the desired vertebral level to get a true AP view, the C-arm was obliqued to the painful side so that the superior articulating process is positioned about 1/3 the length of the inferior endplate.  The needle was aimed toward the junction of the superior articular process and the transverse process of the inferior vertebrae. The needle's initial entry is in the  lower third of the foramen through Kambin's triangle. The soft tissues overlying this target were infiltrated with 2-3 ml. of 1% Lidocaine without Epinephrine.  The spinal needle was then inserted and advanced toward the target using a "trajectory" view along the fluoroscope beam.  Under AP and lateral visualization, the needle was advanced so it did  not puncture dura and did not traverse medially beyond the 6 o'clock position of the pedicle. Bi-planar projections were used to confirm position. Aspiration was confirmed to be negative for CSF and/or blood. A 1-2 ml. volume of Isovue-250 was injected and flow of contrast was noted at each level. Radiographs were obtained for documentation purposes.   After attaining the desired flow of contrast documented above, a 0.5 to 1.0 ml test dose of 0.25% Marcaine was injected into each respective transforaminal space.  The patient was observed for 90 seconds post injection.  After no sensory deficits were reported, and normal lower extremity motor function was noted,   the above injectate was administered so that equal amounts of the injectate were placed at each foramen (level) into the transforaminal epidural space.   Additional Comments:  The patient tolerated the procedure well Dressing: Band-Aid    Post-procedure details: Patient was observed during the procedure. Post-procedure instructions were reviewed.  Patient left the clinic in stable condition.   Clinical History: Lumbar spine MRI 09/12/2014 IMPRESSION: 1. Enlarged right renal collecting system since 2011 compatible with hydronephrosis. No hydroureter identified, perhaps this represents acquired UPJ scarring/stenosis. Followup renal ultrasound could be compared to the prior renal ultrasound on 05/02/2009. Alternatively IV contrast-enhanced CT Abdomen and Pelvis could also prove valuable. 2. Postoperative changes from decompression and fusion at L4-L5 and L5-S1 with no adverse features. 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 3. Multifactorial mild spinal and left foraminal stenosis at L2-L3.  She reports that she has been smoking.  She started smoking about 4 years ago. She has a 15.00 pack-year smoking history. She has never used smokeless tobacco. No results  for input(s): HGBA1C, LABURIC in the last 8760 hours.  Objective:  VS:  HT:    WT:   BMI:     BP:(!) 141/79  HR:(!) 59bpm  TEMP: ( )  RESP:97 % Physical Exam  Musculoskeletal:  Patient ambulates without aid with a forward flexed spine.    Ortho Exam Imaging: Xr C-arm No Report  Result Date: 07/17/2016 Please see Notes or Procedures tab for imaging impression.   Past Medical/Family/Surgical/Social History: Medications & Allergies reviewed per EMR Patient Active Problem List   Diagnosis Date Noted  . Chronic pain syndrome 07/13/2016  . Post laminectomy syndrome 07/13/2016  . Spinal stenosis of lumbar region with neurogenic claudication 07/13/2016  . Tobacco abuse counseling 07/19/2015  . Essential hypertension 04/02/2013  . Hyperlipidemia   . History of Morbid obesity - status post gastric bypass surgery   . Other abnormal glucose 10/25/2011  . Other malaise and fatigue 10/25/2011  . Myalgia and myositis, unspecified 10/25/2011  . Degeneration of lumbar or lumbosacral intervertebral disc 10/25/2011  . Degeneration of cervical intervertebral disc 10/25/2011  . OSA on CPAP 10/25/2011  . GERD (gastroesophageal reflux disease) 10/25/2011  . IBS (irritable bowel syndrome) 10/25/2011  . COPD (chronic obstructive pulmonary disease) ([place]) 10/25/2011  . Other affections of shoulder region, not elsewhere classified 06/26/2011  . Post herpetic neuralgia 09/16/2003   Past Medical History:  Diagnosis Date  . Adenoma    of thyroid gland, benign  .  Angina November 2011   Normal coronary angiogram; noncardiac symptom  . Anxiety   . Arthritis    osteoarthrits  . Asthma   . Barrett's esophagus   . Bronchitis   . Chronic kidney disease    cyst on right kidney hx of  . COPD (chronic obstructive pulmonary disease) ([place])   . Depression   . Diabetes mellitus   . Dysrhythmia   . Fibromyalgia   . GERD (gastroesophageal reflux disease)   . Headache(784.0)   . Heart murmur   .  Hyperlipidemia   . Hypertension   . Hypothyroidism   . Morbid obesity ([place])    Status post gastric bypass surgery September 2013  . Osteopenia   . Osteoporosis   . Pneumonia   . Sleep apnea    cpap machine x 9 years   Family History  Problem Relation Age of Onset  . Mental illness Sister   . Hypertension Brother   . Heart murmur Brother   . Arrhythmia Maternal Grandmother     Atrial Fibrillation  . COPD Maternal Grandmother   . Cancer Maternal Grandfather   . Alzheimer's disease Paternal Grandmother   . COPD Paternal Grandfather   . Heart failure Paternal Grandfather   . Allergies Son   . Allergies Daughter   . Allergies Daughter   . Diabetes Father   . Hypertension Father   . Heart failure Father   . COPD Father   . Heart attack Father   . Arthritis Father   . Heart murmur Mother   . Hypothyroidism Mother   . Heart disease Mother    Past Surgical History:  Procedure Laterality Date  . BACK SURGERY     spinal fusion  . CARDIAC CATHETERIZATION  05/03/2010   No significant CAD in L coronary system, normal LV function  . CARDIOVASCULAR STRESS TEST  04/15/2007   EKG negative for ischemia, no significant ischemia demonstrated.  . COLONOSCOPY    . DILATION AND CURETTAGE OF UTERUS    . ESOPHAGOGASTRODUODENOSCOPY    . JOINT REPLACEMENT    . KNEE ARTHROPLASTY     bilaterally  . LAPAROSCOPIC ROUX-EN-Y GASTRIC BYPASS WITH UPPER ENDOSCOPY AND REMOVAL OF LAP BAND    . NASAL SINUS SURGERY     1989 and mucous cele removed  . ROTATOR CUFF REPAIR     2001  . THYROIDECTOMY    . TONSILLECTOMY     and adnoids removed  . TRANSTHORACIC ECHOCARDIOGRAM  05/03/2021   EF 55-65%, normal   Social History   Occupational History  . unemployed    Social History Main Topics  . Smoking status: Current Every Day Smoker    Packs/day: 0.50    Years: 30.00    Start date: 02/19/2012  . Smokeless tobacco: Never Used  . Alcohol use 0.0 oz/week     Comment: occassional wine  . Drug  use: No  . Sexual activity: Yes

## 2016-07-18 NOTE — Procedures (Signed)
Lumbosacral Transforaminal Epidural Steroid Injection - Infraneural Approach with Fluoroscopic Guidance  Patient: Michaela Raymond      Date of Birth: 1955/12/05 MRN: [id number] PCP: Nena Polio, NP      Visit Date: 07/17/2016   Universal Protocol:    Date/Time: 01/31/185:59 AM  Consent Given By: the patient  Position: PRONE   Additional Comments: Vital signs were monitored before and after the procedure. Patient was prepped and draped in the usual sterile fashion. The correct patient, procedure, and site was verified.   Injection Procedure Details:  Procedure Site One Meds Administered:  Meds ordered this encounter  Medications  . lidocaine (PF) (XYLOCAINE) 1 % injection 0.3 mL  . methylPREDNISolone acetate (DEPO-MEDROL) injection 80 mg      Laterality: Right  Location/Site:  L3-L4  Needle size: 22 G  Needle type: Spinal  Needle Placement: Transforaminal  Findings:  -Contrast Used: 1 mL iohexol 180 mg iodine/mL   -Comments: Excellent flow of contrast along the nerve and into the epidural space.  Procedure Details: After squaring off the end-plates of the desired vertebral level to get a true AP view, the C-arm was obliqued to the painful side so that the superior articulating process is positioned about 1/3 the length of the inferior endplate.  The needle was aimed toward the junction of the superior articular process and the transverse process of the inferior vertebrae. The needle's initial entry is in the lower third of the foramen through Kambin's triangle. The soft tissues overlying this target were infiltrated with 2-3 ml. of 1% Lidocaine without Epinephrine.  The spinal needle was then inserted and advanced toward the target using a "trajectory" view along the fluoroscope beam.  Under AP and lateral visualization, the needle was advanced so it did not puncture dura and did not traverse medially beyond the 6 o'clock position of the pedicle. Bi-planar projections  were used to confirm position. Aspiration was confirmed to be negative for CSF and/or blood. A 1-2 ml. volume of Isovue-250 was injected and flow of contrast was noted at each level. Radiographs were obtained for documentation purposes.   After attaining the desired flow of contrast documented above, a 0.5 to 1.0 ml test dose of 0.25% Marcaine was injected into each respective transforaminal space.  The patient was observed for 90 seconds post injection.  After no sensory deficits were reported, and normal lower extremity motor function was noted,   the above injectate was administered so that equal amounts of the injectate were placed at each foramen (level) into the transforaminal epidural space.   Additional Comments:  The patient tolerated the procedure well Dressing: Band-Aid    Post-procedure details: Patient was observed during the procedure. Post-procedure instructions were reviewed.  Patient left the clinic in stable condition.

## 2016-08-01 ENCOUNTER — Telehealth (INDEPENDENT_AMBULATORY_CARE_PROVIDER_SITE_OTHER): Payer: Self-pay | Admitting: Physical Medicine and Rehabilitation

## 2016-08-01 NOTE — Telephone Encounter (Signed)
SI Joint injection, surgical opinion, Pt, comprehensive Pain mgt.

## 2016-08-02 NOTE — Telephone Encounter (Signed)
Patient wants to try the SI Joint injection first. Scheduled for 08/08/16 at 1415.

## 2016-08-07 ENCOUNTER — Telehealth (INDEPENDENT_AMBULATORY_CARE_PROVIDER_SITE_OTHER): Payer: Self-pay

## 2016-08-07 DIAGNOSIS — M961 Postlaminectomy syndrome, not elsewhere classified: Secondary | ICD-10-CM

## 2016-08-08 ENCOUNTER — Ambulatory Visit (INDEPENDENT_AMBULATORY_CARE_PROVIDER_SITE_OTHER): Payer: BLUE CROSS/BLUE SHIELD | Admitting: Physical Medicine and Rehabilitation

## 2016-08-09 MED ORDER — HYDROCODONE-ACETAMINOPHEN 5-325 MG PO TABS: ORAL_TABLET | ORAL | 0 refills | Status: DC

## 2016-08-09 NOTE — Telephone Encounter (Signed)
See documentation below. Prescription was printed

## 2016-08-09 NOTE — Telephone Encounter (Signed)
Mrs. Asmar is a 61 year old female that were not completed a sacroiliac joint injection but unfortunately the fluoroscope in our office has had some issues that were getting repaired. She did ask for some pain medication until we can see her. She's had a history of using Dilaudid for many years but at least in terms of looking at the [facility] she has not had that prescription filled since September. She is a former [employer] trader. She has had issues in the past with chronic pain medications. She does have multilevel fusion with adjacent level disease. I did go ahead and refill her prescription today for hydrocodone for a few days.

## 2016-08-09 NOTE — Telephone Encounter (Signed)
Called patient and notified her that prescription is at our front desk for pick ip.

## 2016-08-20 ENCOUNTER — Encounter (INDEPENDENT_AMBULATORY_CARE_PROVIDER_SITE_OTHER): Payer: Self-pay | Admitting: Physical Medicine and Rehabilitation

## 2016-08-20 ENCOUNTER — Ambulatory Visit (INDEPENDENT_AMBULATORY_CARE_PROVIDER_SITE_OTHER): Payer: BLUE CROSS/BLUE SHIELD | Admitting: Physical Medicine and Rehabilitation

## 2016-08-20 ENCOUNTER — Ambulatory Visit (INDEPENDENT_AMBULATORY_CARE_PROVIDER_SITE_OTHER): Payer: Self-pay

## 2016-08-20 VITALS — BP 134/77

## 2016-08-20 DIAGNOSIS — M48061 Spinal stenosis, lumbar region without neurogenic claudication: Secondary | ICD-10-CM

## 2016-08-20 DIAGNOSIS — M461 Sacroiliitis, not elsewhere classified: Secondary | ICD-10-CM | POA: Diagnosis not present

## 2016-08-20 DIAGNOSIS — M961 Postlaminectomy syndrome, not elsewhere classified: Secondary | ICD-10-CM

## 2016-08-20 MED ORDER — METHYLPREDNISOLONE ACETATE 80 MG/ML IJ SUSP
80.0000 mg | INTRAMUSCULAR | Status: AC | PRN
  Administered 2016-08-20: 80 mg via INTRA_ARTICULAR

## 2016-08-20 MED ORDER — BUPIVACAINE HCL 0.5 % IJ SOLN
2.0000 mL | INTRAMUSCULAR | Status: AC | PRN
  Administered 2016-08-20: 2 mL via INTRA_ARTICULAR

## 2016-08-20 NOTE — Progress Notes (Signed)
TEAGAN MAROTTI - 61 y.o. female MRN [id number]  Date of birth: 03/17/1956  Office Visit Note: Visit Date: 08/20/2016 PCP: Nena Polio, NP Referred by: Nena Polio, NP  Subjective: Chief Complaint  Patient presents with  . Lower Back - Pain   HPI: Ms. Laughrey is a 61 year old female with complicated pain history and complicated spine and joint history. By brief review she has had lumbar instrumented fusion at L4-5 and L5-S1 by Dr. Arnoldo Morale in 2011 I believe. She is also had bilateral knee replacements. She also has lost a tremendous amount of weight with gastric bypass. She did work as a Equities trader. She has gone on to have chronic low back and buttock pain. This was managed by Dr. Nelva Bush with injections and medication management. The patient had been taking Dilaudid. He ultimately referred her here in 2013 for radiofrequency ablation of the right sacroiliac joint. We performed denervation of the L5 and S1 and S2 lateral branches in 2013 and 2015 with decent relief.  We saw her more recently and updated a MRI of the lumbar spine showing adjacent level severe stenosis at L3-4. I completed transforaminal injection recently at the level of stenosis and she states she only had a couple days of relief with the last injection. Pain right side low back into buttock. Denies pain down leg. Tingling back of leg. Pain worse with bending and twisting to the right. She reports the pain at this point is as bad as its ever been.    Review of Systems  Constitutional: Negative for chills, fever, malaise/fatigue and weight loss.  HENT: Negative for hearing loss and sinus pain.   Eyes: Negative for blurred vision, double vision and photophobia.  Respiratory: Negative for cough and shortness of breath.   Cardiovascular: Negative for chest pain, palpitations and leg swelling.  Gastrointestinal: Negative for abdominal pain, nausea and vomiting.  Genitourinary: Negative for flank pain.  Musculoskeletal:  Positive for back pain and joint pain. Negative for myalgias.  Skin: Negative for itching and rash.  Neurological: Negative for tremors, focal weakness and weakness.  Endo/Heme/Allergies: Negative.   Psychiatric/Behavioral: Negative for depression.  All other systems reviewed and are negative.  Otherwise per HPI.  Assessment & Plan: Visit Diagnoses:  1. Sacroiliitis ([place])   2. Post laminectomy syndrome   3. Spinal stenosis of lumbar region without neurogenic claudication     Plan: Findings:  Diagnostic and therapeutic right sacroiliac joint injection fluoroscopic guidance. If this seems to relieve some of her symptoms to a great deal more than 50% I would look at repeating the ablation of the L5 and S1-S2 lateral branches. I think she be a good candidate for that. She's also as we have mentioned in the past a candidate for chronic pain management. We have told her that in our office we are not doing medication management to a degree with chronic opioids and there are 2 people to do that. She can actually see Dr. Nelva Bush again. In terms of her stenosis if the injection doesn't help in the injection at the level of stenosis didn't help then it may be worth referring her to a neurosurgeon. She would like to see someone different than Dr. Arnoldo Morale.  Consider referral to Neurosurgeon  Meds & Orders: No orders of the defined types were placed in this encounter.   Orders Placed This Encounter  Procedures  . Large Joint Injection/Arthrocentesis  . XR C-ARM NO REPORT    Follow-up: Return if symptoms worsen or fail to improve.  Procedures: Sacroiliac Joint Inj Date/Time: 08/20/2016 10:47 AM Performed by: Magnus Sinning Authorized by: Magnus Sinning   Consent Given by:  Patient Site marked: the procedure site was marked   Timeout: prior to procedure the correct patient, procedure, and site was verified   Indications:  Pain and diagnostic evaluation Location:  Sacroiliac Site:  R sacroiliac  joint Prep: patient was prepped and draped in usual sterile fashion   Needle Size:  22 G Needle Length:  3.5 inches Approach:  Posterior Ultrasound Guidance: No   Fluoroscopic Guidance: Yes   Arthrogram: No   Medications:  2 mL bupivacaine 0.5 %; 80 mg methylPREDNISolone acetate 80 MG/ML Aspiration Attempted: No   Patient tolerance:  Patient tolerated the procedure well with no immediate complications  There was excellent flow of contrast producing a partial arthrogram of the sacroiliac joint.      No notes on file   Clinical History: Lumbar spine MRI 09/12/2014 IMPRESSION: 1. Enlarged right renal collecting system since 2011 compatible with hydronephrosis. No hydroureter identified, perhaps this represents acquired UPJ scarring/stenosis. Followup renal ultrasound could be compared to the prior renal ultrasound on 05/02/2009. Alternatively IV contrast-enhanced CT Abdomen and Pelvis could also prove valuable. 2. Postoperative changes from decompression and fusion at L4-L5 and L5-S1 with no adverse features. 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 3. Multifactorial mild spinal and left foraminal stenosis at L2-L3.  She reports that she has been smoking.  She started smoking about 4 years ago. She has a 15.00 pack-year smoking history. She has never used smokeless tobacco. No results for input(s): HGBA1C, LABURIC in the last 8760 hours.  Objective:  VS:  HT:    WT:   BMI:     BP:134/77  HR: bpm  TEMP: ( )  RESP:  Physical Exam  Constitutional: She is oriented to person, place, and time. She appears well-developed and well-nourished.  Eyes: Conjunctivae and EOM are normal. Pupils are equal, round, and reactive to light.  Cardiovascular: Normal rate and intact distal pulses.   Pulmonary/Chest: Effort normal.  Musculoskeletal:  The patient ambulates without aid. She has pain upon arising from a seated  position. She has a positive Fortin finger test to the right. No pain over the greater trochanters. Good distal strength.  Neurological: She is alert and oriented to person, place, and time.  Skin: Skin is warm and dry. No rash noted. No erythema.  Psychiatric: She has a normal mood and affect. Her behavior is normal.  Nursing note and vitals reviewed.   Ortho Exam Imaging: Xr C-arm No Report  Result Date: 08/20/2016 Please see Notes or Procedures tab for imaging impression.   Past Medical/Family/Surgical/Social History: Medications & Allergies reviewed per EMR Patient Active Problem List   Diagnosis Date Noted  . Chronic pain syndrome 07/13/2016  . Post laminectomy syndrome 07/13/2016  . Spinal stenosis of lumbar region with neurogenic claudication 07/13/2016  . Tobacco abuse counseling 07/19/2015  . Essential hypertension 04/02/2013  . Hyperlipidemia   . History of Morbid obesity - status post gastric bypass surgery   . Other abnormal glucose 10/25/2011  . Other malaise and fatigue 10/25/2011  . Myalgia and myositis, unspecified 10/25/2011  . Degeneration of lumbar or lumbosacral intervertebral disc 10/25/2011  . Degeneration of cervical intervertebral disc 10/25/2011  . OSA on CPAP 10/25/2011  . GERD (gastroesophageal reflux disease) 10/25/2011  . IBS (irritable bowel syndrome) 10/25/2011  . COPD (chronic obstructive pulmonary disease) ([place]) 10/25/2011  .  Other affections of shoulder region, not elsewhere classified 06/26/2011  . Post herpetic neuralgia 09/16/2003   Past Medical History:  Diagnosis Date  . Adenoma    of thyroid gland, benign  . Angina November 2011   Normal coronary angiogram; noncardiac symptom  . Anxiety   . Arthritis    osteoarthrits  . Asthma   . Barrett's esophagus   . Bronchitis   . Chronic kidney disease    cyst on right kidney hx of  . COPD (chronic obstructive pulmonary disease) ([place])   . Depression   . Diabetes mellitus   . Dysrhythmia    . Fibromyalgia   . GERD (gastroesophageal reflux disease)   . Headache(784.0)   . Heart murmur   . Hyperlipidemia   . Hypertension   . Hypothyroidism   . Morbid obesity ([place])    Status post gastric bypass surgery September 2013  . Osteopenia   . Osteoporosis   . Pneumonia   . Sleep apnea    cpap machine x 9 years   Family History  Problem Relation Age of Onset  . Mental illness Sister   . Hypertension Brother   . Heart murmur Brother   . Arrhythmia Maternal Grandmother     Atrial Fibrillation  . COPD Maternal Grandmother   . Cancer Maternal Grandfather   . Alzheimer's disease Paternal Grandmother   . COPD Paternal Grandfather   . Heart failure Paternal Grandfather   . Allergies Son   . Allergies Daughter   . Allergies Daughter   . Diabetes Father   . Hypertension Father   . Heart failure Father   . COPD Father   . Heart attack Father   . Arthritis Father   . Heart murmur Mother   . Hypothyroidism Mother   . Heart disease Mother    Past Surgical History:  Procedure Laterality Date  . BACK SURGERY     spinal fusion  . CARDIAC CATHETERIZATION  05/03/2010   No significant CAD in L coronary system, normal LV function  . CARDIOVASCULAR STRESS TEST  04/15/2007   EKG negative for ischemia, no significant ischemia demonstrated.  . COLONOSCOPY    . DILATION AND CURETTAGE OF UTERUS    . ESOPHAGOGASTRODUODENOSCOPY    . JOINT REPLACEMENT    . KNEE ARTHROPLASTY     bilaterally  . LAPAROSCOPIC ROUX-EN-Y GASTRIC BYPASS WITH UPPER ENDOSCOPY AND REMOVAL OF LAP BAND    . NASAL SINUS SURGERY     1989 and mucous cele removed  . ROTATOR CUFF REPAIR     2001  . THYROIDECTOMY    . TONSILLECTOMY     and adnoids removed  . TRANSTHORACIC ECHOCARDIOGRAM  05/03/2021   EF 55-65%, normal   Social History   Occupational History  . unemployed    Social History Main Topics  . Smoking status: Current Every Day Smoker    Packs/day: 0.50    Years: 30.00    Start date: 02/19/2012   . Smokeless tobacco: Never Used  . Alcohol use 0.0 oz/week     Comment: occassional wine  . Drug use: No  . Sexual activity: Yes

## 2016-08-20 NOTE — Patient Instructions (Signed)

## 2016-08-29 ENCOUNTER — Telehealth (INDEPENDENT_AMBULATORY_CARE_PROVIDER_SITE_OTHER): Payer: Self-pay | Admitting: Physical Medicine and Rehabilitation

## 2016-08-29 NOTE — Telephone Encounter (Signed)
See if we can get approved, she had prior RFA in [facility] x2 with documented relief and now SI joint injeciton with 50% relief

## 2016-08-31 NOTE — Telephone Encounter (Signed)
Faxed [employer] auth from with last 3 office notes to [facility]

## 2016-09-03 NOTE — Telephone Encounter (Signed)
Received fax back form [employer] stating no precert is required for cpt code [phone]. Called pt and scheduled her for 09/19/16 and 09/26/16 @ 3:30 w/driver

## 2016-09-04 ENCOUNTER — Telehealth: Payer: Self-pay | Admitting: Cardiology

## 2016-09-04 NOTE — Telephone Encounter (Signed)
Did not need this encounter °

## 2016-09-18 ENCOUNTER — Telehealth (INDEPENDENT_AMBULATORY_CARE_PROVIDER_SITE_OTHER): Payer: Self-pay

## 2016-09-18 NOTE — Telephone Encounter (Signed)
Patient left a Voice mail in Triage phone and would like to ask Dr Ernestina Patches if she should stop taking ibuprofen a day before her appt. It looks like her appt is tomorrow. Please call patient. Thank you.

## 2016-09-18 NOTE — Telephone Encounter (Signed)
Called patient and advised that she can continue to take the ibuprofen.

## 2016-09-19 ENCOUNTER — Ambulatory Visit (INDEPENDENT_AMBULATORY_CARE_PROVIDER_SITE_OTHER): Payer: BLUE CROSS/BLUE SHIELD | Admitting: Physical Medicine and Rehabilitation

## 2016-09-19 ENCOUNTER — Ambulatory Visit (INDEPENDENT_AMBULATORY_CARE_PROVIDER_SITE_OTHER): Payer: Self-pay

## 2016-09-19 ENCOUNTER — Encounter (INDEPENDENT_AMBULATORY_CARE_PROVIDER_SITE_OTHER): Payer: Self-pay | Admitting: Physical Medicine and Rehabilitation

## 2016-09-19 VITALS — BP 145/89 | HR 72

## 2016-09-19 DIAGNOSIS — M792 Neuralgia and neuritis, unspecified: Secondary | ICD-10-CM

## 2016-09-19 DIAGNOSIS — M461 Sacroiliitis, not elsewhere classified: Secondary | ICD-10-CM | POA: Diagnosis not present

## 2016-09-19 MED ORDER — CYCLOBENZAPRINE HCL 10 MG PO TABS: 10.0000 mg | ORAL_TABLET | Freq: Three times a day (TID) | ORAL | 3 refills | Status: DC | PRN

## 2016-09-19 MED ORDER — METHYLPREDNISOLONE ACETATE 80 MG/ML IJ SUSP
80.0000 mg | Freq: Once | INTRAMUSCULAR | Status: AC
  Administered 2016-09-19: 80 mg

## 2016-09-19 MED ORDER — LIDOCAINE HCL (PF) 1 % IJ SOLN
0.3300 mL | Freq: Once | INTRAMUSCULAR | Status: AC
  Administered 2016-09-19: 0.3 mL

## 2016-09-19 NOTE — Progress Notes (Signed)
Michaela Raymond - 61 y.o. female MRN [id number]  Date of birth: [birth year]  Office Visit Note: Visit Date: 09/19/2016 PCP: Nena Polio, NP Referred by: Nena Polio, NP  Subjective: Chief Complaint  Patient presents with  . Lower Back - Pain   HPI: Michaela Raymond is a 61 year old female with complicated chronic pain history. She's had lumbar fusion at L4 to the sacrum. She has been in pain management with Dr. Nelva Bush in the past. She has undergone gastric bypass surgery has lost a tremendous amount of weight since we first saw her. I initially saw her for radiofrequency ablation of the sacroiliac joint with good relief. Over the years we've seen her on a few occasions for similar complaints. We have repeated the ablation with good relief and these are documented in our past electronic record system. More recently we have looked at MRI evaluation of her lumbar spine and there is some adjacent level disease above her fusion but there is also sacroiliac joint sclerotic changes bilaterally. Her pain is essentially 70% right sided but with similar complaints on the left. These are centered over the PSIS and sacroiliac joint region. Exam today does show positive Patrick's sign. She has fell all manner of conservative care including physical therapy and chiropractic care as well as surgical fixation of lumbar spine and past medication management including opioid medicines. Her family doctor gave her a rx for cyclobenzaprine 10 mg which is helped her a great deal and she is now out and wants to know if I can refill the medication.    ROS Otherwise per HPI.  Assessment & Plan: Visit Diagnoses:  1. Neuritis   2. Sacroiliitis ([place])     Plan: Findings:  Right-sided radiofrequency ablation of the lateral branches or dorsal rami of L5-S1 and S2. This is a repeat denervation and is helped her in the past that those notes are all available for review. I did also refill her prescription for Flexeril. She  should continue to get this from her primary care physician. Depending on the relief she gets with this if it does not help very much I do think she does need spine surgery referral to look at the adjacent level disease above her fusion.    Meds & Orders:  Meds ordered this encounter  Medications  . lidocaine (PF) (XYLOCAINE) 1 % injection 0.3 mL  . methylPREDNISolone acetate (DEPO-MEDROL) injection 80 mg  . cyclobenzaprine (FLEXERIL) 10 MG tablet    Sig: Take 1 tablet (10 mg total) by mouth 3 (three) times daily as needed for muscle spasms.    Dispense:  30 tablet    Refill:  3    Orders Placed This Encounter  Procedures  . Radiofrequency,Lumbar  . XR C-ARM NO REPORT    Follow-up: Return for left side RFA.   Procedures: No procedures performed  Sacroiliac Joint Peripheral Nerve Denervation - Posterior Approach with Fluoroscopic Guidance   Patient: Michaela Raymond      Date of Birth: Oct 01, 1955 MRN: [id number] PCP: Nena Polio, NP      Visit Date: 09/19/2016   Universal Protocol:    Date/Time: 04/06/185:54 AM  Consent Given By: the patient  Position: PRONE  Additional Comments: Vital signs were monitored before and after the procedure. Patient was prepped and draped in the usual sterile fashion. The correct patient, procedure, and site was verified.   Injection Procedure Details:  Procedure Site One Meds Administered:  Meds ordered this encounter  Medications  . lidocaine (PF) (XYLOCAINE) 1 %  injection 0.3 mL  . methylPREDNISolone acetate (DEPO-MEDROL) injection 80 mg  . cyclobenzaprine (FLEXERIL) 10 MG tablet    Sig: Take 1 tablet (10 mg total) by mouth 3 (three) times daily as needed for muscle spasms.    Dispense:  30 tablet    Refill:  3     Laterality: Right  Location/Site:  L5-S1 S1-2 S2-3  Needle size: 18 G  Needle type: Stryker RF cannula  Findings:  -Contrast Used: N/A   -Comments: Excellent placement of the RF cannulas as described below  with good sensorimotor stimulation.  Procedure Details: For the L5, S1 and S2 dorsal rami the fluoroscope was positioned to square off the sacrum or sacral ala to achieve a true AP midline view.  For the L5 dorsal rami the beam was then obliqued 15 to 20 degrees and caudally tilted 15 to 20 degrees to line up a trajectory along the target nerve. The skin over the target of the junction of superior articulating process and sacral ala was infiltrated with local anesthetic.  The 18 gauge 24mm active tip outer cannula was advanced in trajectory view to the target. The outer cannula placement was fine-tuned and the position was then confirmed with bi-planar imaging.  For the S1 and S2 dorsal rami, no obliquity was utilized but the same caudal tilt was used.  Three targets corresponding to the 12 o'clock and 6 o'clock and the lateral mid-line positions around the foramen were  visualized. The 18 gauge 8mm active tip outer cannula was advanced in trajectory view to each target. The outer cannula placement was fine-tuned and the position was then confirmed with bi-planar imaging  This procedure was repeated for each target nerve position.  Then, for all levels, the outer cannula placement was fine-tuned and the position was then confirmed with bi-planar imaging.    Test stimulation was done both at sensory and motor levels to ensure there was no radicular stimulation. The target tissues were then infiltrated with 1 ml of 1% Lidocaine without Epinephrine. Subsequently, a percutaneous neurotomy was carried out for 60 seconds at 80 degrees Celsius. The procedure was repeated with the cannula rotated 90 degrees, for duration of 60 seconds, one additional time at each level for a total of two lesions at the L5 dorsal rami.  One 60 second neurotomy was performed for each target around the S1 and S2 foramen. After the completion of the respective lesions, 1 ml of injectate was delivered.  Appropriate radiographs were  obtained to verify the probe placement during the neurotomy.  Additional Comments:  The patient tolerated the procedure well Dressing: Band-Aid    Post-procedure details: Patient was observed during the procedure. Post-procedure instructions were reviewed. Patient left the clinic in stable condition.     Clinical History: Lumbar spine MRI 09/12/2014 IMPRESSION: 1. Enlarged right renal collecting system since 2011 compatible with hydronephrosis. No hydroureter identified, perhaps this represents acquired UPJ scarring/stenosis. Followup renal ultrasound could be compared to the prior renal ultrasound on 05/02/2009. Alternatively IV contrast-enhanced CT Abdomen and Pelvis could also prove valuable. 2. Postoperative changes from decompression and fusion at L4-L5 and L5-S1 with no adverse features. 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 3. Multifactorial mild spinal and left foraminal stenosis at L2-L3.  She reports that she has been smoking.  She started smoking about 4 years ago. She has a 15.00 pack-year smoking history. She has never used smokeless tobacco. No results for input(s): HGBA1C, LABURIC in  the last 8760 hours.  Objective:  VS:  HT:    WT:   BMI:     BP:(!) 145/89  HR:72bpm  TEMP: ( )  RESP:98 % Physical Exam  Musculoskeletal:  Patient ambulates without aid. She arises slowly from a seated position. She does have a positive Fortin finger sign on the right side as well as positive Patrick's test.    Ortho Exam Imaging: No results found.  Past Medical/Family/Surgical/Social History: Medications & Allergies reviewed per EMR Patient Active Problem List   Diagnosis Date Noted  . Chronic pain syndrome 07/13/2016  . Post laminectomy syndrome 07/13/2016  . Spinal stenosis of lumbar region with neurogenic claudication 07/13/2016  . Tobacco abuse counseling 07/19/2015  . Essential hypertension  04/02/2013  . Hyperlipidemia   . History of Morbid obesity - status post gastric bypass surgery   . Other abnormal glucose 10/25/2011  . Other malaise and fatigue 10/25/2011  . Myalgia and myositis, unspecified 10/25/2011  . Degeneration of lumbar or lumbosacral intervertebral disc 10/25/2011  . Degeneration of cervical intervertebral disc 10/25/2011  . OSA on CPAP 10/25/2011  . GERD (gastroesophageal reflux disease) 10/25/2011  . IBS (irritable bowel syndrome) 10/25/2011  . COPD (chronic obstructive pulmonary disease) ([place]) 10/25/2011  . Other affections of shoulder region, not elsewhere classified 06/26/2011  . Post herpetic neuralgia 09/16/2003   Past Medical History:  Diagnosis Date  . Adenoma    of thyroid gland, benign  . Angina November 2011   Normal coronary angiogram; noncardiac symptom  . Anxiety   . Arthritis    osteoarthrits  . Asthma   . Barrett's esophagus   . Bronchitis   . Chronic kidney disease    cyst on right kidney hx of  . COPD (chronic obstructive pulmonary disease) ([place])   . Depression   . Diabetes mellitus   . Dysrhythmia   . Fibromyalgia   . GERD (gastroesophageal reflux disease)   . Headache(784.0)   . Heart murmur   . Hyperlipidemia   . Hypertension   . Hypothyroidism   . Morbid obesity ([place])    Status post gastric bypass surgery September 2013  . Osteopenia   . Osteoporosis   . Pneumonia   . Sleep apnea    cpap machine x 9 years   Family History  Problem Relation Age of Onset  . Mental illness Sister   . Hypertension Brother   . Heart murmur Brother   . Arrhythmia Maternal Grandmother     Atrial Fibrillation  . COPD Maternal Grandmother   . Cancer Maternal Grandfather   . Alzheimer's disease Paternal Grandmother   . COPD Paternal Grandfather   . Heart failure Paternal Grandfather   . Allergies Son   . Allergies Daughter   . Allergies Daughter   . Diabetes Father   . Hypertension Father   . Heart failure Father   . COPD  Father   . Heart attack Father   . Arthritis Father   . Heart murmur Mother   . Hypothyroidism Mother   . Heart disease Mother    Past Surgical History:  Procedure Laterality Date  . BACK SURGERY     spinal fusion  . CARDIAC CATHETERIZATION  05/03/2010   No significant CAD in L coronary system, normal LV function  . CARDIOVASCULAR STRESS TEST  04/15/2007   EKG negative for ischemia, no significant ischemia demonstrated.  . COLONOSCOPY    . DILATION AND CURETTAGE OF UTERUS    . ESOPHAGOGASTRODUODENOSCOPY    .  JOINT REPLACEMENT    . KNEE ARTHROPLASTY     bilaterally  . LAPAROSCOPIC ROUX-EN-Y GASTRIC BYPASS WITH UPPER ENDOSCOPY AND REMOVAL OF LAP BAND    . NASAL SINUS SURGERY     1989 and mucous cele removed  . ROTATOR CUFF REPAIR     2001  . THYROIDECTOMY    . TONSILLECTOMY     and adnoids removed  . TRANSTHORACIC ECHOCARDIOGRAM  05/03/2021   EF 55-65%, normal   Social History   Occupational History  . unemployed    Social History Main Topics  . Smoking status: Current Every Day Smoker    Packs/day: 0.50    Years: 30.00    Start date: 02/19/2012  . Smokeless tobacco: Never Used  . Alcohol use 0.0 oz/week     Comment: occassional wine  . Drug use: No  . Sexual activity: Yes

## 2016-09-19 NOTE — Patient Instructions (Signed)

## 2016-09-21 ENCOUNTER — Encounter: Payer: Self-pay | Admitting: Cardiology

## 2016-09-21 ENCOUNTER — Ambulatory Visit (INDEPENDENT_AMBULATORY_CARE_PROVIDER_SITE_OTHER): Payer: BLUE CROSS/BLUE SHIELD | Admitting: Cardiology

## 2016-09-21 VITALS — BP 122/80 | HR 67 | Ht 65.0 in | Wt 146.0 lb

## 2016-09-21 DIAGNOSIS — E784 Other hyperlipidemia: Secondary | ICD-10-CM

## 2016-09-21 DIAGNOSIS — Z716 Tobacco abuse counseling: Secondary | ICD-10-CM | POA: Diagnosis not present

## 2016-09-21 DIAGNOSIS — E7849 Other hyperlipidemia: Secondary | ICD-10-CM

## 2016-09-21 DIAGNOSIS — I10 Essential (primary) hypertension: Secondary | ICD-10-CM

## 2016-09-21 NOTE — Procedures (Signed)
Sacroiliac Joint Peripheral Nerve Denervation - Posterior Approach with Fluoroscopic Guidance   Patient: Michaela Raymond      Date of Birth: 1955-10-29 MRN: [id number] PCP: Nena Polio, NP      Visit Date: 09/19/2016   Universal Protocol:    Date/Time: 04/06/185:54 AM  Consent Given By: the patient  Position: PRONE  Additional Comments: Vital signs were monitored before and after the procedure. Patient was prepped and draped in the usual sterile fashion. The correct patient, procedure, and site was verified.   Injection Procedure Details:  Procedure Site One Meds Administered:  Meds ordered this encounter  Medications  . lidocaine (PF) (XYLOCAINE) 1 % injection 0.3 mL  . methylPREDNISolone acetate (DEPO-MEDROL) injection 80 mg  . cyclobenzaprine (FLEXERIL) 10 MG tablet    Sig: Take 1 tablet (10 mg total) by mouth 3 (three) times daily as needed for muscle spasms.    Dispense:  30 tablet    Refill:  3     Laterality: Right  Location/Site:  L5-S1 S1-2 S2-3  Needle size: 18 G  Needle type: Stryker RF cannula  Findings:  -Contrast Used: N/A   -Comments: Excellent placement of the RF cannulas as described below with good sensorimotor stimulation.  Procedure Details: For the L5, S1 and S2 dorsal rami the fluoroscope was positioned to square off the sacrum or sacral ala to achieve a true AP midline view.  For the L5 dorsal rami the beam was then obliqued 15 to 20 degrees and caudally tilted 15 to 20 degrees to line up a trajectory along the target nerve. The skin over the target of the junction of superior articulating process and sacral ala was infiltrated with local anesthetic.  The 18 gauge 46mm active tip outer cannula was advanced in trajectory view to the target. The outer cannula placement was fine-tuned and the position was then confirmed with bi-planar imaging.  For the S1 and S2 dorsal rami, no obliquity was utilized but the same caudal tilt was used.  Three  targets corresponding to the 12 o'clock and 6 o'clock and the lateral mid-line positions around the foramen were  visualized. The 18 gauge 60mm active tip outer cannula was advanced in trajectory view to each target. The outer cannula placement was fine-tuned and the position was then confirmed with bi-planar imaging  This procedure was repeated for each target nerve position.  Then, for all levels, the outer cannula placement was fine-tuned and the position was then confirmed with bi-planar imaging.    Test stimulation was done both at sensory and motor levels to ensure there was no radicular stimulation. The target tissues were then infiltrated with 1 ml of 1% Lidocaine without Epinephrine. Subsequently, a percutaneous neurotomy was carried out for 60 seconds at 80 degrees Celsius. The procedure was repeated with the cannula rotated 90 degrees, for duration of 60 seconds, one additional time at each level for a total of two lesions at the L5 dorsal rami.  One 60 second neurotomy was performed for each target around the S1 and S2 foramen. After the completion of the respective lesions, 1 ml of injectate was delivered.  Appropriate radiographs were obtained to verify the probe placement during the neurotomy.  Additional Comments:  The patient tolerated the procedure well Dressing: Band-Aid    Post-procedure details: Patient was observed during the procedure. Post-procedure instructions were reviewed. Patient left the clinic in stable condition.

## 2016-09-21 NOTE — Patient Instructions (Signed)
PLEASE CALL BACK WITH - WHAT YOUR DOSE OF LOSARTAN ( COZAAR)   THEN WE WILL PRESCRIPTION INTO [facility] FOR A YEARS SUPPLY   NO OTHER CHANGES    Your physician recommends that you schedule a follow-up appointment in as needed  primary to fill medication after this year.

## 2016-09-21 NOTE — Progress Notes (Signed)
PCP: Nena Polio, NP  Clinic Note: Chief Complaint  Patient presents with  . 12 month visit    pt states no Sx.    HPI: Michaela Raymond is a 61 y.o. female with a PMH below who presents today for Slightly delayed 1 year follow-up. She has a history of cardiac risk factors including hypertension, smoking and previously being morbidly obese now with significant weight loss after gastric bypass surgery. She also has significant aortic calcification/atherosclerosis noted.  Michaela Raymond was last seen on she was last seen in late January 2017. She was pressure cardiac standpoint. She noted occasional palpitations and twinging chest discomfort episodes. In the past we have tried to break up her Cozaar 25 mg twice a day she vomiting went back to 50 mg daily.  Recent Hospitalizations: None  Studies Reviewed: None  Interval History: Jayliani comes in for routine follow-up feeling quite well. She is back to exercising. She is training to ride a bicycle ride in September. She is routinely exercising and is not having any is back adjusting her diet.  Cardiovascular Review of Symptoms:  No chest pain or shortness of breath with rest or exertion.  No PND, orthopnea or edema.  No palpitations, lightheadedness, dizziness, weakness or syncope/near syncope. No TIA/amaurosis fugax symptoms. No melena, hematochezia, hematuria, or epstaxis. No claudication.  ROS: A comprehensive was performed. Review of Systems  Unable to perform ROS: Acuity of condition  Respiratory: Positive for cough (Occasional smoker's cough. She is working on fully quitting.). Negative for shortness of breath and wheezing.   Gastrointestinal: Positive for heartburn.  Musculoskeletal: Positive for back pain.  All other systems reviewed and are negative.   Past Medical History:  Diagnosis Date  . Adenoma    of thyroid gland, benign  . Angina November 2011   Normal coronary angiogram; noncardiac symptom  . Anxiety   .  Arthritis    osteoarthrits  . Asthma   . Barrett's esophagus   . Bronchitis   . Chronic kidney disease    cyst on right kidney hx of  . COPD (chronic obstructive pulmonary disease) ([place])   . Depression   . Diabetes mellitus   . Dysrhythmia   . Fibromyalgia   . GERD (gastroesophageal reflux disease)   . Headache(784.0)   . Heart murmur   . Hyperlipidemia   . Hypertension   . Hypothyroidism   . Morbid obesity ([place])    Status post gastric bypass surgery September 2013  . Osteopenia   . Osteoporosis   . Pneumonia   . Sleep apnea    cpap machine x 9 years    Past Surgical History:  Procedure Laterality Date  . BACK SURGERY     spinal fusion  . CARDIAC CATHETERIZATION  05/03/2010   No significant CAD in L coronary system, normal LV function  . CARDIOVASCULAR STRESS TEST  04/15/2007   EKG negative for ischemia, no significant ischemia demonstrated.  . COLONOSCOPY    . DILATION AND CURETTAGE OF UTERUS    . ESOPHAGOGASTRODUODENOSCOPY    . JOINT REPLACEMENT    . KNEE ARTHROPLASTY     bilaterally  . LAPAROSCOPIC ROUX-EN-Y GASTRIC BYPASS WITH UPPER ENDOSCOPY AND REMOVAL OF LAP BAND    . NASAL SINUS SURGERY     1989 and mucous cele removed  . ROTATOR CUFF REPAIR     2001  . THYROIDECTOMY    . TONSILLECTOMY     and adnoids removed  . TRANSTHORACIC ECHOCARDIOGRAM  05/03/2021  EF 55-65%, normal    Current Meds  Medication Sig  . acetaminophen (TYLENOL 8 HOUR) 650 MG CR tablet Take 1,300 mg by mouth 2 (two) times daily.  Marland Kitchen alosetron (LOTRONEX) 0.5 MG tablet Take 0.5 mg by mouth at bedtime.    Marland Kitchen amoxicillin (AMOXIL) 250 MG/5ML suspension Take 500 mg by mouth as needed (for dental procedures.).  Marland Kitchen Biotin 1000 MCG tablet Take 1,000 mcg by mouth 3 (three) times daily.  . butalbital-acetaminophen-caffeine (FIORICET, ESGIC) 50-325-40 MG per tablet Take 1 tablet by mouth as needed.  . calcium citrate-vitamin D (CITRACAL+D) 315-200 MG-UNIT tablet Take 4 tablets by mouth once.  .  Cholecalciferol (VITAMIN D3) 2000 units TABS Take by mouth 2 (two) times daily.  Marland Kitchen CLIMARA PRO 0.045-0.015 MG/DAY Place 1 patch onto the skin once a week.  . cyclobenzaprine (FLEXERIL) 10 MG tablet Take 1 tablet (10 mg total) by mouth 3 (three) times daily as needed for muscle spasms.  . fluticasone (FLONASE) 50 MCG/ACT nasal spray Place 2 sprays into both nostrils daily. PATIENT NEEDS OFFICE VISIT FOR ADDITIONAL REFILLS  . gabapentin (NEURONTIN) 100 MG capsule Take 100 mg by mouth 2 (two) times daily. 100mg  each morning, 300mg  each evening  . levocetirizine (XYZAL) 5 MG tablet Take 1 tablet by mouth daily.  Marland Kitchen levothyroxine (SYNTHROID, LEVOTHROID) 137 MCG tablet Take 137 mcg by mouth every morning.    . lidocaine (LIDODERM) 5 % Place 1-2 patches onto the skin daily. Remove & Discard patch within 12 hours. PATIENT NEEDS OFFICE VISIT FOR MORE  . losartan (COZAAR) 25 MG tablet Take 50 mg by mouth daily.   . montelukast (SINGULAIR) 10 MG tablet Take 1 tablet (10 mg total) by mouth at bedtime. PATIENT NEEDS OFFICE VISIT FOR ADDITIONAL REFILLS - 2nd NOTICE  . Multiple Vitamin (MULTIVITAMIN) tablet Take 1 tablet by mouth daily.  . pantoprazole (PROTONIX) 20 MG tablet Take 1 tablet by mouth daily.    Allergies  Allergen Reactions  . Sulfa Antibiotics Anaphylaxis, Hives, Itching and Swelling  . Asa [Aspirin]     GI upset  . Coreg   . Escitalopram Oxalate     Panic attack   . Fenofibrate   . Ibuprofen   . Talwin [Pentazocine Lactate] Nausea And Vomiting  . Tape Itching    Hives. Only use paper or cloth tape  . Verapamil   . Zithromax [Azithromycin]     Severe Abdominal cramping    Social History   Social History  . Marital status: Married    Spouse name: N/A  . Number of children: 3  . Years of education: N/A   Occupational History  . unemployed    Social History Main Topics  . Smoking status: Current Every Day Smoker    Packs/day: 0.50    Years: 30.00    Start date: 02/19/2012    . Smokeless tobacco: Never Used  . Alcohol use 0.0 oz/week     Comment: occassional wine  . Drug use: No  . Sexual activity: Yes   Other Topics Concern  . None   Social History Narrative   She is a separated mother of 3.  2 children from her first marriage and one from the second one.  She has one grandchild.  He she quit smoking in September 2013.  She does not drink.  She exercises at least 2-3 days a week doing either swimming biking or running.    family history includes Allergies in her daughter, daughter, and son; Alzheimer's disease  in her paternal grandmother; Arrhythmia in her maternal grandmother; Arthritis in her father; COPD in her father, maternal grandmother, and paternal grandfather; Cancer in her maternal grandfather; Diabetes in her father; Heart attack in her father; Heart disease in her mother; Heart failure in her father and paternal grandfather; Heart murmur in her brother and mother; Hypertension in her brother and father; Hypothyroidism in her mother; Mental illness in her sister.  Wt Readings from Last 3 Encounters:  09/21/16 146 lb (66.2 kg)  07/19/15 144 lb (65.3 kg)  11/22/14 136 lb (61.7 kg)    PHYSICAL EXAM BP 122/80 (BP Location: Left Arm, Patient Position: Sitting, Cuff Size: Normal)   Pulse 67   Ht 5\' 5"  (1.651 m)   Wt 146 lb (66.2 kg)   BMI 24.30 kg/m  General appearance: alert, cooperative, appears stated age, no distress; thin, healthy-appearing Neck: no adenopathy, no carotid bruit and no JVD Lungs: clear to auscultation bilaterally, normal percussion bilaterally and non-labored Heart: regular rate and rhythm, S1, S2 normal, soft 1/6 SEM at the RUSB. Also soft S4., click, rub or gallop; nondisplaced PMI Abdomen: soft, non-tender; bowel sounds normal; no masses, no organomegaly; residual skin status post weight loss Extremities: extremities normal, atraumatic, no cyanosis or edema Pulses: 2+ and symmetric; Neurologic: Mental status: Alert,  oriented, thought content appropriate    Adult ECG Report  Rate: 67 ;  Rhythm: normal sinus rhythm; Incomplete right bundle branch block. Otherwise normal axis and intervals and durations.  Narrative Interpretation: Stable EKG rate somewhat increased from last check.  Other studies Reviewed: Additional studies/ records that were reviewed today include:  Recent Labs:  n/a  ASSESSMENT / PLAN: Problem List Items Addressed This Visit    Essential hypertension - Primary (Chronic)    Well-controlled on current dose of ARB.      Relevant Orders   EKG 12-Lead   History of Morbid obesity - status post gastric bypass surgery (Chronic)   Relevant Orders   EKG 12-Lead   Hyperlipidemia (Chronic)    She had been on a statin in the past, no longer present. Has not had lipids checked recently. These are being monitored by PCP.      Relevant Orders   EKG 12-Lead   Tobacco abuse counseling (Chronic)    Slowly, but surely she is reducing her smoking intake.         Current medicines are reviewed at length with the patient today. (+/- concerns) n/a - just a bit concerned about her insurance coverage once her pending divorce is finalized  The following changes have been made: n/a  Patient Instructions  PLEASE [place] ( [place])   [place]    Your physician recommends that you schedule a follow-up appointment in as needed  primary to fill medication after this year.    Studies Ordered:   Orders Placed This Encounter  Procedures  . EKG 12-Lead      Glenetta Hew, M.D., M.S. Interventional Cardiologist   Pager # [phone] Phone # [phone] [address] [place], [place] [address]

## 2016-09-23 NOTE — Assessment & Plan Note (Signed)
Slowly, but surely she is reducing her smoking intake.

## 2016-09-23 NOTE — Assessment & Plan Note (Signed)
She had been on a statin in the past, no longer present. Has not had lipids checked recently. These are being monitored by PCP.

## 2016-09-23 NOTE — Assessment & Plan Note (Signed)
Well-controlled on current dose of ARB.

## 2016-09-26 ENCOUNTER — Ambulatory Visit (INDEPENDENT_AMBULATORY_CARE_PROVIDER_SITE_OTHER): Payer: Self-pay

## 2016-09-26 ENCOUNTER — Ambulatory Visit (INDEPENDENT_AMBULATORY_CARE_PROVIDER_SITE_OTHER): Payer: BLUE CROSS/BLUE SHIELD | Admitting: Physical Medicine and Rehabilitation

## 2016-09-26 ENCOUNTER — Encounter (INDEPENDENT_AMBULATORY_CARE_PROVIDER_SITE_OTHER): Payer: Self-pay | Admitting: Physical Medicine and Rehabilitation

## 2016-09-26 VITALS — BP 138/87 | HR 68

## 2016-09-26 DIAGNOSIS — M461 Sacroiliitis, not elsewhere classified: Secondary | ICD-10-CM | POA: Diagnosis not present

## 2016-09-26 MED ORDER — LIDOCAINE HCL (PF) 1 % IJ SOLN: 0.3300 mL | Freq: Once | INTRAMUSCULAR | Status: DC

## 2016-09-26 MED ORDER — METHYLPREDNISOLONE ACETATE 80 MG/ML IJ SUSP: 80.0000 mg | Freq: Once | INTRAMUSCULAR | Status: AC

## 2016-09-26 NOTE — Patient Instructions (Signed)

## 2016-09-26 NOTE — Progress Notes (Signed)
Michaela Raymond - 61 y.o. female MRN [id number]  Date of birth: [birth year]  Office Visit Note: Visit Date: 09/26/2016 PCP: Michaela Polio, NP Referred by: Michaela Polio, NP  Subjective: Chief Complaint  Patient presents with  . Lower Back - Pain   HPI: Michaela Raymond is a 61 year old female with complicated spine history including lumbar fusion to the sacrum with chronic worsening bilateral buttock and thigh pain. She does have adjacent level disease above the fusion. She has a history of chronic pain management with opioids but is currently not taking any. Please see our prior notes for justification we are going to complete radiofrequency ablation of the left L5 dorsal rami and lateral branches of S1-S2 to denervate the sacroiliac joint.    ROS Otherwise per HPI.  Assessment & Plan: Visit Diagnoses:  1. Sacroiliitis ([place])     Plan: Findings:  Radiofrequency ablation of the left L5 dorsal rami and left S1 and S2 lateral branches to denervate the left sacroiliac joint.    Meds & Orders:  Meds ordered this encounter  Medications  . lidocaine (PF) (XYLOCAINE) 1 % injection 0.3 mL  . methylPREDNISolone acetate (DEPO-MEDROL) injection 80 mg    Orders Placed This Encounter  Procedures  . Radiofrequency,Lumbar  . XR C-ARM NO REPORT    Follow-up: Return if symptoms worsen or fail to improve.   Procedures: No procedures performed  Sacroiliac Joint Peripheral Nerve Denervation - Posterior Approach with Fluoroscopic Guidance   Patient: Michaela Raymond      Date of Birth: 04-Sep-1955 MRN: [id number] PCP: Michaela Polio, NP      Visit Date: 09/26/2016   Universal Protocol:    Date/Time: 04/14/187:46 AM  Consent Given By: the patient  Position: PRONE  Additional Comments: Vital signs were monitored before and after the procedure. Patient was prepped and draped in the usual sterile fashion. The correct patient, procedure, and site was verified.   Injection Procedure  Details:  Procedure Site One Meds Administered:  Meds ordered this encounter  Medications  . lidocaine (PF) (XYLOCAINE) 1 % injection 0.3 mL  . methylPREDNISolone acetate (DEPO-MEDROL) injection 80 mg     Laterality: Left  Location/Site:  L5-S1 S1-2 S2-3  Needle size: 18 G  Needle type: Stryker radiofrequency cannula  Needle Placement: As below  Findings:  -Contrast Used: N/A   -Comments: Biplanar imaging showed good placement of the cannulas.  Procedure Details: For the L5, S1 and S2 dorsal rami the fluoroscope was positioned to square off the sacrum or sacral ala to achieve a true AP midline view.  For the L5 dorsal rami the beam was then obliqued 15 to 20 degrees and caudally tilted 15 to 20 degrees to line up a trajectory along the target nerve. The skin over the target of the junction of superior articulating process and sacral ala was infiltrated with local anesthetic.  The 18 gauge 48mm active tip outer cannula was advanced in trajectory view to the target. The outer cannula placement was fine-tuned and the position was then confirmed with bi-planar imaging.  For the S1 and S2 dorsal rami, no obliquity was utilized but the same caudal tilt was used.  Three targets corresponding to the 12 o'clock and 6 o'clock and the lateral mid-line positions around the foramen were  visualized. The 18 gauge 75mm active tip outer cannula was advanced in trajectory view to each target. The outer cannula placement was fine-tuned and the position was then confirmed with bi-planar imaging  This procedure was repeated for  each target nerve position.  Then, for all levels, the outer cannula placement was fine-tuned and the position was then confirmed with bi-planar imaging.    Test stimulation was done both at sensory and motor levels to ensure there was no radicular stimulation. The target tissues were then infiltrated with 1 ml of 1% Lidocaine without Epinephrine. Subsequently, a percutaneous  neurotomy was carried out for 60 seconds at 80 degrees Celsius. The procedure was repeated with the cannula rotated 90 degrees, for duration of 60 seconds, one additional time at each level for a total of two lesions at the L5 dorsal rami.  One 60 second neurotomy was performed for each target around the S1 and S2 foramen. After the completion of the respective lesions, 1 ml of injectate was delivered.  Appropriate radiographs were obtained to verify the probe placement during the neurotomy.  Additional Comments:  The patient tolerated the procedure well Dressing: Band-Aid    Post-procedure details: Patient was observed during the procedure. Post-procedure instructions were reviewed.  Patient left the clinic in stable condition.     Clinical History: Lumbar spine MRI 09/12/2014 IMPRESSION: 1. Enlarged right renal collecting system since 2011 compatible with hydronephrosis. No hydroureter identified, perhaps this represents acquired UPJ scarring/stenosis. Followup renal ultrasound could be compared to the prior renal ultrasound on 05/02/2009. Alternatively IV contrast-enhanced CT Abdomen and Pelvis could also prove valuable. 2. Postoperative changes from decompression and fusion at L4-L5 and L5-S1 with no adverse features. 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 3. Multifactorial mild spinal and left foraminal stenosis at L2-L3.  She reports that she has been smoking.  She started smoking about 4 years ago. She has a 15.00 pack-year smoking history. She has never used smokeless tobacco. No results for input(s): HGBA1C, LABURIC in the last 8760 hours.  Objective:  VS:  HT:    WT:   BMI:     BP:138/87  HR:68bpm  TEMP: ( )  RESP:96 % Physical Exam  Musculoskeletal:  Patient ambulates without aid with good distal strength.    Ortho Exam Imaging: No results found.  Past Medical/Family/Surgical/Social  History: Medications & Allergies reviewed per EMR Patient Active Problem List   Diagnosis Date Noted  . Chronic pain syndrome 07/13/2016  . Post laminectomy syndrome 07/13/2016  . Spinal stenosis of lumbar region with neurogenic claudication 07/13/2016  . Tobacco abuse counseling 07/19/2015  . Essential hypertension 04/02/2013  . Hyperlipidemia   . History of Morbid obesity - status post gastric bypass surgery   . Other abnormal glucose 10/25/2011  . Other malaise and fatigue 10/25/2011  . Myalgia and myositis, unspecified 10/25/2011  . Degeneration of lumbar or lumbosacral intervertebral disc 10/25/2011  . Degeneration of cervical intervertebral disc 10/25/2011  . OSA on CPAP 10/25/2011  . GERD (gastroesophageal reflux disease) 10/25/2011  . IBS (irritable bowel syndrome) 10/25/2011  . COPD (chronic obstructive pulmonary disease) ([place]) 10/25/2011  . Other affections of shoulder region, not elsewhere classified 06/26/2011  . Post herpetic neuralgia 09/16/2003   Past Medical History:  Diagnosis Date  . Adenoma    of thyroid gland, benign  . Angina November 2011   Normal coronary angiogram; noncardiac symptom  . Anxiety   . Arthritis    osteoarthrits  . Asthma   . Barrett's esophagus   . Bronchitis   . Chronic kidney disease    cyst on right kidney hx of  . COPD (chronic obstructive pulmonary disease) ([place])   . Depression   .  Diabetes mellitus   . Dysrhythmia   . Fibromyalgia   . GERD (gastroesophageal reflux disease)   . Headache(784.0)   . Heart murmur   . Hyperlipidemia   . Hypertension   . Hypothyroidism   . Morbid obesity ([place])    Status post gastric bypass surgery September 2013  . Osteopenia   . Osteoporosis   . Pneumonia   . Sleep apnea    cpap machine x 9 years   Family History  Problem Relation Age of Onset  . Mental illness Sister   . Hypertension Brother   . Heart murmur Brother   . Arrhythmia Maternal Grandmother     Atrial Fibrillation  .  COPD Maternal Grandmother   . Cancer Maternal Grandfather   . Alzheimer's disease Paternal Grandmother   . COPD Paternal Grandfather   . Heart failure Paternal Grandfather   . Allergies Son   . Allergies Daughter   . Allergies Daughter   . Diabetes Father   . Hypertension Father   . Heart failure Father   . COPD Father   . Heart attack Father   . Arthritis Father   . Heart murmur Mother   . Hypothyroidism Mother   . Heart disease Mother    Past Surgical History:  Procedure Laterality Date  . BACK SURGERY     spinal fusion  . CARDIAC CATHETERIZATION  05/03/2010   No significant CAD in L coronary system, normal LV function  . CARDIOVASCULAR STRESS TEST  04/15/2007   EKG negative for ischemia, no significant ischemia demonstrated.  . COLONOSCOPY    . DILATION AND CURETTAGE OF UTERUS    . ESOPHAGOGASTRODUODENOSCOPY    . JOINT REPLACEMENT    . KNEE ARTHROPLASTY     bilaterally  . LAPAROSCOPIC ROUX-EN-Y GASTRIC BYPASS WITH UPPER ENDOSCOPY AND REMOVAL OF LAP BAND    . NASAL SINUS SURGERY     1989 and mucous cele removed  . ROTATOR CUFF REPAIR     2001  . THYROIDECTOMY    . TONSILLECTOMY     and adnoids removed  . TRANSTHORACIC ECHOCARDIOGRAM  05/03/2021   EF 55-65%, normal   Social History   Occupational History  . unemployed    Social History Main Topics  . Smoking status: Current Every Day Smoker    Packs/day: 0.50    Years: 30.00    Start date: 02/19/2012  . Smokeless tobacco: Never Used  . Alcohol use 0.0 oz/week     Comment: occassional wine  . Drug use: No  . Sexual activity: Yes

## 2016-09-29 NOTE — Procedures (Signed)
Sacroiliac Joint Peripheral Nerve Denervation - Posterior Approach with Fluoroscopic Guidance   Patient: Michaela Raymond      Date of Birth: 1956-05-09 MRN: [id number] PCP: Nena Polio, NP      Visit Date: 09/26/2016   Universal Protocol:    Date/Time: 04/14/187:46 AM  Consent Given By: the patient  Position: PRONE  Additional Comments: Vital signs were monitored before and after the procedure. Patient was prepped and draped in the usual sterile fashion. The correct patient, procedure, and site was verified.   Injection Procedure Details:  Procedure Site One Meds Administered:  Meds ordered this encounter  Medications  . lidocaine (PF) (XYLOCAINE) 1 % injection 0.3 mL  . methylPREDNISolone acetate (DEPO-MEDROL) injection 80 mg     Laterality: Left  Location/Site:  L5-S1 S1-2 S2-3  Needle size: 18 G  Needle type: Stryker radiofrequency cannula  Needle Placement: As below  Findings:  -Contrast Used: N/A   -Comments: Biplanar imaging showed good placement of the cannulas.  Procedure Details: For the L5, S1 and S2 dorsal rami the fluoroscope was positioned to square off the sacrum or sacral ala to achieve a true AP midline view.  For the L5 dorsal rami the beam was then obliqued 15 to 20 degrees and caudally tilted 15 to 20 degrees to line up a trajectory along the target nerve. The skin over the target of the junction of superior articulating process and sacral ala was infiltrated with local anesthetic.  The 18 gauge 58mm active tip outer cannula was advanced in trajectory view to the target. The outer cannula placement was fine-tuned and the position was then confirmed with bi-planar imaging.  For the S1 and S2 dorsal rami, no obliquity was utilized but the same caudal tilt was used.  Three targets corresponding to the 12 o'clock and 6 o'clock and the lateral mid-line positions around the foramen were  visualized. The 18 gauge 60mm active tip outer cannula was  advanced in trajectory view to each target. The outer cannula placement was fine-tuned and the position was then confirmed with bi-planar imaging  This procedure was repeated for each target nerve position.  Then, for all levels, the outer cannula placement was fine-tuned and the position was then confirmed with bi-planar imaging.    Test stimulation was done both at sensory and motor levels to ensure there was no radicular stimulation. The target tissues were then infiltrated with 1 ml of 1% Lidocaine without Epinephrine. Subsequently, a percutaneous neurotomy was carried out for 60 seconds at 80 degrees Celsius. The procedure was repeated with the cannula rotated 90 degrees, for duration of 60 seconds, one additional time at each level for a total of two lesions at the L5 dorsal rami.  One 60 second neurotomy was performed for each target around the S1 and S2 foramen. After the completion of the respective lesions, 1 ml of injectate was delivered.  Appropriate radiographs were obtained to verify the probe placement during the neurotomy.  Additional Comments:  The patient tolerated the procedure well Dressing: Band-Aid    Post-procedure details: Patient was observed during the procedure. Post-procedure instructions were reviewed.  Patient left the clinic in stable condition.

## 2016-10-24 ENCOUNTER — Telehealth (INDEPENDENT_AMBULATORY_CARE_PROVIDER_SITE_OTHER): Payer: Self-pay | Admitting: Physical Medicine and Rehabilitation

## 2016-10-25 NOTE — Telephone Encounter (Signed)
Cyndia Diver, MD does surgery at [facility] and Jenne Campus MD at [facility], she can look up and let us know and we can make referral. They are both good

## 2016-10-25 NOTE — Telephone Encounter (Signed)
Called patient and she will call us back Friday.

## 2016-10-25 NOTE — Telephone Encounter (Signed)
She needs follow up with Spine surgeon as talked to her before because of sever stenosis above her fusion.

## 2016-10-25 NOTE — Telephone Encounter (Signed)
Patient said you mentioned a surgeon at [facility], but she can't remember the name. She would like a referral there if possible.

## 2016-11-21 NOTE — Telephone Encounter (Signed)
Patient has not called back. Closing call.

## 2017-01-18 ENCOUNTER — Telehealth (INDEPENDENT_AMBULATORY_CARE_PROVIDER_SITE_OTHER): Payer: Self-pay | Admitting: Physical Medicine and Rehabilitation

## 2017-01-18 NOTE — Telephone Encounter (Signed)
I spoke with patient and she wants records to be sent to Dr. Clarice Pole office. I informed her that we need signed authorization before we can release her records. She stated she will try to come by this morning to sign release form.

## 2017-01-30 ENCOUNTER — Telehealth (INDEPENDENT_AMBULATORY_CARE_PROVIDER_SITE_OTHER): Payer: Self-pay | Admitting: Physical Medicine and Rehabilitation

## 2017-01-30 NOTE — Telephone Encounter (Signed)
Copy of records faxed to Dr. Clarice Pole office.  Also copy of records ready at front dest for patient to pick up.(lmam)

## 2017-03-15 ENCOUNTER — Other Ambulatory Visit: Payer: Self-pay | Admitting: Neurological Surgery

## 2017-03-15 ENCOUNTER — Other Ambulatory Visit (HOSPITAL_COMMUNITY): Payer: Self-pay | Admitting: Neurological Surgery

## 2017-03-15 DIAGNOSIS — M4316 Spondylolisthesis, lumbar region: Secondary | ICD-10-CM

## 2017-03-28 ENCOUNTER — Ambulatory Visit (HOSPITAL_COMMUNITY)
Admission: RE | Admit: 2017-03-28 | Discharge: 2017-03-28 | Disposition: A | Discharge status: Home / Self Care | Payer: BLUE CROSS/BLUE SHIELD | Source: Ambulatory Visit | Attending: Neurological Surgery | Admitting: Neurological Surgery

## 2017-03-28 DIAGNOSIS — M4316 Spondylolisthesis, lumbar region: Secondary | ICD-10-CM

## 2017-03-28 DIAGNOSIS — Z981 Arthrodesis status: Secondary | ICD-10-CM | POA: Insufficient documentation

## 2017-03-28 DIAGNOSIS — M48061 Spinal stenosis, lumbar region without neurogenic claudication: Secondary | ICD-10-CM | POA: Diagnosis not present

## 2017-03-28 MED ORDER — IOPAMIDOL (ISOVUE-M 200) INJECTION 41%
INTRAMUSCULAR | Status: AC
  Administered 2017-03-28: 12 mL via INTRATHECAL
  Filled 2017-03-28: qty 10

## 2017-03-28 MED ORDER — OXYCODONE-ACETAMINOPHEN 5-325 MG PO TABS
ORAL_TABLET | ORAL | Status: AC
  Filled 2017-03-28: qty 2

## 2017-03-28 MED ORDER — DIAZEPAM 5 MG PO TABS
ORAL_TABLET | ORAL | Status: AC
  Administered 2017-03-28: 10 mg via ORAL
  Filled 2017-03-28: qty 2

## 2017-03-28 MED ORDER — DIAZEPAM 5 MG PO TABS
10.0000 mg | ORAL_TABLET | Freq: Once | ORAL | Status: AC
  Administered 2017-03-28: 10 mg via ORAL
  Filled 2017-03-28: qty 2

## 2017-03-28 MED ORDER — ONDANSETRON HCL 4 MG/2ML IJ SOLN: 4.0000 mg | Freq: Four times a day (QID) | INTRAMUSCULAR | Status: DC | PRN

## 2017-03-28 MED ORDER — IOPAMIDOL (ISOVUE-M 200) INJECTION 41%
20.0000 mL | Freq: Once | INTRAMUSCULAR | Status: AC
  Administered 2017-03-28: 12 mL via INTRATHECAL

## 2017-03-28 MED ORDER — LIDOCAINE HCL (PF) 1 % IJ SOLN
INTRAMUSCULAR | Status: AC
  Administered 2017-03-28: 2 mL via INTRADERMAL
  Filled 2017-03-28: qty 5

## 2017-03-28 MED ORDER — DEXAMETHASONE 4 MG PO TABS
4.0000 mg | ORAL_TABLET | Freq: Once | ORAL | Status: DC
  Filled 2017-03-28: qty 1

## 2017-03-28 MED ORDER — OXYCODONE-ACETAMINOPHEN 5-325 MG PO TABS: 1.0000 | ORAL_TABLET | ORAL | Status: DC | PRN

## 2017-03-28 MED ORDER — LIDOCAINE HCL (PF) 1 % IJ SOLN
5.0000 mL | Freq: Once | INTRAMUSCULAR | Status: AC
Start: 2017-03-28 — End: 2017-03-28
  Administered 2017-03-28: 2 mL via INTRADERMAL

## 2017-03-28 NOTE — Procedures (Signed)
Michaela Raymond 61 year old individual who had surgery in 2010 by Dr. Earle Gell. At that time she had a decompression and fusion from L4 to the sacrum. 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 She deferred surgery at that level. She has had continued problems with back pain radiating leg pain and increasing limitation in her ability to tolerate activities because of the pain. I evaluated her a week or 2 ago in the office and noted she needed a myelogram in order to better demonstrate the path of anatomy at the adjacent level to her hardware. She is now admitted for this myelogram.  Pre op Dx: Spondylolisthesis and stenosis L3-L4 Post op Dx: Same Procedure: Lumbar myelogram Surgeon: Vineet Kinney Puncture level: L2-3 Fluid color: Clear, colorless Injection: Isovue 200, 12 mL Findings: Complete myelographic block at L3-L4. Moderate stenosis at L2-L3. Fractured screws at S1. Further evaluation with CT scanning.

## 2017-03-28 NOTE — Discharge Instructions (Signed)
Myelogram, Care After °Refer to this sheet in the next few weeks. These instructions provide you with information about caring for yourself after your procedure. Your health care provider may also give you more specific instructions. Your treatment has been planned according to current medical practices, but problems sometimes occur. Call your health care provider if you have any problems or questions after your procedure. °What can I expect after the procedure? °After the procedure, it is common to have: °· Soreness at your injection site. °· A mild headache. ° °Follow these instructions at home: °· Drink enough fluid to keep your urine clear or pale yellow. This will help flush out the dye (contrast material) from your spine. °· Rest as told by your health care provider. Lie flat with your head slightly raised (elevated) to reduce the risk of headache. °· Do not bend, lift, or do any strenuous activity for 24-48 hours or as told by your health care provider. °· Take over-the-counter and prescription medicines only as told by your health care provider. °· Take care of and remove your bandage (dressing) as told by your health care provider. °· Bathe or shower as told by your health care provider. °Contact a health care provider if: °· You have a fever. °· You have a headache that lasts longer than 24 hours. °· You feel nauseous or vomit. °· You have a stiff neck or numbness in your legs. °· You are unable to urinate or have a bowel movement. °· You develop a rash, itching, or sneezing. °Get help right away if: °· You have new symptoms or your symptoms get worse. °· You have a seizure. °· You have trouble breathing. °This information is not intended to replace advice given to you by your health care provider. Make sure you discuss any questions you have with your health care provider. °Document Released: 07/01/2015 Document Revised: 11/10/2015 Document Reviewed: 03/17/2015 °Elsevier Interactive Patient Education ©  2018 [employer]vier Inc. ° °

## 2017-04-04 ENCOUNTER — Other Ambulatory Visit: Payer: Self-pay | Admitting: Neurological Surgery

## 2017-05-28 NOTE — Pre-Procedure Instructions (Signed)
Michaela Raymond  05/28/2017      [place] [address] [place] [address] [place] [address] Phone: [phone] Fax: [phone]    Your procedure is scheduled on Dec 18  Report to [place] at 530 A.M.  Call this number if you have problems the morning of surgery:  [phone]   Remember:  Do not eat food or drink liquids after midnight.  Take these medicines the morning of surgery with A SIP OF WATER   Stop taking aspirin, BC's, Goody's, Herbal medications, Fish Oil, Ibuprofen, Advil, Motrin, Aleve, Vitamins   Do not wear jewelry, make-up or nail polish.  Do not wear lotions, powders, or perfumes, or deodorant.  Do not shave 48 hours prior to surgery.  Men may shave face and neck.  Do not bring valuables to the hospital.  [facility] is not responsible for any belongings or valuables.  Contacts, dentures or bridgework may not be worn into surgery.  Leave your suitcase in the car.  After surgery it may be brought to your room.  For patients admitted to the hospital, discharge time will be determined by your treatment team.  Patients discharged the day of surgery will not be allowed to drive home.   Special instructions:  [place] - Preparing for Surgery  Before surgery, you can play an important role.  Because skin is not sterile, your skin needs to be as free of germs as possible.  You can reduce the number of germs on you skin by washing with CHG (chlorahexidine gluconate) soap before surgery.  CHG is an antiseptic cleaner which kills germs and bonds with the skin to continue killing germs even after washing.  Please DO NOT use if you have an allergy to CHG or antibacterial soaps.  If your skin becomes reddened/irritated stop using the CHG and inform your nurse when you arrive at Short Stay.  Do not shave (including legs and underarms) for at least 48 hours prior to the first CHG shower.   You may shave your face.  Please follow these instructions carefully:   1.  Shower with CHG Soap the night before surgery and the                                morning of Surgery.  2.  If you choose to wash your hair, wash your hair first as usual with your  normal shampoo.  3.  After you shampoo, rinse your hair and body thoroughly to remove the                      Shampoo.  4.  Use CHG as you would any other liquid soap.  You can apply chg directly  to the skin and wash gently with scrungie or a clean washcloth.  5.  Apply the CHG Soap to your body ONLY FROM THE NECK DOWN.  Do not use on open wounds or open sores.  Avoid contact with your eyes,  ears, mouth and genitals (private parts).  Wash genitals (private parts)  with your normal soap.  6.  Wash thoroughly, paying special attention to the area where your surgery  will be performed.  7.  Thoroughly rinse your body with warm water from the neck down.  8.  DO NOT shower/wash with your normal soap after using  and rinsing off  the CHG Soap.  9.  Pat yourself dry with a clean towel.            10.  Wear clean pajamas.            11.  Place clean sheets on your bed the night of your first shower and do not  sleep with pets.  Day of Surgery  Do not apply any lotions/deoderants the morning of surgery.  Please wear clean clothes to the hospital/surgery center.     Please read over the following fact sheets that you were given. Pain Booklet, Coughing and Deep Breathing, MRSA Information and Surgical Site Infection Prevention

## 2017-05-29 ENCOUNTER — Other Ambulatory Visit: Payer: Self-pay

## 2017-05-29 ENCOUNTER — Encounter (HOSPITAL_COMMUNITY): Payer: Self-pay

## 2017-05-29 ENCOUNTER — Encounter (HOSPITAL_COMMUNITY)
Admission: RE | Admit: 2017-05-29 | Discharge: 2017-05-29 | Disposition: A | Discharge status: Home / Self Care | Payer: BLUE CROSS/BLUE SHIELD | Source: Ambulatory Visit | Attending: Neurological Surgery | Admitting: Neurological Surgery

## 2017-05-29 DIAGNOSIS — Z01818 Encounter for other preprocedural examination: Secondary | ICD-10-CM | POA: Diagnosis present

## 2017-05-29 DIAGNOSIS — M4316 Spondylolisthesis, lumbar region: Secondary | ICD-10-CM | POA: Diagnosis not present

## 2017-05-29 HISTORY — DX: Other complications of anesthesia, initial encounter: T88.59XA

## 2017-05-29 HISTORY — DX: Adverse effect of unspecified anesthetic, initial encounter: T41.45XA

## 2017-05-29 HISTORY — DX: Dyspnea, unspecified: R06.00

## 2017-05-29 HISTORY — DX: Anemia, unspecified: D64.9

## 2017-05-29 LAB — CBC
HEMATOCRIT: 34.6 % — AB (ref 36.0–46.0)
HEMOGLOBIN: 10.6 g/dL — AB (ref 12.0–15.0)
MCH: 24.5 pg — AB (ref 26.0–34.0)
MCHC: 30.6 g/dL (ref 30.0–36.0)
MCV: 79.9 fL (ref 78.0–100.0)
Platelets: 369 10*3/uL (ref 150–400)
RBC: 4.33 MIL/uL (ref 3.87–5.11)
RDW: 16.9 % — AB (ref 11.5–15.5)
WBC: 8 10*3/uL (ref 4.0–10.5)

## 2017-05-29 LAB — TYPE AND SCREEN
ABO/RH(D): O NEG
ANTIBODY SCREEN: NEGATIVE

## 2017-05-29 LAB — BASIC METABOLIC PANEL
Anion gap: 8 (ref 5–15)
BUN: 13 mg/dL (ref 6–20)
CALCIUM: 9 mg/dL (ref 8.9–10.3)
CHLORIDE: 105 mmol/L (ref 101–111)
CO2: 26 mmol/L (ref 22–32)
CREATININE: 0.87 mg/dL (ref 0.44–1.00)
GFR calc Af Amer: >60 mL/min (ref >60)
GFR calc non Af Amer: >60 mL/min (ref >60)
GLUCOSE: 95 mg/dL (ref 65–99)
Potassium: 4.7 mmol/L (ref 3.5–5.1)
Sodium: 139 mmol/L (ref 135–145)

## 2017-05-29 LAB — ABO/RH: ABO/RH(D): O NEG

## 2017-05-29 LAB — SURGICAL PCR SCREEN
MRSA, PCR: NEGATIVE
STAPHYLOCOCCUS AUREUS: NEGATIVE

## 2017-05-29 NOTE — Pre-Procedure Instructions (Signed)
Michaela Raymond  05/29/2017      [place] [address], [place] [address] [place] [address] Phone: [phone] Fax: [phone]    Your procedure is scheduled on Dec 18  Report to [facility] at 530 A.M.  Call this number if you have problems the morning of surgery:  [phone]   Remember:  Do not eat food or drink liquids after midnight.  Take these medicines the morning of surgery with A SIP OF WATER : tylenol if needed, cyclobenzaprine (flexeril), flonase, hydrocodone if needed, pantoprazole (protonix), alosetron (lotronex)  Stop taking aspirin, BC's, Goody's, Herbal medications, Fish Oil, Ibuprofen, Advil, Motrin, Aleve, Vitamins   Do not wear jewelry, make-up or nail polish.  Do not wear lotions, powders, or perfumes, or deodorant.  Do not shave 48 hours prior to surgery.  Men may shave face and neck.  Do not bring valuables to the hospital.  [facility] is not responsible for any belongings or valuables.  Contacts, dentures or bridgework may not be worn into surgery.  Leave your suitcase in the car.  After surgery it may be brought to your room.  For patients admitted to the hospital, discharge time will be determined by your treatment team.  Patients discharged the day of surgery will not be allowed to drive home.   Special instructions:  [place] - Preparing for Surgery  Before surgery, you can play an important role.  Because skin is not sterile, your skin needs to be as free of germs as possible.  You can reduce the number of germs on you skin by washing with CHG (chlorahexidine gluconate) soap before surgery.  CHG is an antiseptic cleaner which kills germs and bonds with the skin to continue killing germs even after washing.  Please DO NOT use if you have an allergy to CHG or antibacterial soaps.  If your skin becomes reddened/irritated stop using the CHG and inform your nurse when  you arrive at Short Stay.  Do not shave (including legs and underarms) for at least 48 hours prior to the first CHG shower.  You may shave your face.  Please follow these instructions carefully:   1.  Shower with CHG Soap the night before surgery and the                                morning of Surgery.  2.  If you choose to wash your hair, wash your hair first as usual with your  normal shampoo.  3.  After you shampoo, rinse your hair and body thoroughly to remove the                      Shampoo.  4.  Use CHG as you would any other liquid soap.  You can apply chg directly  to the skin and wash gently with scrungie or a clean washcloth.  5.  Apply the CHG Soap to your body ONLY FROM THE NECK DOWN.  Do not use on open wounds or open sores.  Avoid contact with your eyes,  ears, mouth and genitals (private parts).  Wash genitals (private parts)  with your normal soap.  6.  Wash thoroughly, paying special attention to the area where your surgery  will be performed.  7.  Thoroughly rinse your body with warm water from the neck  down.  8.  DO NOT shower/wash with your normal soap after using and rinsing off  the CHG Soap.  9.  Pat yourself dry with a clean towel.            10.  Wear clean pajamas.            11.  Place clean sheets on your bed the night of your first shower and do not  sleep with pets.  Day of Surgery  Do not apply any lotions/deoderants the morning of surgery.  Please wear clean clothes to the hospital/surgery center.     Please read over the following fact sheets that you were given. Pain Booklet, Coughing and Deep Breathing, MRSA Information and Surgical Site Infection Prevention

## 2017-05-29 NOTE — Progress Notes (Signed)
QDI:YMEBRAX gibson, NP  @ [address]  Cardiologist: Dr. Ellyn Hack --pt. Reports he has released her 2 yrs. Ago.  Sleep study 13 yrs. ago

## 2017-05-30 NOTE — Progress Notes (Signed)
Anesthesia Chart Review: Patient is a 61 year old female scheduled for L2-3, L3-4 PLIF with removal of old hardware on 06/04/17 by Dr. Kristeen Miss.  History includes smoking, chest pain (with normal coronaries) '11, murmur ("1/6 SEM at the RUSB" per 09/23/16 note by cardiologist Dr. 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 She denied history of COPD.  For anesthesia history, she reported apnea during colonoscopy and had to be converted to general anesthesia.  - PCP is Nena Polio, NP at [place] ([facility] - Cardiologist is Dr. Glenetta Hew, last visit 09/23/16. He recommended PRN cardiology follow-up.    Meds include Lotronex, Xanax, Climara Pro, Flexeril, Flonase, Neurontin, Norco, Xyzal, levothyroxine, Lidoderm, losartan, Singulair, Protonix, Carafate PRN, amoxicillin (before dental procedures).  BP (!) 144/64   Pulse 69   Temp 36.6 C (Oral)   Resp 20   Ht 5' 3.5" (1.613 m)   Wt 156 lb 3 oz (70.8 kg)   SpO2 98%   BMI 27.23 kg/m   EKG 09/21/16: NSR, incomplete right BBB.  Cardiac cath 05/03/10: Impression: 1. No angiographic significant coronary artery disease in the left coronary system. Left main bifurcates into a large LAD and left dominant circumflex artery. There is no angiographic significant lesion in the LAD and circumflex systems. RCA is a very small non-dominant artery with large conus branch that is almost with the same ostium. There is no angiographic RCA disease. 2. Normal left ventricular function. EF 60-65%. 3. Most likely nonanginal pain.   Echo 05/03/10 (scanned under Notes tab): Study  conclusions: -Left ventricle: The cavity size was normal. Wall thickness was at the upper limits of normal. Systolic function was normal. The estimated ejection fraction was in the range of 55-65%. Features are consistent with a pseudo-normal left ventricular filling pattern, with concomitant abnormal relaxation and increased filling pressure (grade 2 diastolic dysfunction). Doppler parameters are consistent with both elevated ventricular end-diastolic filling pressure and elevated left atrial filling pressure. -Aortic valve: Mild regurgitation. - Left atrium: The atrium was mildly dilated. -Ventricular septum: The outflow septum had a sigmoid appearance. -Mitral valve: Calcified annulus. Trivial regurgitation. -Pulmonary arteries: Systolic pressure could not be accurately estimated.  Preoperative labs noted. BMET WNL. H/H 10.6/34.5 (prevously H/H 11.5/36.2 on 12/10/16 in [place]), PLT 369. T&S done.   If no acute changes then I would anticipate that she can proceed as planned.  George Hugh [facility] Short Stay Center/Anesthesiology Phone [phone] 05/30/2017 3:12 PM

## 2017-06-03 NOTE — Anesthesia Preprocedure Evaluation (Addendum)
Anesthesia Evaluation  Patient identified by MRN, date of birth, ID band Patient awake    Reviewed: Allergy & Precautions, NPO status , Patient's Chart, lab work & pertinent test results  History of Anesthesia Complications Negative for: history of anesthetic complications  Airway Mallampati: III  TM Distance: >3 FB Neck ROM: Full    Dental  (+) Dental Advisory Given   Pulmonary sleep apnea and Continuous Positive Airway Pressure Ventilation , COPD,  COPD inhaler, Current Smoker,    breath sounds clear to auscultation       Cardiovascular hypertension, Pt. on medications (-) angina Rhythm:Regular Rate:Normal  '11 cath: EF 60-65%, normal coronaries '11 ECHO: Normal LVF, EF 55-60%, valves OK   Neuro/Psych Chronic back pain: narcotics    GI/Hepatic Neg liver ROS, GERD  Medicated and Controlled,S/p gastric bypass   Endo/Other  Hypothyroidism   Renal/GU negative Renal ROS     Musculoskeletal  (+) Arthritis , Osteoarthritis,  Fibromyalgia -, narcotic dependent  Abdominal   Peds  Hematology Hb 10.6   Anesthesia Other Findings   Reproductive/Obstetrics                            Anesthesia Physical Anesthesia Plan  ASA: III  Anesthesia Plan: General   Post-op Pain Management:    Induction: Intravenous  PONV Risk Score and Plan: 3 and Ondansetron, Dexamethasone and Scopolamine patch - Pre-op  Airway Management Planned: Oral ETT and Video Laryngoscope Planned  Additional Equipment:   Intra-op Plan:   Post-operative Plan: Extubation in OR  Informed Consent: I have reviewed the patients History and Physical, chart, labs and discussed the procedure including the risks, benefits and alternatives for the proposed anesthesia with the patient or authorized representative who has indicated his/her understanding and acceptance.   Dental advisory given  Plan Discussed with: CRNA and  Surgeon  Anesthesia Plan Comments: (Plan routine monitors, GETA)        Anesthesia Quick Evaluation

## 2017-06-04 ENCOUNTER — Inpatient Hospital Stay (HOSPITAL_COMMUNITY): Payer: BLUE CROSS/BLUE SHIELD | Admitting: Anesthesiology

## 2017-06-04 ENCOUNTER — Inpatient Hospital Stay (HOSPITAL_COMMUNITY): Payer: BLUE CROSS/BLUE SHIELD

## 2017-06-04 ENCOUNTER — Inpatient Hospital Stay (HOSPITAL_COMMUNITY): Payer: BLUE CROSS/BLUE SHIELD | Admitting: Vascular Surgery

## 2017-06-04 ENCOUNTER — Encounter (HOSPITAL_COMMUNITY)
Admission: RE | Disposition: A | Discharge status: Home / Self Care | Payer: Self-pay | Source: Ambulatory Visit | Attending: Neurological Surgery

## 2017-06-04 ENCOUNTER — Encounter (HOSPITAL_COMMUNITY): Payer: Self-pay | Admitting: *Deleted

## 2017-06-04 ENCOUNTER — Inpatient Hospital Stay (HOSPITAL_COMMUNITY)
Admission: RE | Admit: 2017-06-04 | Discharge: 2017-06-06 | DRG: 454 | Disposition: A | Discharge status: Home / Self Care | Payer: BLUE CROSS/BLUE SHIELD | Source: Ambulatory Visit | Attending: Neurological Surgery | Admitting: Neurological Surgery

## 2017-06-04 DIAGNOSIS — Z96652 Presence of left artificial knee joint: Secondary | ICD-10-CM | POA: Diagnosis present

## 2017-06-04 DIAGNOSIS — I10 Essential (primary) hypertension: Secondary | ICD-10-CM | POA: Diagnosis present

## 2017-06-04 DIAGNOSIS — G4730 Sleep apnea, unspecified: Secondary | ICD-10-CM | POA: Diagnosis present

## 2017-06-04 DIAGNOSIS — F17200 Nicotine dependence, unspecified, uncomplicated: Secondary | ICD-10-CM | POA: Diagnosis present

## 2017-06-04 DIAGNOSIS — Z9884 Bariatric surgery status: Secondary | ICD-10-CM | POA: Diagnosis not present

## 2017-06-04 DIAGNOSIS — M48062 Spinal stenosis, lumbar region with neurogenic claudication: Principal | ICD-10-CM | POA: Diagnosis present

## 2017-06-04 DIAGNOSIS — J449 Chronic obstructive pulmonary disease, unspecified: Secondary | ICD-10-CM | POA: Diagnosis present

## 2017-06-04 DIAGNOSIS — Z981 Arthrodesis status: Secondary | ICD-10-CM | POA: Diagnosis not present

## 2017-06-04 DIAGNOSIS — D62 Acute posthemorrhagic anemia: Secondary | ICD-10-CM | POA: Diagnosis not present

## 2017-06-04 DIAGNOSIS — E039 Hypothyroidism, unspecified: Secondary | ICD-10-CM | POA: Diagnosis present

## 2017-06-04 DIAGNOSIS — M5136 Other intervertebral disc degeneration, lumbar region: Secondary | ICD-10-CM

## 2017-06-04 DIAGNOSIS — M4316 Spondylolisthesis, lumbar region: Secondary | ICD-10-CM | POA: Diagnosis present

## 2017-06-04 DIAGNOSIS — M5416 Radiculopathy, lumbar region: Secondary | ICD-10-CM | POA: Diagnosis present

## 2017-06-04 DIAGNOSIS — Z419 Encounter for procedure for purposes other than remedying health state, unspecified: Secondary | ICD-10-CM

## 2017-06-04 SURGERY — POSTERIOR LUMBAR FUSION 2 WITH HARDWARE REMOVAL
Anesthesia: General | Site: Back

## 2017-06-04 MED ORDER — MIDAZOLAM HCL 2 MG/2ML IJ SOLN
INTRAMUSCULAR | Status: AC
  Filled 2017-06-04: qty 2

## 2017-06-04 MED ORDER — CHLORHEXIDINE GLUCONATE CLOTH 2 % EX PADS: 6.0000 | MEDICATED_PAD | Freq: Once | CUTANEOUS | Status: DC

## 2017-06-04 MED ORDER — MIDAZOLAM HCL 2 MG/2ML IJ SOLN: 0.5000 mg | Freq: Once | INTRAMUSCULAR | Status: DC | PRN

## 2017-06-04 MED ORDER — LACTATED RINGERS IV SOLN
INTRAVENOUS | Status: DC | PRN
  Administered 2017-06-04 (×3): via INTRAVENOUS

## 2017-06-04 MED ORDER — BISACODYL 10 MG RE SUPP: 10.0000 mg | Freq: Every day | RECTAL | Status: DC | PRN

## 2017-06-04 MED ORDER — MONTELUKAST SODIUM 10 MG PO TABS
10.0000 mg | ORAL_TABLET | Freq: Every day | ORAL | Status: DC
  Administered 2017-06-04 – 2017-06-05 (×2): 10 mg via ORAL
  Filled 2017-06-04 (×2): qty 1

## 2017-06-04 MED ORDER — ONDANSETRON HCL 4 MG/2ML IJ SOLN
INTRAMUSCULAR | Status: AC
  Filled 2017-06-04: qty 2

## 2017-06-04 MED ORDER — FLUTICASONE PROPIONATE 50 MCG/ACT NA SUSP
2.0000 | Freq: Every day | NASAL | Status: DC
  Administered 2017-06-04 – 2017-06-05 (×2): 2 via NASAL
  Filled 2017-06-04: qty 16

## 2017-06-04 MED ORDER — CEFAZOLIN SODIUM 1 G IJ SOLR
INTRAMUSCULAR | Status: AC
  Filled 2017-06-04: qty 20

## 2017-06-04 MED ORDER — LIDOCAINE 2% (20 MG/ML) 5 ML SYRINGE
INTRAMUSCULAR | Status: DC | PRN
  Administered 2017-06-04: 20 mg via INTRAVENOUS

## 2017-06-04 MED ORDER — EPHEDRINE 5 MG/ML INJ
INTRAVENOUS | Status: AC
  Filled 2017-06-04: qty 10

## 2017-06-04 MED ORDER — BUPIVACAINE HCL (PF) 0.5 % IJ SOLN
INTRAMUSCULAR | Status: AC
  Filled 2017-06-04: qty 30

## 2017-06-04 MED ORDER — ROCURONIUM BROMIDE 10 MG/ML (PF) SYRINGE
PREFILLED_SYRINGE | INTRAVENOUS | Status: DC | PRN
  Administered 2017-06-04 (×4): 10 mg via INTRAVENOUS
  Administered 2017-06-04: 50 mg via INTRAVENOUS
  Administered 2017-06-04: 10 mg via INTRAVENOUS

## 2017-06-04 MED ORDER — ROCURONIUM BROMIDE 10 MG/ML (PF) SYRINGE
PREFILLED_SYRINGE | INTRAVENOUS | Status: AC
  Filled 2017-06-04: qty 5

## 2017-06-04 MED ORDER — BUPIVACAINE HCL (PF) 0.5 % IJ SOLN
INTRAMUSCULAR | Status: DC | PRN
  Administered 2017-06-04: 5 mL
  Administered 2017-06-04: 20 mL

## 2017-06-04 MED ORDER — LACTATED RINGERS IV SOLN: INTRAVENOUS | Status: DC

## 2017-06-04 MED ORDER — ALPRAZOLAM 0.25 MG PO TABS
0.2500 mg | ORAL_TABLET | Freq: Every day | ORAL | Status: DC
  Administered 2017-06-04 – 2017-06-05 (×2): 0.25 mg via ORAL
  Filled 2017-06-04 (×2): qty 1

## 2017-06-04 MED ORDER — DEXAMETHASONE SODIUM PHOSPHATE 10 MG/ML IJ SOLN
INTRAMUSCULAR | Status: DC | PRN
  Administered 2017-06-04: 10 mg via INTRAVENOUS

## 2017-06-04 MED ORDER — CEFAZOLIN SODIUM-DEXTROSE 2-4 GM/100ML-% IV SOLN
2.0000 g | INTRAVENOUS | Status: AC
  Administered 2017-06-04 (×2): 2 g via INTRAVENOUS

## 2017-06-04 MED ORDER — HYDROCODONE-ACETAMINOPHEN 5-325 MG PO TABS
2.0000 | ORAL_TABLET | ORAL | Status: DC | PRN
  Administered 2017-06-04 – 2017-06-05 (×6): 2 via ORAL
  Filled 2017-06-04 (×6): qty 2

## 2017-06-04 MED ORDER — PROPOFOL 10 MG/ML IV BOLUS
INTRAVENOUS | Status: DC | PRN
  Administered 2017-06-04: 120 mg via INTRAVENOUS

## 2017-06-04 MED ORDER — SUGAMMADEX SODIUM 200 MG/2ML IV SOLN
INTRAVENOUS | Status: AC
  Filled 2017-06-04: qty 2

## 2017-06-04 MED ORDER — ARTIFICIAL TEARS OPHTHALMIC OINT
TOPICAL_OINTMENT | OPHTHALMIC | Status: AC
  Filled 2017-06-04: qty 3.5

## 2017-06-04 MED ORDER — LORATADINE 10 MG PO TABS
10.0000 mg | ORAL_TABLET | Freq: Every day | ORAL | Status: DC
  Administered 2017-06-04 – 2017-06-05 (×2): 10 mg via ORAL
  Filled 2017-06-04 (×2): qty 1

## 2017-06-04 MED ORDER — THROMBIN (RECOMBINANT) 5000 UNITS EX SOLR
OROMUCOSAL | Status: DC | PRN
  Administered 2017-06-04 (×3): 5 mL via TOPICAL

## 2017-06-04 MED ORDER — ALUM & MAG HYDROXIDE-SIMETH 200-200-20 MG/5ML PO SUSP: 30.0000 mL | Freq: Four times a day (QID) | ORAL | Status: DC | PRN

## 2017-06-04 MED ORDER — LEVOCETIRIZINE DIHYDROCHLORIDE 5 MG PO TABS: 5.0000 mg | ORAL_TABLET | Freq: Every day | ORAL | Status: DC

## 2017-06-04 MED ORDER — THROMBIN (RECOMBINANT) 5000 UNITS EX SOLR
CUTANEOUS | Status: AC
  Filled 2017-06-04: qty 5000

## 2017-06-04 MED ORDER — SUGAMMADEX SODIUM 200 MG/2ML IV SOLN
INTRAVENOUS | Status: DC | PRN
  Administered 2017-06-04: 150 mg via INTRAVENOUS

## 2017-06-04 MED ORDER — HYDROMORPHONE HCL 1 MG/ML IJ SOLN
0.2500 mg | INTRAMUSCULAR | Status: DC | PRN
  Administered 2017-06-04 (×2): 0.5 mg via INTRAVENOUS

## 2017-06-04 MED ORDER — PROMETHAZINE HCL 25 MG/ML IJ SOLN: 6.2500 mg | INTRAMUSCULAR | Status: DC | PRN

## 2017-06-04 MED ORDER — FENTANYL CITRATE (PF) 250 MCG/5ML IJ SOLN
INTRAMUSCULAR | Status: AC
  Filled 2017-06-04: qty 5

## 2017-06-04 MED ORDER — MIDAZOLAM HCL 5 MG/5ML IJ SOLN
INTRAMUSCULAR | Status: DC | PRN
  Administered 2017-06-04: 2 mg via INTRAVENOUS

## 2017-06-04 MED ORDER — SENNA 8.6 MG PO TABS
1.0000 | ORAL_TABLET | Freq: Two times a day (BID) | ORAL | Status: DC
  Administered 2017-06-04 – 2017-06-06 (×4): 8.6 mg via ORAL
  Filled 2017-06-04 (×4): qty 1

## 2017-06-04 MED ORDER — FLEET ENEMA 7-19 GM/118ML RE ENEM: 1.0000 | ENEMA | Freq: Once | RECTAL | Status: DC | PRN

## 2017-06-04 MED ORDER — ALOSETRON HCL 0.5 MG PO TABS
0.5000 mg | ORAL_TABLET | Freq: Two times a day (BID) | ORAL | Status: DC
  Filled 2017-06-04 (×4): qty 1

## 2017-06-04 MED ORDER — MORPHINE SULFATE (PF) 4 MG/ML IV SOLN
2.0000 mg | INTRAVENOUS | Status: DC | PRN
  Administered 2017-06-04: 2 mg via INTRAVENOUS
  Filled 2017-06-04: qty 1

## 2017-06-04 MED ORDER — GABAPENTIN 100 MG PO CAPS
100.0000 mg | ORAL_CAPSULE | Freq: Every day | ORAL | Status: DC
Start: 2017-06-05 — End: 2017-06-06
  Administered 2017-06-05 – 2017-06-06 (×2): 100 mg via ORAL
  Filled 2017-06-04 (×3): qty 1

## 2017-06-04 MED ORDER — LIDOCAINE 2% (20 MG/ML) 5 ML SYRINGE
INTRAMUSCULAR | Status: AC
  Filled 2017-06-04: qty 5

## 2017-06-04 MED ORDER — SODIUM CHLORIDE 0.9% FLUSH: 3.0000 mL | INTRAVENOUS | Status: DC | PRN

## 2017-06-04 MED ORDER — ACETAMINOPHEN 325 MG PO TABS: 650.0000 mg | ORAL_TABLET | ORAL | Status: DC | PRN

## 2017-06-04 MED ORDER — LIDOCAINE-EPINEPHRINE 1 %-1:100000 IJ SOLN
INTRAMUSCULAR | Status: DC | PRN
  Administered 2017-06-04: 5 mL

## 2017-06-04 MED ORDER — DOCUSATE SODIUM 100 MG PO CAPS
100.0000 mg | ORAL_CAPSULE | Freq: Two times a day (BID) | ORAL | Status: DC
  Administered 2017-06-04 – 2017-06-06 (×4): 100 mg via ORAL
  Filled 2017-06-04 (×4): qty 1

## 2017-06-04 MED ORDER — ONDANSETRON HCL 4 MG/2ML IJ SOLN: 4.0000 mg | Freq: Four times a day (QID) | INTRAMUSCULAR | Status: DC | PRN

## 2017-06-04 MED ORDER — LEVOTHYROXINE SODIUM 137 MCG PO TABS
137.0000 ug | ORAL_TABLET | Freq: Every day | ORAL | Status: DC
  Administered 2017-06-05 – 2017-06-06 (×2): 137 ug via ORAL
  Filled 2017-06-04 (×2): qty 1

## 2017-06-04 MED ORDER — HYDROCODONE-ACETAMINOPHEN 5-325 MG PO TABS: 1.0000 | ORAL_TABLET | Freq: Four times a day (QID) | ORAL | Status: DC | PRN

## 2017-06-04 MED ORDER — SODIUM CHLORIDE 0.9 % IV SOLN: 250.0000 mL | INTRAVENOUS | Status: DC

## 2017-06-04 MED ORDER — SUCRALFATE 1 GM/10ML PO SUSP
1.0000 g | Freq: Three times a day (TID) | ORAL | Status: DC | PRN
  Filled 2017-06-04: qty 10

## 2017-06-04 MED ORDER — HYDROMORPHONE HCL 1 MG/ML IJ SOLN
INTRAMUSCULAR | Status: AC
  Administered 2017-06-04: 0.5 mg via INTRAVENOUS
  Filled 2017-06-04: qty 1

## 2017-06-04 MED ORDER — FENTANYL CITRATE (PF) 100 MCG/2ML IJ SOLN
INTRAMUSCULAR | Status: DC | PRN
  Administered 2017-06-04 (×2): 50 ug via INTRAVENOUS
  Administered 2017-06-04: 200 ug via INTRAVENOUS
  Administered 2017-06-04: 50 ug via INTRAVENOUS

## 2017-06-04 MED ORDER — CEFAZOLIN SODIUM-DEXTROSE 2-4 GM/100ML-% IV SOLN
2.0000 g | Freq: Three times a day (TID) | INTRAVENOUS | Status: AC
  Administered 2017-06-04 – 2017-06-05 (×2): 2 g via INTRAVENOUS
  Filled 2017-06-04 (×2): qty 100

## 2017-06-04 MED ORDER — MEPERIDINE HCL 25 MG/ML IJ SOLN: 6.2500 mg | INTRAMUSCULAR | Status: DC | PRN

## 2017-06-04 MED ORDER — ONDANSETRON HCL 4 MG/2ML IJ SOLN
INTRAMUSCULAR | Status: DC | PRN
  Administered 2017-06-04: 4 mg via INTRAVENOUS

## 2017-06-04 MED ORDER — ONDANSETRON HCL 4 MG PO TABS
4.0000 mg | ORAL_TABLET | Freq: Four times a day (QID) | ORAL | Status: DC | PRN
Start: 2017-06-04 — End: 2017-06-06

## 2017-06-04 MED ORDER — CEFAZOLIN SODIUM-DEXTROSE 2-4 GM/100ML-% IV SOLN
INTRAVENOUS | Status: AC
  Filled 2017-06-04: qty 100

## 2017-06-04 MED ORDER — ACETAMINOPHEN 650 MG RE SUPP: 650.0000 mg | RECTAL | Status: DC | PRN

## 2017-06-04 MED ORDER — ARTIFICIAL TEARS OPHTHALMIC OINT
TOPICAL_OINTMENT | OPHTHALMIC | Status: DC | PRN
  Administered 2017-06-04: 1 via OPHTHALMIC

## 2017-06-04 MED ORDER — SCOPOLAMINE 1 MG/3DAYS TD PT72
MEDICATED_PATCH | TRANSDERMAL | Status: AC
  Filled 2017-06-04: qty 1

## 2017-06-04 MED ORDER — SODIUM CHLORIDE 0.9% FLUSH
3.0000 mL | Freq: Two times a day (BID) | INTRAVENOUS | Status: DC
  Administered 2017-06-04: 3 mL via INTRAVENOUS

## 2017-06-04 MED ORDER — THROMBIN (RECOMBINANT) 20000 UNITS EX SOLR
CUTANEOUS | Status: AC
  Filled 2017-06-04: qty 20000

## 2017-06-04 MED ORDER — METHOCARBAMOL 500 MG PO TABS
500.0000 mg | ORAL_TABLET | Freq: Four times a day (QID) | ORAL | Status: DC | PRN
  Administered 2017-06-05 – 2017-06-06 (×4): 500 mg via ORAL
  Filled 2017-06-04 (×4): qty 1

## 2017-06-04 MED ORDER — MENTHOL 3 MG MT LOZG: 1.0000 | LOZENGE | OROMUCOSAL | Status: DC | PRN

## 2017-06-04 MED ORDER — LOSARTAN POTASSIUM 50 MG PO TABS
50.0000 mg | ORAL_TABLET | Freq: Every day | ORAL | Status: DC
  Administered 2017-06-04 – 2017-06-06 (×3): 50 mg via ORAL
  Filled 2017-06-04 (×3): qty 1

## 2017-06-04 MED ORDER — HYDROCODONE-ACETAMINOPHEN 5-325 MG PO TABS: 1.0000 | ORAL_TABLET | ORAL | Status: DC | PRN

## 2017-06-04 MED ORDER — POLYETHYLENE GLYCOL 3350 17 G PO PACK: 17.0000 g | PACK | Freq: Every day | ORAL | Status: DC | PRN

## 2017-06-04 MED ORDER — SODIUM CHLORIDE 0.9 % IR SOLN
Status: DC | PRN
  Administered 2017-06-04: 500 mL

## 2017-06-04 MED ORDER — PHENYLEPHRINE HCL 10 MG/ML IJ SOLN
INTRAMUSCULAR | Status: DC | PRN
  Administered 2017-06-04: 20 ug/min via INTRAVENOUS

## 2017-06-04 MED ORDER — DEXAMETHASONE SODIUM PHOSPHATE 10 MG/ML IJ SOLN
INTRAMUSCULAR | Status: AC
  Filled 2017-06-04: qty 1

## 2017-06-04 MED ORDER — GABAPENTIN 300 MG PO CAPS
300.0000 mg | ORAL_CAPSULE | Freq: Every day | ORAL | Status: DC
  Administered 2017-06-04 – 2017-06-05 (×2): 300 mg via ORAL
  Filled 2017-06-04 (×2): qty 1

## 2017-06-04 MED ORDER — CYCLOBENZAPRINE HCL 10 MG PO TABS
10.0000 mg | ORAL_TABLET | Freq: Three times a day (TID) | ORAL | Status: DC | PRN
  Administered 2017-06-04 – 2017-06-06 (×3): 10 mg via ORAL
  Filled 2017-06-04 (×3): qty 1

## 2017-06-04 MED ORDER — HYDROMORPHONE HCL 1 MG/ML IJ SOLN: 0.2500 mg | INTRAMUSCULAR | Status: DC | PRN

## 2017-06-04 MED ORDER — PANTOPRAZOLE SODIUM 20 MG PO TBEC
20.0000 mg | DELAYED_RELEASE_TABLET | Freq: Every day | ORAL | Status: DC
Start: 2017-06-05 — End: 2017-06-06
  Administered 2017-06-05 – 2017-06-06 (×2): 20 mg via ORAL
  Filled 2017-06-04 (×2): qty 1

## 2017-06-04 MED ORDER — EPHEDRINE SULFATE-NACL 50-0.9 MG/10ML-% IV SOSY
PREFILLED_SYRINGE | INTRAVENOUS | Status: DC | PRN
  Administered 2017-06-04: 5 mg via INTRAVENOUS
  Administered 2017-06-04 (×2): 10 mg via INTRAVENOUS

## 2017-06-04 MED ORDER — PHENOL 1.4 % MT LIQD: 1.0000 | OROMUCOSAL | Status: DC | PRN

## 2017-06-04 MED ORDER — LIDOCAINE-EPINEPHRINE 1 %-1:100000 IJ SOLN
INTRAMUSCULAR | Status: AC
  Filled 2017-06-04: qty 1

## 2017-06-04 MED ORDER — SODIUM CHLORIDE 0.9 % IV SOLN
INTRAVENOUS | Status: DC | PRN
  Administered 2017-06-04: 13:00:00 via INTRAVENOUS

## 2017-06-04 MED ORDER — SCOPOLAMINE 1 MG/3DAYS TD PT72
MEDICATED_PATCH | TRANSDERMAL | Status: DC | PRN
  Administered 2017-06-04: 1 via TRANSDERMAL

## 2017-06-04 MED ORDER — METHOCARBAMOL 1000 MG/10ML IJ SOLN
500.0000 mg | Freq: Four times a day (QID) | INTRAVENOUS | Status: DC | PRN
  Filled 2017-06-04: qty 5

## 2017-06-04 MED ORDER — 0.9 % SODIUM CHLORIDE (POUR BTL) OPTIME
TOPICAL | Status: DC | PRN
  Administered 2017-06-04: 1000 mL

## 2017-06-04 MED ORDER — PROPOFOL 10 MG/ML IV BOLUS
INTRAVENOUS | Status: AC
  Filled 2017-06-04: qty 20

## 2017-06-04 SURGICAL SUPPLY — 72 items
ADH SKN CLS APL DERMABOND .7 (GAUZE/BANDAGES/DRESSINGS) ×1
APL SRG 60D 8 XTD TIP BNDBL (TIP)
BAG DECANTER FOR FLEXI CONT (MISCELLANEOUS) ×2 IMPLANT
BASKET BONE COLLECTION (BASKET) ×2 IMPLANT
BLADE CLIPPER SURG (BLADE) IMPLANT
BUR MATCHSTICK NEURO 3.0 LAGG (BURR) ×3 IMPLANT
BURR 2MM HEAD SOFT TOUCH DRILL (BURR) ×1 IMPLANT
CAGE COROENT MP 8X9X23M-8 SPIN (Cage) ×4 IMPLANT
CANISTER SUCT 3000ML PPV (MISCELLANEOUS) ×2 IMPLANT
CARTRIDGE OIL MAESTRO DRILL (MISCELLANEOUS) ×1 IMPLANT
CONNECTOR RELINE 40-50MM 5.5 (Connector) ×1 IMPLANT
CONT SPEC 4OZ CLIKSEAL STRL BL (MISCELLANEOUS) ×2 IMPLANT
COVER BACK TABLE 24X17X13 BIG (DRAPES) IMPLANT
COVER BACK TABLE 60X90IN (DRAPES) ×2 IMPLANT
DECANTER SPIKE VIAL GLASS SM (MISCELLANEOUS) ×2 IMPLANT
DERMABOND ADVANCED (GAUZE/BANDAGES/DRESSINGS) ×1
DERMABOND ADVANCED .7 DNX12 (GAUZE/BANDAGES/DRESSINGS) ×1 IMPLANT
DEVICE DISSECT PLASMABLAD 3.0S (MISCELLANEOUS) ×1 IMPLANT
DIFFUSER DRILL AIR PNEUMATIC (MISCELLANEOUS) ×2 IMPLANT
DRAPE C-ARM 42X72 X-RAY (DRAPES) ×5 IMPLANT
DRAPE HALF SHEET 40X57 (DRAPES) IMPLANT
DRAPE LAPAROTOMY 100X72X124 (DRAPES) ×2 IMPLANT
DRAPE POUCH INSTRU U-SHP 10X18 (DRAPES) ×2 IMPLANT
DRSG OPSITE POSTOP 4X8 (GAUZE/BANDAGES/DRESSINGS) ×1 IMPLANT
DURAPREP 26ML APPLICATOR (WOUND CARE) ×2 IMPLANT
DURASEAL APPLICATOR TIP (TIP) IMPLANT
DURASEAL SPINE SEALANT 3ML (MISCELLANEOUS) IMPLANT
ELECT REM PT RETURN 9FT ADLT (ELECTROSURGICAL) ×2
ELECTRODE REM PT RTRN 9FT ADLT (ELECTROSURGICAL) ×1 IMPLANT
GAUZE SPONGE 4X4 12PLY STRL (GAUZE/BANDAGES/DRESSINGS) ×2 IMPLANT
GAUZE SPONGE 4X4 16PLY XRAY LF (GAUZE/BANDAGES/DRESSINGS) ×1 IMPLANT
GLOVE BIOGEL PI IND STRL 8.5 (GLOVE) ×2 IMPLANT
GLOVE BIOGEL PI INDICATOR 8.5 (GLOVE) ×2
GLOVE ECLIPSE 8.5 STRL (GLOVE) ×4 IMPLANT
GOWN STRL REUS W/ TWL LRG LVL3 (GOWN DISPOSABLE) IMPLANT
GOWN STRL REUS W/ TWL XL LVL3 (GOWN DISPOSABLE) IMPLANT
GOWN STRL REUS W/TWL 2XL LVL3 (GOWN DISPOSABLE) ×4 IMPLANT
GOWN STRL REUS W/TWL LRG LVL3 (GOWN DISPOSABLE)
GOWN STRL REUS W/TWL XL LVL3 (GOWN DISPOSABLE) ×4
HEMOSTAT POWDER KIT SURGIFOAM (HEMOSTASIS) ×3 IMPLANT
KIT BASIN OR (CUSTOM PROCEDURE TRAY) ×2 IMPLANT
KIT INFUSE MEDIUM (Orthopedic Implant) ×1 IMPLANT
KIT ROOM TURNOVER OR (KITS) ×2 IMPLANT
MILL MEDIUM DISP (BLADE) ×2 IMPLANT
MODULE POWER NUVASIVE (MISCELLANEOUS) IMPLANT
NDL SPNL 18GX3.5 QUINCKE PK (NEEDLE) IMPLANT
NEEDLE HYPO 22GX1.5 SAFETY (NEEDLE) ×2 IMPLANT
NEEDLE SPNL 18GX3.5 QUINCKE PK (NEEDLE) IMPLANT
NS IRRIG 1000ML POUR BTL (IV SOLUTION) ×2 IMPLANT
OIL CARTRIDGE MAESTRO DRILL (MISCELLANEOUS) ×2
PACK LAMINECTOMY NEURO (CUSTOM PROCEDURE TRAY) ×2 IMPLANT
PAD ARMBOARD 7.5X6 YLW CONV (MISCELLANEOUS) ×6 IMPLANT
PATTIES SURGICAL .5 X1 (DISPOSABLE) ×2 IMPLANT
PLASMABLADE 3.0S (MISCELLANEOUS) ×2
POWER MODULE NUVASIVE (MISCELLANEOUS) ×2
ROD RELINE-O LORDOTIC 5.5X60MM (Rod) ×2 IMPLANT
SCREW LOCK RELINE 5.5 TULIP (Screw) ×6 IMPLANT
SCREW RELINE-O POLY 6.5X45 (Screw) ×4 IMPLANT
SCREW RELINE-O POLY 6.5X50MM (Screw) ×2 IMPLANT
SPONGE LAP 4X18 X RAY DECT (DISPOSABLE) IMPLANT
SPONGE SURGIFOAM ABS GEL 100 (HEMOSTASIS) ×1 IMPLANT
SUT PROLENE 6 0 BV (SUTURE) IMPLANT
SUT VIC AB 1 CT1 18XBRD ANBCTR (SUTURE) ×1 IMPLANT
SUT VIC AB 1 CT1 8-18 (SUTURE) ×2
SUT VIC AB 2-0 CP2 18 (SUTURE) ×2 IMPLANT
SUT VIC AB 3-0 SH 8-18 (SUTURE) ×3 IMPLANT
SYR 3ML LL SCALE MARK (SYRINGE) ×9 IMPLANT
SYR 5ML LL (SYRINGE) IMPLANT
TOWEL GREEN STERILE (TOWEL DISPOSABLE) ×2 IMPLANT
TOWEL GREEN STERILE FF (TOWEL DISPOSABLE) ×2 IMPLANT
TRAY FOLEY W/METER SILVER 16FR (SET/KITS/TRAYS/PACK) ×2 IMPLANT
WATER STERILE IRR 1000ML POUR (IV SOLUTION) ×2 IMPLANT

## 2017-06-04 NOTE — Anesthesia Procedure Notes (Signed)
Procedure Name: Intubation Date/Time: 06/04/2017 7:45 AM Performed by: Harden Mo, CRNA Pre-anesthesia Checklist: Patient identified, Emergency Drugs available, Suction available and Patient being monitored Patient Re-evaluated:Patient Re-evaluated prior to induction Oxygen Delivery Method: Circle System Utilized Preoxygenation: Pre-oxygenation with 100% oxygen Induction Type: IV induction Ventilation: Mask ventilation without difficulty Laryngoscope Size: Miller and 2 Grade View: Grade I Tube type: Oral Tube size: 7.5 mm Number of attempts: 1 Airway Equipment and Method: Stylet and Oral airway Placement Confirmation: ETT inserted through vocal cords under direct vision,  positive ETCO2 and breath sounds checked- equal and bilateral Secured at: 21 cm Tube secured with: Tape Dental Injury: Teeth and Oropharynx as per pre-operative assessment

## 2017-06-04 NOTE — Progress Notes (Signed)
Pt c/o of numbness in the left arm with some pain from the elbow to the hand. Informed Pt that it was probably from the positioning during surgery but I would call MD. Spoke with Dr. Ellene Route and he said place heat pack to the extremity and he will come check her later. Will continue to monitor Pt. Holli Humbles, RN

## 2017-06-04 NOTE — Op Note (Signed)
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 Neurogenic claudication, lumbar radiculopathy. Postoperative diagnosis: Same Procedure: Lumbar laminectomy L2-3 and L3-4 with decompression of the L2 the L3 the L4 nerve roots. More work than required for simple interbody arthrodesis. Posterior lumbar interbody arthrodesis using local autograft and allograft peek spacers, infuse. L2-3 L3-4. Segment of fixation L2-L4 with pedicle screws posterior lateral arthrodesis with local autograft allograft and infuse. Removal of previous hardware from L4 to the sacrum. Surgeon: Kristeen Miss First assistant: Francesca Jewett M.D. Anesthesia: Gen. endotracheal Indications: Gen Ketchem is a 61 year old individual who several years ago had decompressions and fusion surgery at L4 to the sacrum. She did well for while but then started developed adjacent level disease with pain and increasing stenosis. She was advised regarding surgery but declined at the time. Her symptoms on gotten worse. Patient now has severe spondylosis and stenosis at L2-3 and L3-4. She's being returned to the operating room to undergo surgical decompression and stabilization from L2-L4 in addition to removal of the previously placed hardware from L4 to the sacrum.  Procedure: The patient was brought to the operating room supine on a stretcher. After the smooth induction of general endotracheal anesthesia she was turned prone. The back was prepped with alcohol and DuraPrep and draped in a sterile fashion. Midline incision was created over the superior portion of the old incision and extended cephalad. Dissection was carried down to the lumbar dorsal fascia. The old hardware was identified. This was stripped and then the locking caps were removed. The rods were removed. Individual screws were removed. The S1 screws were noted to be fractured. After removing  the top part of the screw a small drill was used to ream around the screws and use a study extracted treatment remove the remainder of the screw in the vertebral body L5-S1. Once this was accomplished the area was checked for hemostasis packed off with a sponge and then the surgery was continued cephalad. L2-3 and L3-4 was identified and decompressed. Laminectomy was completed removing the entire marginal lamina of L2 out to and including the entirety of the facet at L2-3. Entire laminar arch of L3-4 was removed including the the facets at the L3-4 level. The thickened redundant yellow ligament in this area was removed and a good central decompression of the spinal canal was achieved. Then the individual nerves were decompressed using a to 3 and 4 mm Kerrison punch. The disc spaces were isolated. There is noted to be bilobed herniation of the disc in each foramen at L2-3 and L3-4. The disc space was then isolated in the disc space was entered first at L3-4 using a series of reamers to remove a substantial quantities of severely degenerated and desiccated disc material. The interbody space was then opened and by working from side to side the space could be gradually evacuated of all the degenerated disc out to and including the anterior longitudinal ligament. Ligament was left intact. The endplates were then decompressed and rongeured smooth to allow good surface for grafting. 8 mm tall 8 lordotic 23 mm long spacers were filled with autograft allograft and infuse and placed into the interspace along with 9 mL of bone graft at L3-4. At L2-3 a similar process was carried out and here 9 mL of bone graft was used into the interspace along with the same size spacers. Pedicle entry sites were then chosen at the L2 and L3 using  fluoroscopic guidance with 6.5 x 45 mm screws. 6.5 x 50 mm screws were placed in L4. A precontoured 60 mm rod was then used to connect the screw heads. Prior to tightening the patient had the head  of the bed extended so as to allow for some increased lordosis. This then allowed the screws to be locked them in a neutral construct in an extended position. A transverse connector was placed between L3 and L4. The lateral gutters which were previously decorticated within packed with the remainder of autograft allograft and infuse. In the end the canal is well decompressed the pads of the L2 the L3 and L4 nerve roots were checked for their decompression hemostasis was obtained meticulously and then the lumbar dorsal fascia was closed with #1 Vicryl interrupted fashion 2-0 Vicryl was used in the subcutaneous tissues and 3-0 Vicryl subcuticularly. Dermabond was used on the skin. Blood loss was estimated at 600 mL, 170 mL of Cell Saver blood was returned to the patient.

## 2017-06-04 NOTE — Transfer of Care (Signed)
Immediate Anesthesia Transfer of Care Note  Patient: Michaela Raymond  Procedure(s) Performed: Lumbar two-three, Lumbar three-four  Posterior lumbar interbody fusion with removal of old hardware (N/A Back)  Patient Location: PACU  Anesthesia Type:General  Level of Consciousness: awake and alert   Airway & Oxygen Therapy: Patient Spontanous Breathing  Post-op Assessment: Report given to RN, Post -op Vital signs reviewed and stable and Patient moving all extremities X 4  Post vital signs: Reviewed and stable  Last Vitals:  Vitals:   06/04/17 0615  BP: 132/64  Pulse: (!) 57  Resp: 20  Temp: 36.7 C  SpO2: 94%    Last Pain:  Vitals:   06/04/17 0615  TempSrc: Oral  PainSc:       Patients Stated Pain Goal: 5 (17/61/60 [id number])  Complications: No apparent anesthesia complications

## 2017-06-04 NOTE — H&P (Signed)
Chief Complaint: Chronic back pain.  HISTORY OF PRESENT ILLNESS: Michaela Raymond is a 61 year old right-handed individual who tells me that she has had back issues in the past and back in 2010 She had surgery to decompress and stabilize L4 to the sacrum by Dr. Earle Gell. Within a year, she had further problems and a repeat study demonstrated that she had some worsening stenosis at the level of L3-L4, but she was not keen on further surgery. 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 She is also having knee replacement on the right side. She tells me that she had gastric bypass surgery in 2013, which allowed her to lose over 100 pounds of weight, and she is very mindful and conscious of her health, exercising regularly, and wanting to stay physically active. She notes that she has had chronically increasing back pain, has been treated by Dr. Laurence Spates, who has done a number of ablations and injections to help control pain, but lately she has been becoming increasingly refractory with increasing centralized back pain and stiffness in the muscles of her legs. She has not had any recent studies, and today in the office we obtained an AP and lateral radiograph of the lumbar spine with a flexion-extension view. These radiographs demonstrate that she has a 4 mm anterolisthesis at L3 on L4 above her fusion. There appear to be fractures of both S1 screws, though the mass of fusion in her lumbar spine appears to be solid. She does have a slight angulation off to the left side above her fusion, but no frank scoliotic curvature is noted. There is moderate collapse of the disc space. There is also a rim of calcification in the pelvis from a calcified uterine fibroid, which she has been aware of for the past number of years also. This mass has been stable in size.  PAST MEDICAL HISTORY: Notable for  some hypertension. We note the gastric bypass surgery. Hypothyroidism is also noted.  CURRENT MEDICATIONS: Include Losartan, Aleve, Gabapentin, Oestrone, Pantoprazole, Singulair, Centrum Vitamin, Citracal with D and Biotin supplementation.  SYSTEMS REVIEW: Notable for history of cataracts, some balance disturbance, leg pain while walking, leg and back pain, thyroid disease, some anxiety, difficulty with coordination in the arms and legs. This is all noted on a 14-point review sheet.  PHYSICAL EXAMINATION: I note that she stands straight and walks erect without any evidence of antalgia. Her motor function appears intact in the major groups including the iliopsoas, the quads, tibialis anterior and gastrocs. Her reflexes are symmetrically depressed in the patellae and the Achilles.  Impression: Michaela Raymond had the myelogram completed. The study demonstrates that she has severe adjacent level disease at L3-L4 with a complete myelographic block at that level. She also has moderately severe stenosis at the level of L2-L3 above that, where she has a broad-based bulge of the disc, and concentric hypertrophy of the facet joints causing moderate stenosis. I demonstrated these findings to her on the CT scan, and the myelogram images. I believe that Michaela Raymond will require surgical decompression of L2-3 and L3-4 with removal of the entirety of the disc, placement of posterior interbody grafts and spacers to hold alignment of the spine, and then placing pedicle screws at L2, L3, and L4.   The hardware from the lower aspects of her fusion, down to the sacrum will be removed. The  surgery itself can require about 4 hours of general anesthesia and afterwards Michaela Raymond will be required to use an external corset for a period of up to 8 weeks. During that time, she will be healing and hopefully feeling considerably better. We discussed her postoperative activities, and Michaela Raymond particularly enjoys horseback riding. I noted that she  could with good healing get back on a horse in 4 months' time. She is concerned though about her history of osteopenia, and how that affects her ability to fuse, and I noted that during the surgery we will use some bone morphogenic protein in the form of infuse to help stimulate good bone growth. She is already taking calcium and Vitamin D supplementation. I am hopeful that she should heal well from this procedure and do well in the long run, but the surgery is something that she notes she will be requiring and she is making arrangements now to have her daughter available to help some during her early recovery. She is admitted for surgery.

## 2017-06-04 NOTE — Anesthesia Postprocedure Evaluation (Signed)
Anesthesia Post Note  Patient: Michaela Raymond  Procedure(s) Performed: Lumbar two-three, Lumbar three-four  Posterior lumbar interbody fusion with removal of old hardware (N/A Back)     Patient location during evaluation: PACU Anesthesia Type: General Level of consciousness: awake and alert, oriented and patient cooperative Pain management: pain level controlled Vital Signs Assessment: post-procedure vital signs reviewed and stable Respiratory status: spontaneous breathing, nonlabored ventilation, respiratory function stable and patient connected to nasal cannula oxygen Cardiovascular status: blood pressure returned to baseline and stable Postop Assessment: no apparent nausea or vomiting Anesthetic complications: no    Last Vitals:  Vitals:   06/04/17 1515 06/04/17 1530  BP: 121/78 126/82  Pulse:    Resp: 19 11  Temp:    SpO2: 93% 96%    Last Pain:  Vitals:   06/04/17 1515  TempSrc:   PainSc: Asleep    LLE Motor Response: Purposeful movement;Responds to commands (06/04/17 1530) LLE Sensation: Full sensation;No numbness;No pain;No tingling (06/04/17 1530) RLE Motor Response: Purposeful movement;Responds to commands (06/04/17 1530) RLE Sensation: Full sensation;No numbness;No pain;No tingling (06/04/17 1530)      Seleta Rhymes. Hannah Crill

## 2017-06-04 NOTE — Progress Notes (Signed)
CPAP is bedside and ready for use. Patient said she will place on herself when ready for bed and does not need any further assistance.

## 2017-06-05 LAB — BASIC METABOLIC PANEL
Anion gap: 10 (ref 5–15)
BUN: 13 mg/dL (ref 6–20)
CHLORIDE: 102 mmol/L (ref 101–111)
CO2: 23 mmol/L (ref 22–32)
CREATININE: 0.86 mg/dL (ref 0.44–1.00)
Calcium: 8 mg/dL — ABNORMAL LOW (ref 8.9–10.3)
Glucose, Bld: 121 mg/dL — ABNORMAL HIGH (ref 65–99)
Potassium: 3.9 mmol/L (ref 3.5–5.1)
SODIUM: 135 mmol/L (ref 135–145)

## 2017-06-05 LAB — CBC
HCT: 24.4 % — ABNORMAL LOW (ref 36.0–46.0)
HEMOGLOBIN: 7.4 g/dL — AB (ref 12.0–15.0)
MCH: 24.1 pg — ABNORMAL LOW (ref 26.0–34.0)
MCHC: 30.3 g/dL (ref 30.0–36.0)
MCV: 79.5 fL (ref 78.0–100.0)
PLATELETS: 281 10*3/uL (ref 150–400)
RBC: 3.07 MIL/uL — AB (ref 3.87–5.11)
RDW: 17.3 % — ABNORMAL HIGH (ref 11.5–15.5)
WBC: 10.8 10*3/uL — ABNORMAL HIGH (ref 4.0–10.5)

## 2017-06-05 MED ORDER — MAGIC MOUTHWASH
5.0000 mL | Freq: Three times a day (TID) | ORAL | Status: DC
  Administered 2017-06-05 – 2017-06-06 (×2): 5 mL via ORAL
  Filled 2017-06-05 (×4): qty 5

## 2017-06-05 MED ORDER — OXYCODONE-ACETAMINOPHEN 5-325 MG PO TABS
1.0000 | ORAL_TABLET | ORAL | Status: DC | PRN
  Administered 2017-06-05 (×3): 2 via ORAL
  Administered 2017-06-06: 1 via ORAL
  Administered 2017-06-06: 2 via ORAL
  Administered 2017-06-06: 1 via ORAL
  Filled 2017-06-05: qty 2
  Filled 2017-06-05 (×2): qty 1
  Filled 2017-06-05 (×3): qty 2

## 2017-06-05 MED ORDER — FLUCONAZOLE 100 MG PO TABS
100.0000 mg | ORAL_TABLET | Freq: Every day | ORAL | Status: DC
  Administered 2017-06-05 – 2017-06-06 (×2): 100 mg via ORAL
  Filled 2017-06-05 (×2): qty 1

## 2017-06-05 MED FILL — Sodium Chloride IV Soln 0.9%: INTRAVENOUS | Qty: 1000 | Status: AC

## 2017-06-05 MED FILL — Heparin Sodium (Porcine) Inj 1000 Unit/ML: INTRAMUSCULAR | Qty: 30 | Status: AC

## 2017-06-05 NOTE — Plan of Care (Signed)
  Progressing Pain Management: Pain level will decrease 06/05/2017 2035 - Progressing by Charlena Cross, RN   Completed/Met Safety: Ability to remain free from injury will improve 06/05/2017 2035 - Completed/Met by Pricilla Holm, Rodrecus Belsky D, RN Activity: Ability to avoid complications of mobility impairment will improve 06/05/2017 2035 - Completed/Met by Pricilla Holm, Kierre Deines D, RN Ability to tolerate increased activity will improve 06/05/2017 2035 - Completed/Met by Charlena Cross, RN Will remain free from falls 06/05/2017 2035 - Completed/Met by Pricilla Holm, Lakeva Hollon D, RN Education: Ability to verbalize activity precautions or restrictions will improve 06/05/2017 2035 - Completed/Met by Charlena Cross, RN Physical Regulation: Postoperative complications will be avoided or minimized 06/05/2017 2035 - Completed/Met by Charlena Cross, RN Bladder/Genitourinary: Urinary functional status for postoperative course will improve 06/05/2017 2035 - Completed/Met by Charlena Cross, RN

## 2017-06-05 NOTE — Evaluation (Signed)
Physical Therapy Evaluation & Discharge  Patient Details Name: Michaela Raymond MRN: [id number] DOB: 1956-01-22 Today's Date: 06/05/2017   History of Present Illness  Pt is a 61 y/o female s/p Lumbar laminectomy L2-3 and L3-4 with decompression of the L2 the L3 the L4 nerve roots. PMH including but not limited to HTN and gastric bypass surgery.  Clinical Impression  Pt presented sitting OOB in recliner chair, awake and willing to participate in therapy session. Prior to admission, pt reported that she was independent with all functional mobility and ADLs. Pt lives alone but will have her daughter at home with her 24/7 until the 8th of January. Pt ambulated in hallway without use of an AD with supervision for safety. Pt also successfully completed stair training this session with no concerns. No further acute PT needs identified at this time. PT signing off.     Follow Up Recommendations No PT follow up    Equipment Recommendations  None recommended by PT    Recommendations for Other Services       Precautions / Restrictions Precautions Precautions: Back Precaution Booklet Issued: No Precaution Comments: PT reviewed 3/3 back precautions with pt throughout Required Braces or Orthoses: Spinal Brace Spinal Brace: Lumbar corset;Applied in sitting position Restrictions Weight Bearing Restrictions: No      Mobility  Bed Mobility               General bed mobility comments: pt sitting OOB in recliner chair upon arrival  Transfers Overall transfer level: Modified independent Equipment used: None                Ambulation/Gait Ambulation/Gait assistance: Supervision Ambulation Distance (Feet): 500 Feet Assistive device: None Gait Pattern/deviations: Step-through pattern;Drifts right/left Gait velocity: decreased Gait velocity interpretation: Below normal speed for age/gender General Gait Details: mild instability but no overt LOB or need for physical  assistance  Stairs Stairs: Yes Stairs assistance: Supervision Stair Management: One rail Left;Alternating pattern;Forwards Number of Stairs: 12 General stair comments: no instability or LOB, no concerns  Wheelchair Mobility    Modified Rankin (Stroke Patients Only)       Balance Overall balance assessment: Needs assistance Sitting-balance support: Feet supported Sitting balance-Leahy Scale: Good     Standing balance support: During functional activity;No upper extremity supported Standing balance-Leahy Scale: Good                               Pertinent Vitals/Pain Pain Assessment: 0-10 Pain Score: 7  Pain Location: back Pain Descriptors / Indicators: Sore;Guarding Pain Intervention(s): Monitored during session;Repositioned    Home Living Family/patient expects to be discharged to:: Private residence Living Arrangements: Alone Available Help at Discharge: Family;Available 24 hours/day;Other (Comment)(daughter will be with pt 24/7 until January 8th) Type of Home: House Home Access: Stairs to enter Entrance Stairs-Rails: Psychiatric nurse of Steps: 3 Home Layout: Two level Home Equipment: Shower seat;Hand held shower head;Cane - single point      Prior Function Level of Independence: Independent               Hand Dominance        Extremity/Trunk Assessment   Upper Extremity Assessment Upper Extremity Assessment: Defer to OT evaluation    Lower Extremity Assessment Lower Extremity Assessment: Overall WFL for tasks assessed    Cervical / Trunk Assessment Cervical / Trunk Assessment: Other exceptions Cervical / Trunk Exceptions: s/p lumbar sx  Communication   Communication: No  difficulties  Cognition Arousal/Alertness: Awake/alert Behavior During Therapy: WFL for tasks assessed/performed Overall Cognitive Status: Within Functional Limits for tasks assessed                                         General Comments      Exercises     Assessment/Plan    PT Assessment Patent does not need any further PT services  PT Problem List         PT Treatment Interventions      PT Goals (Current goals can be found in the Care Plan section)  Acute Rehab PT Goals Patient Stated Goal: return home    Frequency     Barriers to discharge        Co-evaluation               AM-PAC PT "6 Clicks" Daily Activity  Outcome Measure Difficulty turning over in bed (including adjusting bedclothes, sheets and blankets)?: A Little Difficulty moving from lying on back to sitting on the side of the bed? : A Little Difficulty sitting down on and standing up from a chair with arms (e.g., wheelchair, bedside commode, etc,.)?: None Help needed moving to and from a bed to chair (including a wheelchair)?: None Help needed walking in hospital room?: None Help needed climbing 3-5 steps with a railing? : None 6 Click Score: 22    End of Session Equipment Utilized During Treatment: Back brace Activity Tolerance: Patient tolerated treatment well Patient left: in chair;with call bell/phone within reach Nurse Communication: Mobility status PT Visit Diagnosis: Other abnormalities of gait and mobility (R26.89);Pain Pain - part of body: (back)    Time: 5009-3818 PT Time Calculation (min) (ACUTE ONLY): 11 min   Charges:   PT Evaluation $PT Eval Low Complexity: 1 Low     PT G Codes:        Oval, PT, DPT [place] 06/05/2017, 8:53 AM

## 2017-06-05 NOTE — Progress Notes (Signed)
Patient ID: Michaela Raymond, female   DOB: October 04, 1955, 61 y.o.   MRN: [id number] I'll signs are stable Motor function appears intact Dressing is clean and dry Patient's hemoglobin is 7.0 This is more than a 3 point drop from her preoperative hemoglobin of 10.5 She seems to be tolerating this well at the current time I've encouraged ambulation Follow for 1 more day in hospital for discharge

## 2017-06-05 NOTE — Evaluation (Signed)
Occupational Therapy Evaluation and Discharge Patient Details Name: Michaela Raymond MRN: [id number] DOB: 04/12/1956 Today's Date: 06/05/2017    History of Present Illness Pt is a 61 y/o female s/p Lumbar laminectomy L2-3 and L3-4 with decompression of the L2 the L3 the L4 nerve roots. PMH including but not limited to HTN and gastric bypass surgery.   Clinical Impression   Pt reports she was independent with ADL PTA. Currently pt supervision with ADL and functional mobility. All back, safety, and ADL education completed with pt. Pt planning to d/c home with 24/7 supervision from family initially. No further acute OT needs identified; signing off at this time. Please re-consult if needs change. Thank you for this referral.    Follow Up Recommendations  No OT follow up;Supervision/Assistance - 24 hour(initially)    Equipment Recommendations  None recommended by OT    Recommendations for Other Services       Precautions / Restrictions Precautions Precautions: Back Precaution Booklet Issued: No Precaution Comments: Reviewed 3/3 back precautions with pt. Required Braces or Orthoses: Spinal Brace Spinal Brace: Lumbar corset;Applied in sitting position Restrictions Weight Bearing Restrictions: No      Mobility Bed Mobility               General bed mobility comments: Pt OOB in chair upon arrival  Transfers Overall transfer level: Needs assistance Equipment used: None Transfers: Sit to/from Stand Sit to Stand: Supervision         General transfer comment: for safety    Balance Overall balance assessment: Needs assistance Sitting-balance support: Feet supported;No upper extremity supported Sitting balance-Leahy Scale: Good     Standing balance support: No upper extremity supported;During functional activity Standing balance-Leahy Scale: Good                             ADL either performed or assessed with clinical judgement   ADL Overall ADL's :  Needs assistance/impaired Eating/Feeding: Set up;Sitting   Grooming: Supervision/safety;Standing Grooming Details (indicate cue type and reason): Educated on use of 2 cups for oral care Upper Body Bathing: Set up;Sitting   Lower Body Bathing: Supervison/ safety;Sit to/from stand   Upper Body Dressing : Set up;Sitting Upper Body Dressing Details (indicate cue type and reason): pt reports she is able to manage back brace without assist Lower Body Dressing: Supervision/safety;Sit to/from stand Lower Body Dressing Details (indicate cue type and reason): Educated on compensatory strategies for LB ADL. Pt able to cross foot over opposite knee Toilet Transfer: Supervision/safety;Ambulation;Regular Glass blower/designer Details (indicate cue type and reason): simulated by sit to stand from chair with functional mobility   Toileting - Clothing Manipulation Details (indicate cue type and reason): educated on proper technique for peri care without twisting and use of wet wipes Tub/ Shower Transfer: Supervision/safety;Walk-in shower;Ambulation;Shower Scientist, research (medical) Details (indicate cue type and reason): educated on supervision for safety initially upon return home Functional mobility during ADLs: Supervision/safety General ADL Comments: Educated pt on maintaining back precautions during functional activities, brace wear schedule, keeping frequently used items at counter top height, frequent mobility throughout the day upon return home.     Vision         Perception     Praxis      Pertinent Vitals/Pain Pain Assessment: Faces Pain Score: 7  Faces Pain Scale: Hurts even more Pain Location: back Pain Descriptors / Indicators: Sore Pain Intervention(s): Monitored during session;Patient requesting pain meds-RN notified  Hand Dominance     Extremity/Trunk Assessment Upper Extremity Assessment Upper Extremity Assessment: Overall WFL for tasks assessed   Lower Extremity  Assessment Lower Extremity Assessment: Defer to PT evaluation   Cervical / Trunk Assessment Cervical / Trunk Assessment: Other exceptions Cervical / Trunk Exceptions: s/p lumbar sx   Communication Communication Communication: No difficulties   Cognition Arousal/Alertness: Awake/alert Behavior During Therapy: WFL for tasks assessed/performed Overall Cognitive Status: Within Functional Limits for tasks assessed                                     General Comments       Exercises     Shoulder Instructions      Home Living Family/patient expects to be discharged to:: Private residence Living Arrangements: Alone Available Help at Discharge: Family;Available 24 hours/day;Other (Comment)(daughter staying until 1/8) Type of Home: House Home Access: Stairs to enter [employer] of Steps: 3 Entrance Stairs-Rails: Right;Left Home Layout: Two level;Bed/bath upstairs Alternate Level Stairs-Number of Steps: flight Alternate Level Stairs-Rails: Right;Left Bathroom Shower/Tub: Occupational psychologist: Standard     Home Equipment: Shower seat;Hand held shower head;Cane - single point;Adaptive equipment Adaptive Equipment: Reacher        Prior Functioning/Environment Level of Independence: Independent                 OT Problem List:        OT Treatment/Interventions:      OT Goals(Current goals can be found in the care plan section) Acute Rehab OT Goals Patient Stated Goal: return home OT Goal Formulation: All assessment and education complete, DC therapy  OT Frequency:     Barriers to D/C:            Co-evaluation              AM-PAC PT "6 Clicks" Daily Activity     Outcome Measure Help from another person eating meals?: None Help from another person taking care of personal grooming?: A Little Help from another person toileting, which includes using toliet, bedpan, or urinal?: A Little Help from another person bathing  (including washing, rinsing, drying)?: A Little Help from another person to put on and taking off regular upper body clothing?: None Help from another person to put on and taking off regular lower body clothing?: A Little 6 Click Score: 20   End of Session Equipment Utilized During Treatment: Back brace Nurse Communication: Mobility status;Patient requests pain meds;Other (comment)(no equipment or f/u needs)  Activity Tolerance: Patient tolerated treatment well Patient left: in chair;with call bell/phone within reach  OT Visit Diagnosis: Unsteadiness on feet (R26.81)                Time: 1572-6203 OT Time Calculation (min): 10 min Charges:  OT General Charges $OT Visit: 1 Visit OT Evaluation $OT Eval Low Complexity: 1 Low G-Codes:     Derinda Bartus A. Ulice Brilliant, M.S., OTR/L Pager: [place] 06/05/2017, 9:34 AM

## 2017-06-05 NOTE — Progress Notes (Signed)
Pt placing herself on CPAP- pt tolerating well.  Encouraged pt to call if she needs anything.

## 2017-06-06 MED ORDER — OXYCODONE-ACETAMINOPHEN 5-325 MG PO TABS: 1.0000 | ORAL_TABLET | ORAL | 0 refills | Status: DC | PRN

## 2017-06-06 MED ORDER — CYCLOBENZAPRINE HCL 10 MG PO TABS: 10.0000 mg | ORAL_TABLET | Freq: Three times a day (TID) | ORAL | 5 refills | Status: DC | PRN

## 2017-06-06 NOTE — Discharge Summary (Signed)
Physician Discharge Summary  Patient ID: Michaela Raymond MRN: [id number] DOB/AGE: [birth year] 61 y.o.  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 Neurogenic claudication. Lumbar radiculopathy. History of fusion L4 to sacrum.  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 Neurogenic claudication. Lumbar radiculopathy. History of fusion L4 to sacrum. Acute blood loss anemia. Active Problems:   Lumbar adjacent segment disease with spondylolisthesis   Discharged Condition: good  Hospital Course: Patient was admitted to undergo surgical decompression and stabilization from L2-L4 which she tolerated well. She had acute loss of blood greater than 3 g secondary to surgery. She did not require transfusion.  Consults: None  Significant Diagnostic Studies: None  Treatments: surgery: Decompression and fusion L2-3 L3-4 with posterior lumbar interbody arthrodesis pedicle screw fixation L2-L4. Removal of previous hardware L4 to the sacrum.  Discharge Exam: Blood pressure (!) 117/51, pulse 77, temperature 98.7 F (37.1 C), resp. rate 18, SpO2 100 %. Incision is clean and dry. Station and gait are intact.  Disposition: 01-Home or Self Care  Discharge Instructions    Call MD for:  redness, tenderness, or signs of infection (pain, swelling, redness, odor or green/yellow discharge around incision site)   Complete by:  As directed    Call MD for:  severe uncontrolled pain   Complete by:  As directed    Call MD for:  temperature >100.4   Complete by:  As directed    Diet - low sodium heart healthy   Complete by:  As directed    Discharge instructions   Complete by:  As directed    Okay to shower. Do not apply salves or appointments to incision. No heavy lifting with the upper extremities greater than 15 pounds. May resume driving when not requiring pain medication and patient feels comfortable with doing so.   Incentive spirometry RT   Complete by:  As directed    Increase activity slowly   Complete by:  As directed      Allergies as of 06/06/2017      Reactions   Sulfa Antibiotics Anaphylaxis, Hives, Itching, Swelling   Tape Itching   Hives. Only use paper or cloth tape   Zithromax [azithromycin] Nausea And Vomiting   Severe Abdominal cramping   Coreg    UNSPECIFIED REACTION    Fenofibrate    UNSPECIFIED REACTION    Ibuprofen    UNSPECIFIED REACTION    Verapamil    UNSPECIFIED REACTION    Asa [aspirin] Other (See Comments)   GI upset   Escitalopram Oxalate Anxiety   Panic attack   Talwin [pentazocine Lactate] Nausea And Vomiting      Medication List    TAKE these medications   alosetron 0.5 MG tablet Commonly known as:  LOTRONEX Take 0.5 mg by mouth 2 (two) times daily.   ALPRAZolam 0.5 MG tablet Commonly known as:  XANAX Take 0.25 mg by mouth at bedtime.   amoxicillin 500 MG capsule Commonly known as:  AMOXIL Take 2,000 mg by mouth See admin instructions. Take 1 hour prior to dental procedures   Biotin 1000 MCG tablet Take 1,000 mcg by mouth daily.   calcium citrate-vitamin D 315-200 MG-UNIT tablet Commonly known as:  CITRACAL+D Take 2 tablets by mouth 2 (two) times daily.   CLIMARA PRO 0.045-0.015 MG/DAY Generic drug:  estradiol-levonorgestrel Place 1 patch onto the skin once a week. Tuesdays   cyclobenzaprine 10 MG tablet Commonly known as:  FLEXERIL Take 1 tablet (10 mg  total) by mouth 3 (three) times daily as needed for muscle spasms. What changed:  Another medication with the same name was added. Make sure you understand how and when to take each.   cyclobenzaprine 10 MG tablet Commonly known as:  FLEXERIL Take 1 tablet (10 mg total) by mouth 3 (three) times daily as needed for muscle spasms. What changed:  You were already taking a medication with the same name, and this prescription was added. Make sure you understand how and when to take each.   D  5000 5000 units Tabs Generic drug:  Cholecalciferol Take 5,000 Units by mouth daily.   fluticasone 50 MCG/ACT nasal spray Commonly known as:  FLONASE Place 2 sprays into both nostrils daily. PATIENT NEEDS OFFICE VISIT FOR ADDITIONAL REFILLS   gabapentin 100 MG capsule Commonly known as:  NEURONTIN Take 100 mg by mouth daily. 100mg  each morning, 300mg  each evening   gabapentin 300 MG capsule Commonly known as:  NEURONTIN Take 300 mg by mouth at bedtime.   HYDROcodone-acetaminophen 5-325 MG tablet Commonly known as:  NORCO/VICODIN Take 1 tablet by mouth every 6 (six) hours as needed for moderate pain.   levocetirizine 5 MG tablet Commonly known as:  XYZAL Take 5 mg by mouth at bedtime.   levothyroxine 137 MCG tablet Commonly known as:  SYNTHROID, LEVOTHROID Take 137 mcg by mouth daily before breakfast.   lidocaine 5 % Commonly known as:  LIDODERM Place 1-2 patches onto the skin daily. Remove & Discard patch within 12 hours. PATIENT NEEDS OFFICE VISIT FOR MORE What changed:    how much to take  additional instructions   losartan 50 MG tablet Commonly known as:  COZAAR Take 50 mg by mouth daily.   montelukast 10 MG tablet Commonly known as:  SINGULAIR Take 1 tablet (10 mg total) by mouth at bedtime. PATIENT NEEDS OFFICE VISIT FOR ADDITIONAL REFILLS - 2nd NOTICE   multivitamin tablet Take 1 tablet by mouth daily.   naproxen sodium 220 MG tablet Commonly known as:  ALEVE Take 440 mg by mouth 2 (two) times daily with a meal.   oxyCODONE-acetaminophen 5-325 MG tablet Commonly known as:  PERCOCET/ROXICET Take 1-2 tablets by mouth every 4 (four) hours as needed for severe pain.   pantoprazole 20 MG tablet Commonly known as:  PROTONIX Take 20 mg by mouth daily.   sucralfate 1 GM/10ML suspension Commonly known as:  CARAFATE Take 1 g by mouth 3 (three) times daily as needed (when taking NSAIDS).   TYLENOL 8 HOUR 650 MG CR tablet Generic drug:  acetaminophen Take  1,300 mg by mouth 2 (two) times daily.        SignedEarleen Newport 06/06/2017, 9:05 AM

## 2017-06-06 NOTE — Progress Notes (Signed)
Pt doing well. Pt given D/C instructions with Rx's, verbal understanding was provided. Pt's incision is open to air with no sign of infection. Pt's IV was removed prior to D/C. Pt D/C'd home via wheelchair @ 1215 per MD order. Pt is stable @ D/C and has no other needs at this time. Holli Humbles, RN

## 2017-07-11 ENCOUNTER — Ambulatory Visit (HOSPITAL_COMMUNITY)
Admission: RE | Admit: 2017-07-11 | Discharge: 2017-07-11 | Disposition: A | Discharge status: Home / Self Care | Payer: BLUE CROSS/BLUE SHIELD | Source: Ambulatory Visit | Attending: Gastroenterology | Admitting: Gastroenterology

## 2017-07-11 DIAGNOSIS — D509 Iron deficiency anemia, unspecified: Secondary | ICD-10-CM | POA: Diagnosis not present

## 2017-07-11 MED ORDER — SODIUM CHLORIDE 0.9 % IV SOLN
25.0000 mg | Freq: Once | INTRAVENOUS | Status: AC
  Administered 2017-07-11: 25 mg via INTRAVENOUS
  Filled 2017-07-11 (×2): qty 0.5

## 2017-07-11 MED ORDER — SODIUM CHLORIDE 0.9 % IV SOLN
1000.0000 mg | Freq: Once | INTRAVENOUS | Status: AC
Start: 2017-07-11 — End: 2017-07-11
  Administered 2017-07-11: 1000 mg via INTRAVENOUS
  Filled 2017-07-11 (×2): qty 20

## 2017-07-11 NOTE — Discharge Instructions (Signed)

## 2017-07-11 NOTE — Progress Notes (Signed)
PATIENT CARE CENTER NOTE  Diagnosis: Iron Deficiency anemia   Provider: Dr. Michail Sermon    Procedure: Infusion of Iron dextran complex (Infed)   Note: Patient received test does of Infed and tolerated it well with no adverse reaction. Patient then received full dose of Infed and tolerated the infusion well. Vitals taken and remained stable. Discharge instructions given to patient. Patient alert, oriented, ambulatory and in stable condition at discharge.

## 2017-10-18 ENCOUNTER — Other Ambulatory Visit: Payer: Self-pay

## 2017-10-18 ENCOUNTER — Emergency Department (HOSPITAL_COMMUNITY): Payer: BLUE CROSS/BLUE SHIELD

## 2017-10-18 ENCOUNTER — Emergency Department (HOSPITAL_COMMUNITY)
Admission: EM | Admit: 2017-10-18 | Discharge: 2017-10-18 | Disposition: A | Discharge status: Home / Self Care | Payer: BLUE CROSS/BLUE SHIELD | Attending: Emergency Medicine | Admitting: Emergency Medicine

## 2017-10-18 ENCOUNTER — Encounter (HOSPITAL_COMMUNITY): Payer: Self-pay | Admitting: Emergency Medicine

## 2017-10-18 DIAGNOSIS — S3210XA Unspecified fracture of sacrum, initial encounter for closed fracture: Secondary | ICD-10-CM | POA: Diagnosis present

## 2017-10-18 DIAGNOSIS — Y999 Unspecified external cause status: Secondary | ICD-10-CM | POA: Diagnosis not present

## 2017-10-18 DIAGNOSIS — Z96653 Presence of artificial knee joint, bilateral: Secondary | ICD-10-CM | POA: Diagnosis not present

## 2017-10-18 DIAGNOSIS — Z79899 Other long term (current) drug therapy: Secondary | ICD-10-CM | POA: Diagnosis not present

## 2017-10-18 DIAGNOSIS — Y929 Unspecified place or not applicable: Secondary | ICD-10-CM | POA: Insufficient documentation

## 2017-10-18 DIAGNOSIS — Y9352 Activity, horseback riding: Secondary | ICD-10-CM | POA: Insufficient documentation

## 2017-10-18 DIAGNOSIS — E039 Hypothyroidism, unspecified: Secondary | ICD-10-CM | POA: Insufficient documentation

## 2017-10-18 DIAGNOSIS — I129 Hypertensive chronic kidney disease with stage 1 through stage 4 chronic kidney disease, or unspecified chronic kidney disease: Secondary | ICD-10-CM | POA: Insufficient documentation

## 2017-10-18 DIAGNOSIS — N189 Chronic kidney disease, unspecified: Secondary | ICD-10-CM | POA: Insufficient documentation

## 2017-10-18 DIAGNOSIS — J449 Chronic obstructive pulmonary disease, unspecified: Secondary | ICD-10-CM | POA: Diagnosis not present

## 2017-10-18 DIAGNOSIS — F17210 Nicotine dependence, cigarettes, uncomplicated: Secondary | ICD-10-CM | POA: Diagnosis not present

## 2017-10-18 DIAGNOSIS — R1030 Lower abdominal pain, unspecified: Secondary | ICD-10-CM | POA: Insufficient documentation

## 2017-10-18 LAB — I-STAT CHEM 8, ED
BUN: 13 mg/dL (ref 6–20)
CALCIUM ION: 1.13 mmol/L — AB (ref 1.15–1.40)
CHLORIDE: 101 mmol/L (ref 101–111)
Creatinine, Ser: 0.8 mg/dL (ref 0.44–1.00)
Glucose, Bld: 85 mg/dL (ref 65–99)
HCT: 38 % (ref 36.0–46.0)
Hemoglobin: 12.9 g/dL (ref 12.0–15.0)
Potassium: 4 mmol/L (ref 3.5–5.1)
SODIUM: 138 mmol/L (ref 135–145)
TCO2: 26 mmol/L (ref 22–32)

## 2017-10-18 LAB — COMPREHENSIVE METABOLIC PANEL
ALT: 17 U/L (ref 14–54)
AST: 23 U/L (ref 15–41)
Albumin: 4 g/dL (ref 3.5–5.0)
Alkaline Phosphatase: 72 U/L (ref 38–126)
Anion gap: 8 (ref 5–15)
BILIRUBIN TOTAL: 0.4 mg/dL (ref 0.3–1.2)
BUN: 12 mg/dL (ref 6–20)
CO2: 27 mmol/L (ref 22–32)
Calcium: 8.8 mg/dL — ABNORMAL LOW (ref 8.9–10.3)
Chloride: 104 mmol/L (ref 101–111)
Creatinine, Ser: 0.78 mg/dL (ref 0.44–1.00)
GFR calc Af Amer: >60 mL/min (ref >60)
Glucose, Bld: 91 mg/dL (ref 65–99)
POTASSIUM: 4.2 mmol/L (ref 3.5–5.1)
Sodium: 139 mmol/L (ref 135–145)
TOTAL PROTEIN: 6.3 g/dL — AB (ref 6.5–8.1)

## 2017-10-18 LAB — CBC WITH DIFFERENTIAL/PLATELET
BASOS ABS: 0 10*3/uL (ref 0.0–0.1)
Basophils Relative: 0 %
Eosinophils Absolute: 0.2 10*3/uL (ref 0.0–0.7)
Eosinophils Relative: 2 %
HEMATOCRIT: 37.4 % (ref 36.0–46.0)
Hemoglobin: 11.9 g/dL — ABNORMAL LOW (ref 12.0–15.0)
LYMPHS ABS: 2.1 10*3/uL (ref 0.7–4.0)
LYMPHS PCT: 29 %
MCH: 27.7 pg (ref 26.0–34.0)
MCHC: 31.8 g/dL (ref 30.0–36.0)
MCV: 87 fL (ref 78.0–100.0)
MONO ABS: 0.4 10*3/uL (ref 0.1–1.0)
MONOS PCT: 6 %
NEUTROS ABS: 4.5 10*3/uL (ref 1.7–7.7)
Neutrophils Relative %: 63 %
Platelets: 352 10*3/uL (ref 150–400)
RBC: 4.3 MIL/uL (ref 3.87–5.11)
RDW: 18.7 % — AB (ref 11.5–15.5)
WBC: 7.2 10*3/uL (ref 4.0–10.5)

## 2017-10-18 MED ORDER — HYDROMORPHONE HCL 4 MG PO TABS: 2.0000 mg | ORAL_TABLET | ORAL | 0 refills | Status: DC | PRN

## 2017-10-18 MED ORDER — HYDROCODONE-ACETAMINOPHEN 10-325 MG PO TABS
1.0000 | ORAL_TABLET | Freq: Once | ORAL | Status: AC
  Administered 2017-10-18: 1 via ORAL
  Filled 2017-10-18: qty 1

## 2017-10-18 MED ORDER — IOHEXOL 300 MG/ML  SOLN
100.0000 mL | Freq: Once | INTRAMUSCULAR | Status: AC | PRN
  Administered 2017-10-18: 100 mL via INTRAVENOUS

## 2017-10-18 MED ORDER — HYDROMORPHONE HCL 2 MG PO TABS: 2.0000 mg | ORAL_TABLET | ORAL | 0 refills | Status: DC | PRN

## 2017-10-18 NOTE — ED Triage Notes (Signed)
Pt has hx of spinal fusion. Taking medications for pain and she was cleared for activity. Pt states she fell off a horse 1 week ago. Pt states she has increased her pain meds to 3 10-325 hydrocodones since the fall. Problems with urination and starting urination, also reports problems having a BM since the fall. Pain is waking her up. Denies hematuria. Bruising above rectum.

## 2017-10-18 NOTE — ED Notes (Signed)
Results reviewed, no changes in acuity at this time 

## 2017-10-18 NOTE — ED Provider Notes (Signed)
[facility] EMERGENCY DEPARTMENT Provider Note   CSN: [id number] Arrival date & time: 10/18/17  1458     History   Chief Complaint Chief Complaint  Patient presents with  . Fall from horse  . Abdominal Pain    HPI Michaela Raymond is a 62 y.o. female with a past medical history of morbid obesity, osteopenia, Barrett's esophagus she is status post gastric bypass surgery.  Patient states that she was thrown from a horse 4 days ago.  Patient states that she was able to walk after the injury but with significant pain.  She has a history of previous back surgery with Dr. Ellene Route.  She has been on hydrocodone 10 mg 1-2 times daily for several years.  Patient states that she has been taking her regularly prescribed pain medication but has also been taking Aleve since being thrown from the horse because she is in significant pain.  She states that she feels "like someone is poking her in the rectum with a hot poker."  Movement makes it worse.  She is been able to make soft stools without significant issue.  She states that she feels like she has some swelling and has had a little bit of difficulty starting her urinary stream but denies any numbness, tingling, overflow incontinence, loss of control of her bowel.  HPI  Past Medical History:  Diagnosis Date  . Adenoma    of thyroid gland, benign  . Anemia    hx. of with grastric bypass surgery  . Angina November 2011   Normal coronary angiogram; noncardiac symptom  . Anxiety   . Arthritis    osteoarthrits  . Asthma   . Barrett's esophagus   . Bronchitis   . Chronic kidney disease    cyst on right kidney hx of  . Complication of anesthesia    woke up during colonoscopy, twlight surgery with knee surgery pt. stopped breathing  and they did her under general  . COPD (chronic obstructive pulmonary disease) ([place])    pt. denies  . Dyspnea    only with heavy exercise  . Dysrhythmia   . Fibromyalgia   . GERD  (gastroesophageal reflux disease)   . Heart murmur   . Hyperlipidemia   . Hypertension   . Hypothyroidism   . Morbid obesity ([place])    Status post gastric bypass surgery September 2013  . Osteopenia   . Osteoporosis   . Pneumonia   . Sleep apnea    cpap machine x 9 years    Patient Active Problem List   Diagnosis Date Noted  . Lumbar adjacent segment disease with spondylolisthesis 06/04/2017  . Chronic pain syndrome 07/13/2016  . Post laminectomy syndrome 07/13/2016  . Spinal stenosis of lumbar region with neurogenic claudication 07/13/2016  . Tobacco abuse counseling 07/19/2015  . Essential hypertension 04/02/2013  . Hyperlipidemia   . History of Morbid obesity - status post gastric bypass surgery   . Other abnormal glucose 10/25/2011  . Other malaise and fatigue 10/25/2011  . Myalgia and myositis, unspecified 10/25/2011  . Degeneration of lumbar or lumbosacral intervertebral disc 10/25/2011  . Degeneration of cervical intervertebral disc 10/25/2011  . OSA on CPAP 10/25/2011  . GERD (gastroesophageal reflux disease) 10/25/2011  . IBS (irritable bowel syndrome) 10/25/2011  . COPD (chronic obstructive pulmonary disease) ([place]) 10/25/2011  . Other affections of shoulder region, not elsewhere classified 06/26/2011  . Post herpetic neuralgia 09/16/2003    Past Surgical History:  Procedure Laterality Date  .  BACK SURGERY     spinal fusion  . CARDIAC CATHETERIZATION  05/03/2010   No significant CAD in L coronary system, normal LV function  . CARDIOVASCULAR STRESS TEST  04/15/2007   EKG negative for ischemia, no significant ischemia demonstrated.  . COLONOSCOPY    . DILATION AND CURETTAGE OF UTERUS    . ESOPHAGOGASTRODUODENOSCOPY    . JOINT REPLACEMENT    . KNEE ARTHROPLASTY     bilaterally  . LAPAROSCOPIC ROUX-EN-Y GASTRIC BYPASS WITH UPPER ENDOSCOPY AND REMOVAL OF LAP BAND    . NASAL SINUS SURGERY     1989 and mucous cele removed  . ROTATOR CUFF REPAIR     2001  .  THYROIDECTOMY    . TONSILLECTOMY     and adnoids removed  . TRANSTHORACIC ECHOCARDIOGRAM  05/03/2021   EF 55-65%, normal     OB History   None      Home Medications    Prior to Admission medications   Medication Sig Start Date End Date Taking? Authorizing Provider  acetaminophen (TYLENOL 8 HOUR) 650 MG CR tablet Take 1,300 mg by mouth 2 (two) times daily.    [provider]  alosetron (LOTRONEX) 0.5 MG tablet Take 0.5 mg by mouth 2 (two) times daily.     [provider]  ALPRAZolam Duanne Moron) 0.5 MG tablet Take 0.25 mg by mouth at bedtime.    [provider]  amoxicillin (AMOXIL) 500 MG capsule Take 2,000 mg by mouth See admin instructions. Take 1 hour prior to dental procedures    [provider]  Biotin 1000 MCG tablet Take 1,000 mcg by mouth daily.     [provider]  calcium citrate-vitamin D (CITRACAL+D) 315-200 MG-UNIT tablet Take 2 tablets by mouth 2 (two) times daily.     [provider]  Cholecalciferol (D 5000) 5000 units TABS Take 5,000 Units by mouth daily.    [provider]  CLIMARA PRO 0.045-0.015 MG/DAY Place 1 patch onto the skin once a week. Tuesdays 06/29/14   [provider]  cyclobenzaprine (FLEXERIL) 10 MG tablet Take 1 tablet (10 mg total) by mouth 3 (three) times daily as needed for muscle spasms. 09/19/16   Magnus Sinning, MD  cyclobenzaprine (FLEXERIL) 10 MG tablet Take 1 tablet (10 mg total) by mouth 3 (three) times daily as needed for muscle spasms. 06/06/17   Kristeen Miss, MD  fluticasone (FLONASE) 50 MCG/ACT nasal spray Place 2 sprays into both nostrils daily. PATIENT NEEDS OFFICE VISIT FOR ADDITIONAL REFILLS    Collene Leyden, PA-C  gabapentin (NEURONTIN) 100 MG capsule Take 100 mg by mouth daily. 100mg  each morning, 300mg  each evening     [provider]  gabapentin (NEURONTIN) 300 MG capsule Take 300 mg by mouth at bedtime.    [provider]    HYDROcodone-acetaminophen (NORCO/VICODIN) 5-325 MG tablet Take 1 tablet by mouth every 6 (six) hours as needed for moderate pain.    [provider]  HYDROmorphone (DILAUDID) 2 MG tablet Take 1 tablet (2 mg total) by mouth every 4 (four) hours as needed. 10/18/17   Jalisha Enneking, Vernie Shanks, PA-C  levocetirizine (XYZAL) 5 MG tablet Take 5 mg by mouth at bedtime.  06/15/14   [provider]  levothyroxine (SYNTHROID, LEVOTHROID) 137 MCG tablet Take 137 mcg by mouth daily before breakfast.    [provider]  lidocaine (LIDODERM) 5 % Place 1-2 patches onto the skin daily. Remove & Discard patch within 12 hours. PATIENT NEEDS OFFICE VISIT  FOR MORE Patient taking differently: Place 1 patch onto the skin daily. Remove & Discard patch within 12 hours 12/23/13   Darlyne Russian, MD  losartan (COZAAR) 50 MG tablet Take 50 mg by mouth daily.    [provider]  montelukast (SINGULAIR) 10 MG tablet Take 1 tablet (10 mg total) by mouth at bedtime. PATIENT NEEDS OFFICE VISIT FOR ADDITIONAL REFILLS - 2nd NOTICE 07/14/13   Theda Sers, PA-C  Multiple Vitamin (MULTIVITAMIN) tablet Take 1 tablet by mouth daily.    [provider]  naproxen sodium (ALEVE) 220 MG tablet Take 440 mg by mouth 2 (two) times daily with a meal.    [provider]  oxyCODONE-acetaminophen (PERCOCET/ROXICET) 5-325 MG tablet Take 1-2 tablets by mouth every 4 (four) hours as needed for severe pain. 06/06/17   Kristeen Miss, MD  pantoprazole (PROTONIX) 20 MG tablet Take 20 mg by mouth daily.  06/22/14   [provider]  sucralfate (CARAFATE) 1 GM/10ML suspension Take 1 g by mouth 3 (three) times daily as needed (when taking NSAIDS).    [provider]    Family History Family History  Problem Relation Age of Onset  . Mental illness Sister   . Hypertension Brother   . Heart murmur Brother   . Arrhythmia Maternal Grandmother        Atrial Fibrillation  . COPD Maternal Grandmother    . Cancer Maternal Grandfather   . Alzheimer's disease Paternal Grandmother   . COPD Paternal Grandfather   . Heart failure Paternal Grandfather   . Allergies Son   . Allergies Daughter   . Allergies Daughter   . Diabetes Father   . Hypertension Father   . Heart failure Father   . COPD Father   . Heart attack Father   . Arthritis Father   . Heart murmur Mother   . Hypothyroidism Mother   . Heart disease Mother     Social History Social History   Tobacco Use  . Smoking status: Current Every Day Smoker    Packs/day: 0.50    Years: 30.00    Pack years: 15.00    Start date: 02/19/2012  . Smokeless tobacco: Never Used  Substance Use Topics  . Alcohol use: Yes    Alcohol/week: 0.0 oz    Comment: occassional wine  . Drug use: No     Allergies   Sulfa antibiotics; Azithromycin; Fenofibrate; Tape; Coreg; Fenofibrate; Ibuprofen; Verapamil; Asa [aspirin]; Escitalopram oxalate; and Talwin [pentazocine lactate]   Review of Systems Review of Systems  Ten systems reviewed and are negative for acute change, except as noted in the HPI.   Physical Exam Updated Vital Signs BP (!) 157/82   Pulse (!) 55   Temp 99 F (37.2 C) (Oral)   Resp 16   Ht 5\' 4"  (1.626 m)   Wt 66.2 kg (146 lb)   SpO2 99%   BMI 25.06 kg/m   Physical Exam  Constitutional: She is oriented to person, place, and time. She appears well-developed and well-nourished. No distress.  HENT:  Head: Normocephalic and atraumatic.  Eyes: Conjunctivae are normal. No scleral icterus.  Neck: Normal range of motion.  Cardiovascular: Normal rate, regular rhythm and normal heart sounds. Exam reveals no gallop and no friction rub.  No murmur heard. Pulmonary/Chest: Effort normal and breath sounds normal. No respiratory distress.  Abdominal: Soft. Bowel sounds are normal. She exhibits no distension and no mass. There is no tenderness. There is no guarding.  Musculoskeletal:  Significant bruising along the sacrum and in  the gluteal cleft.  Exquisitely tender over the sacral region  Neurological: She is alert and oriented to person, place, and time.  Skin: Skin is warm and dry. She is not diaphoretic.  Psychiatric: Her behavior is normal.  Nursing note and vitals reviewed.    ED Treatments / Results  Labs (all labs ordered are listed, but only abnormal results are displayed) Labs Reviewed  CBC WITH DIFFERENTIAL/PLATELET - Abnormal; Notable for the following components:      Result Value   Hemoglobin 11.9 (*)    RDW 18.7 (*)    All other components within normal limits  COMPREHENSIVE METABOLIC PANEL - Abnormal; Notable for the following components:   Calcium 8.8 (*)    Total Protein 6.3 (*)    All other components within normal limits  I-STAT CHEM 8, ED - Abnormal; Notable for the following components:   Calcium, Ion 1.13 (*)    All other components within normal limits    EKG None  Radiology Dg Ribs Unilateral W/chest Right  Result Date: 10/18/2017 CLINICAL DATA:  Right lower anterior rib pain after falling off a horse 10 days ago. EXAM: RIGHT RIBS AND CHEST - 3+ VIEW COMPARISON:  Chest x-ray dated June 26, 2011. FINDINGS: No fracture or other bone lesions are seen involving the ribs. Osteolysis of the right distal clavicle versus prior distal clavicle resection. Partially visualized lumbar fusion hardware. There is no evidence of pneumothorax or pleural effusion. Both lungs are clear. Heart size and mediastinal contours are within normal limits. IMPRESSION: Negative. Electronically Signed   By: Titus Dubin M.D.   On: 10/18/2017 17:12   Ct Abdomen Pelvis W Contrast  Result Date: 10/18/2017 CLINICAL DATA:  Initial evaluation for acute lower abdominal and back pain status post fall off horse. EXAM: CT ABDOMEN AND PELVIS WITH CONTRAST TECHNIQUE: Multidetector CT imaging of the abdomen and pelvis was performed using the standard protocol following bolus administration of intravenous contrast.  CONTRAST:  156mL OMNIPAQUE IOHEXOL 300 MG/ML  SOLN COMPARISON:  Prior radiograph from 10/11/2017. FINDINGS: Lower chest: Mild scattered subsegmental atelectatic changes seen dependently within the visualized lung bases. Visualized lungs are otherwise clear. Hepatobiliary: Liver demonstrates a normal contrast enhanced appearance. Focal fat deposition noted adjacent to the falciform ligament. Gallbladder mildly distended without acute inflammatory changes or evidence for acute cholecystitis. No biliary dilatation. Pancreas: Pancreas within normal limits. Spleen: Spleen within normal limits. Adrenals/Urinary Tract: Adrenal glands are normal. Kidneys equal in size with symmetric enhancement. 4 mm nonobstructive stone present within the upper pole left kidney. 5.5 cm right parapelvic cyst noted. No hydronephrosis or focal enhancing renal mass. No appreciable hydroureter. Bladder within normal limits. Stomach/Bowel: Postoperative changes from prior Roux-en-Y gastric bypass noted. No adverse features. No evidence for bowel obstruction. Appendix within normal limits. No acute inflammatory changes seen about the bowels. Vascular/Lymphatic: Normal intravascular enhancement seen throughout the intra-abdominal aorta. Mild aorto bi-iliac atherosclerotic disease. Mesenteric vessels are patent proximally. No adenopathy. Reproductive: Multiple calcified fibroids present within the uterus, largest of which measures 7 cm in size. Ovaries within normal limits. Other: No free air or fluid. Musculoskeletal: Postoperative changes from prior posterior decompression with fusion at L2 through L5 S1. Hardware intact without complication. There is age indeterminate lucency with slight kyphotic angulation extending through the S2/S3 segments (series 7, image 99). Finding could reflect acute nondisplaced fracture. No other acute osseous abnormality identified. SI joints remain approximated. No worrisome lytic or blastic osseous lesions.  IMPRESSION: 1. Lucencies extending through the S2/S3 segments as above, age indeterminate, but could reflect acute nondisplaced fractures. Correlation with physical exam recommended. Follow-up examination with dedicated MRI could be performed for further evaluation as clinically warranted. 2. No other acute traumatic injury within the abdomen and pelvis. 3. 4 mm nonobstructive left renal nephrolithiasis. 4. Sequelae of prior gastric bypass without adverse features. 5. Fibroid uterus. Electronically Signed   By: Jeannine Boga M.D.   On: 10/18/2017 17:00    Procedures Procedures (including critical care time)  Medications Ordered in ED Medications  iohexol (OMNIPAQUE) 300 MG/ML solution 100 mL (100 mLs Intravenous Contrast Given 10/18/17 1622)  HYDROcodone-acetaminophen (NORCO) 10-325 MG per tablet 1 tablet (1 tablet Oral Given 10/18/17 1918)     Initial Impression / Assessment and Plan / ED Course  I have reviewed the triage vital signs and the nursing notes.  Pertinent labs & imaging results that were available during my care of the patient were reviewed by me and considered in my medical decision making (see chart for details).     Patient with sacral fracture.  This is visualized on CT scan.  Patient is currently prescribed medication from Dr. Ellene Route.  She is not under pain contract and was sent here by her PCP for further evaluation.  There is no evidence of cauda equina syndrome.  Does have significant pain and an acute fracture unrelated to her other issues.  I have ordered a 2-day supply of Dilaudid given her baseline pain medication.  I have asked the patient follow-up with her prescribing physician for further assistance with medications.  She appears appropriate for discharge home.  We discussed supportive care and return precautions.  Final Clinical Impressions(s) / ED Diagnoses   Final diagnoses:  Closed fracture of sacrum, unspecified portion of sacrum, initial encounter [facility])     ED Discharge Orders        Ordered    HYDROmorphone (DILAUDID) 4 MG tablet  Every 4 hours PRN,   Status:  Discontinued     10/18/17 2032    HYDROmorphone (DILAUDID) 2 MG tablet  Every 4 hours PRN     10/18/17 2046       Margarita Mail, PA-C 10/18/17 2047    Virgel Manifold, MD 10/22/17 1042

## 2017-10-18 NOTE — Discharge Instructions (Addendum)
Contact a health care provider if: Your pain becomes worse. Your bowel movements cause a great deal of discomfort. You are unable to have a bowel movement. You have uncontrolled urine loss (urinary incontinence). You have a fever.

## 2017-10-18 NOTE — ED Notes (Signed)
Pt being evaluated by NP at triage.

## 2017-10-18 NOTE — ED Provider Notes (Signed)
Patient placed in Quick Look pathway, seen and evaluated   Chief Complaint: abdominal pain  HPI:   Michaela Raymond is a 62 y.o. female who presents to the ED with c/o abdominal pain s/p fall from horse 10/08/17. Patient reports she was ridding the horse when a large [place] jumped out of a bush and spooked the horse. Patient landed on her right side. She reports having to lay there for about 10 minutes before she could get up. Following the injury she was having severe pain in her side and back but was most concerned about her back because she has had surgery and was afraid she had messed up the hardware. Patient made an appointment with her neurosurgeon and they did an x-ray. He called the patient and told her that was no damage to the the hardware. He did not physically see the patient that day. He did prescribe hydrocodone. Patient has been taking the medication but has continued pain in the right rib area and her lower abdomen. Patient reports difficulty having BM's. Patient went to her PCP today and was sent to the ED for further evaluation.   ROS: GI: abdominal pain  MS: right rib pain  Physical Exam:  BP 115/67 (BP Location: Right Arm)   Pulse 69   Temp 98.3 F (36.8 C) (Oral)   Resp 18   Ht 5\' 4"  (1.626 m)   Wt 66.2 kg (146 lb)   SpO2 100%   BMI 25.06 kg/m    Gen: No distress  Neuro: Awake and Alert  Skin: areas of ecchymosis to left buttock, no open wounds noted  Abdomen: soft, + BS, tender with palpation RUQ, LLQ, no guarding or rebound.  M/S: tender with palpation of right anterior ribs    Initiation of care has begun. The patient has been counseled on the process, plan, and necessity for staying for the completion/evaluation, and the remainder of the medical screening examination    Ashley Murrain, NP 10/18/17 0923    Carmin Muskrat, MD 10/19/17 [phone]

## 2018-01-25 ENCOUNTER — Emergency Department (HOSPITAL_BASED_OUTPATIENT_CLINIC_OR_DEPARTMENT_OTHER)
Admission: EM | Admit: 2018-01-25 | Discharge: 2018-01-25 | Disposition: A | Discharge status: Home / Self Care | Payer: BLUE CROSS/BLUE SHIELD | Attending: Emergency Medicine | Admitting: Emergency Medicine

## 2018-01-25 ENCOUNTER — Other Ambulatory Visit: Payer: Self-pay

## 2018-01-25 ENCOUNTER — Emergency Department (HOSPITAL_BASED_OUTPATIENT_CLINIC_OR_DEPARTMENT_OTHER): Payer: BLUE CROSS/BLUE SHIELD

## 2018-01-25 ENCOUNTER — Encounter (HOSPITAL_BASED_OUTPATIENT_CLINIC_OR_DEPARTMENT_OTHER): Payer: Self-pay | Admitting: *Deleted

## 2018-01-25 DIAGNOSIS — Z96653 Presence of artificial knee joint, bilateral: Secondary | ICD-10-CM | POA: Diagnosis not present

## 2018-01-25 DIAGNOSIS — Y929 Unspecified place or not applicable: Secondary | ICD-10-CM | POA: Insufficient documentation

## 2018-01-25 DIAGNOSIS — I10 Essential (primary) hypertension: Secondary | ICD-10-CM | POA: Insufficient documentation

## 2018-01-25 DIAGNOSIS — S4992XA Unspecified injury of left shoulder and upper arm, initial encounter: Secondary | ICD-10-CM | POA: Diagnosis present

## 2018-01-25 DIAGNOSIS — E039 Hypothyroidism, unspecified: Secondary | ICD-10-CM | POA: Diagnosis not present

## 2018-01-25 DIAGNOSIS — Y9352 Activity, horseback riding: Secondary | ICD-10-CM | POA: Insufficient documentation

## 2018-01-25 DIAGNOSIS — Z79899 Other long term (current) drug therapy: Secondary | ICD-10-CM | POA: Insufficient documentation

## 2018-01-25 DIAGNOSIS — Y999 Unspecified external cause status: Secondary | ICD-10-CM | POA: Diagnosis not present

## 2018-01-25 DIAGNOSIS — F17210 Nicotine dependence, cigarettes, uncomplicated: Secondary | ICD-10-CM | POA: Diagnosis not present

## 2018-01-25 MED ORDER — ACETAMINOPHEN 500 MG PO TABS
1000.0000 mg | ORAL_TABLET | Freq: Once | ORAL | Status: AC
  Administered 2018-01-25: 1000 mg via ORAL
  Filled 2018-01-25: qty 2

## 2018-01-25 MED ORDER — METHOCARBAMOL 500 MG PO TABS: 500.0000 mg | ORAL_TABLET | Freq: Two times a day (BID) | ORAL | 0 refills | Status: DC

## 2018-01-25 MED ORDER — HYDROCODONE-ACETAMINOPHEN 5-325 MG PO TABS: 1.0000 | ORAL_TABLET | Freq: Four times a day (QID) | ORAL | 0 refills | Status: DC | PRN

## 2018-01-25 NOTE — ED Notes (Signed)
Ice applied in triage.  

## 2018-01-25 NOTE — ED Provider Notes (Signed)
[facility] EMERGENCY DEPARTMENT Provider Note   CSN: [id number] Arrival date & time: 01/25/18  2111     History   Chief Complaint Chief Complaint  Patient presents with  . Shoulder Injury    HPI Michaela Raymond is a 62 y.o. female.  Michaela Raymond is a 62 y.o. Female with a history of hypertension, hyperlipidemia, fibromyalgia gastric bypass surgery, and multiple lumbar fusions, who presents to the emergency department for evaluation of left shoulder injury.  She reports just prior to arrival she was riding her horse when it was spooked and she got thrown off the horse, she reports she landed on her left shoulder.  She did not hit her head, no loss of consciousness, denies any vision changes, dizziness, nausea or vomiting.  She is not having any pain in her chest, shortness of breath, pain with deep breaths or abdominal pain.  She denies any neck or back pain and reports she did not hit her neck or back during the fall.  No pain at the hips or lower extremities.  She was able to drive herself here, and reports she is really only having pain in the shoulder where she landed, pain is most pronounced over the deltoid area and is worse with palpation and movement.  No numbness, tingling or weakness in the left arm.  No discoloration or obvious deformity noted.  Pt is able to move the arm with discomfort, nothing for pain prior to arrival.  Patient has injured her right rotator cuff in the past but denies any injuries or surgeries to the left shoulder.     Past Medical History:  Diagnosis Date  . Adenoma    of thyroid gland, benign  . Anemia    hx. of with grastric bypass surgery  . Angina November 2011   Normal coronary angiogram; noncardiac symptom  . Anxiety   . Arthritis    osteoarthrits  . Asthma   . Barrett's esophagus   . Bronchitis   . Chronic kidney disease    cyst on right kidney hx of  . Complication of anesthesia    woke up during colonoscopy, twlight  surgery with knee surgery pt. stopped breathing  and they did her under general  . COPD (chronic obstructive pulmonary disease) ([place])    pt. denies  . Dyspnea    only with heavy exercise  . Dysrhythmia   . Fibromyalgia   . GERD (gastroesophageal reflux disease)   . Heart murmur   . Hyperlipidemia   . Hypertension   . Hypothyroidism   . Morbid obesity ([place])    Status post gastric bypass surgery September 2013  . Osteopenia   . Osteoporosis   . Pneumonia   . Sleep apnea    cpap machine x 9 years    Patient Active Problem List   Diagnosis Date Noted  . Lumbar adjacent segment disease with spondylolisthesis 06/04/2017  . Chronic pain syndrome 07/13/2016  . Post laminectomy syndrome 07/13/2016  . Spinal stenosis of lumbar region with neurogenic claudication 07/13/2016  . Tobacco abuse counseling 07/19/2015  . Essential hypertension 04/02/2013  . Hyperlipidemia   . History of Morbid obesity - status post gastric bypass surgery   . Other abnormal glucose 10/25/2011  . Other malaise and fatigue 10/25/2011  . Myalgia and myositis, unspecified 10/25/2011  . Degeneration of lumbar or lumbosacral intervertebral disc 10/25/2011  . Degeneration of cervical intervertebral disc 10/25/2011  . OSA on CPAP 10/25/2011  . GERD (gastroesophageal reflux  disease) 10/25/2011  . IBS (irritable bowel syndrome) 10/25/2011  . COPD (chronic obstructive pulmonary disease) ([place]) 10/25/2011  . Other affections of shoulder region, not elsewhere classified 06/26/2011  . Post herpetic neuralgia 09/16/2003    Past Surgical History:  Procedure Laterality Date  . BACK SURGERY     spinal fusion  . CARDIAC CATHETERIZATION  05/03/2010   No significant CAD in L coronary system, normal LV function  . CARDIOVASCULAR STRESS TEST  04/15/2007   EKG negative for ischemia, no significant ischemia demonstrated.  . COLONOSCOPY    . DILATION AND CURETTAGE OF UTERUS    . ESOPHAGOGASTRODUODENOSCOPY    . JOINT  REPLACEMENT    . KNEE ARTHROPLASTY     bilaterally  . LAPAROSCOPIC ROUX-EN-Y GASTRIC BYPASS WITH UPPER ENDOSCOPY AND REMOVAL OF LAP BAND    . NASAL SINUS SURGERY     1989 and mucous cele removed  . ROTATOR CUFF REPAIR     2001  . THYROIDECTOMY    . TONSILLECTOMY     and adnoids removed  . TRANSTHORACIC ECHOCARDIOGRAM  05/03/2021   EF 55-65%, normal     OB History   None      Home Medications    Prior to Admission medications   Medication Sig Start Date End Date Taking? Authorizing Provider  acetaminophen (TYLENOL 8 HOUR) 650 MG CR tablet Take 1,300 mg by mouth 2 (two) times daily.    [provider]  alosetron (LOTRONEX) 0.5 MG tablet Take 0.5 mg by mouth 2 (two) times daily.     [provider]  ALPRAZolam Duanne Moron) 0.5 MG tablet Take 0.25 mg by mouth at bedtime.    [provider]  amoxicillin (AMOXIL) 500 MG capsule Take 2,000 mg by mouth See admin instructions. Take 1 hour prior to dental procedures    [provider]  Biotin 1000 MCG tablet Take 1,000 mcg by mouth daily.     [provider]  calcium citrate-vitamin D (CITRACAL+D) 315-200 MG-UNIT tablet Take 2 tablets by mouth 2 (two) times daily.     [provider]  Cholecalciferol (D 5000) 5000 units TABS Take 5,000 Units by mouth daily.    [provider]  CLIMARA PRO 0.045-0.015 MG/DAY Place 1 patch onto the skin once a week. Tuesdays 06/29/14   [provider]  cyclobenzaprine (FLEXERIL) 10 MG tablet Take 1 tablet (10 mg total) by mouth 3 (three) times daily as needed for muscle spasms. 09/19/16   Magnus Sinning, MD  cyclobenzaprine (FLEXERIL) 10 MG tablet Take 1 tablet (10 mg total) by mouth 3 (three) times daily as needed for muscle spasms. 06/06/17   Kristeen Miss, MD  fluticasone (FLONASE) 50 MCG/ACT nasal spray Place 2 sprays into both nostrils daily. PATIENT NEEDS OFFICE VISIT FOR ADDITIONAL REFILLS    Collene Leyden, PA-C  gabapentin  (NEURONTIN) 100 MG capsule Take 100 mg by mouth daily. 100mg  each morning, 300mg  each evening     [provider]  gabapentin (NEURONTIN) 300 MG capsule Take 300 mg by mouth at bedtime.    [provider]  HYDROcodone-acetaminophen (NORCO/VICODIN) 5-325 MG tablet Take 1 tablet by mouth every 6 (six) hours as needed. 01/25/18   Jacqlyn Larsen, PA-C  HYDROmorphone (DILAUDID) 2 MG tablet Take 1 tablet (2 mg total) by mouth every 4 (four) hours as needed. 10/18/17   Harris, Vernie Shanks, PA-C  levocetirizine (XYZAL) 5 MG tablet Take 5 mg by mouth at bedtime.  06/15/14   [provider]  levothyroxine (SYNTHROID, LEVOTHROID) 137 MCG tablet Take 137 mcg by mouth daily before breakfast.    [provider]  lidocaine (LIDODERM) 5 % Place 1-2 patches onto the skin daily. Remove & Discard patch within 12 hours. PATIENT NEEDS OFFICE VISIT FOR MORE Patient taking differently: Place 1 patch onto the skin daily. Remove & Discard patch within 12 hours 12/23/13   Darlyne Russian, MD  losartan (COZAAR) 50 MG tablet Take 50 mg by mouth daily.    [provider]  methocarbamol (ROBAXIN) 500 MG tablet Take 1 tablet (500 mg total) by mouth 2 (two) times daily. 01/25/18   Jacqlyn Larsen, PA-C  montelukast (SINGULAIR) 10 MG tablet Take 1 tablet (10 mg total) by mouth at bedtime. PATIENT NEEDS OFFICE VISIT FOR ADDITIONAL REFILLS - 2nd NOTICE 07/14/13   Theda Sers, PA-C  Multiple Vitamin (MULTIVITAMIN) tablet Take 1 tablet by mouth daily.    [provider]  naproxen sodium (ALEVE) 220 MG tablet Take 440 mg by mouth 2 (two) times daily with a meal.    [provider]  oxyCODONE-acetaminophen (PERCOCET/ROXICET) 5-325 MG tablet Take 1-2 tablets by mouth every 4 (four) hours as needed for severe pain. 06/06/17   Kristeen Miss, MD  pantoprazole (PROTONIX) 20 MG tablet Take 20 mg by mouth daily.  06/22/14   [provider]  sucralfate (CARAFATE) 1 GM/10ML suspension  Take 1 g by mouth 3 (three) times daily as needed (when taking NSAIDS).    [provider]    Family History Family History  Problem Relation Age of Onset  . Mental illness Sister   . Hypertension Brother   . Heart murmur Brother   . Arrhythmia Maternal Grandmother        Atrial Fibrillation  . COPD Maternal Grandmother   . Cancer Maternal Grandfather   . Alzheimer's disease Paternal Grandmother   . COPD Paternal Grandfather   . Heart failure Paternal Grandfather   . Allergies Son   . Allergies Daughter   . Allergies Daughter   . Diabetes Father   . Hypertension Father   . Heart failure Father   . COPD Father   . Heart attack Father   . Arthritis Father   . Heart murmur Mother   . Hypothyroidism Mother   . Heart disease Mother     Social History Social History   Tobacco Use  . Smoking status: Current Every Day Smoker    Packs/day: 0.50    Years: 30.00    Pack years: 15.00    Start date: 02/19/2012  . Smokeless tobacco: Never Used  Substance Use Topics  . Alcohol use: Yes    Alcohol/week: 0.0 standard drinks    Comment: occassional wine  . Drug use: No     Allergies   Sulfa antibiotics; Azithromycin; Fenofibrate; Tape; Coreg; Fenofibrate; Ibuprofen; Verapamil; Asa [aspirin]; Escitalopram oxalate; and Talwin [pentazocine lactate]   Review of Systems Review of Systems  Constitutional: Negative for chills and fever.  HENT: Negative.   Eyes: Negative for visual disturbance.  Respiratory: Negative for shortness of breath.   Cardiovascular: Negative for chest pain.  Gastrointestinal: Negative for abdominal pain, nausea and vomiting.  Genitourinary: Negative for flank pain.  Musculoskeletal: Positive for arthralgias. Negative for back pain, joint swelling and neck pain.  Skin: Negative for color change, rash and wound.  Neurological: Negative for dizziness, weakness, light-headedness, numbness and headaches.     Physical Exam Updated Vital Signs BP  (!) 160/87 (BP Location: Right  Arm)   Pulse 64   Temp 99 F (37.2 C) (Oral)   Resp 18   Ht 5\' 3"  (1.6 m)   Wt 64.4 kg   SpO2 100%   BMI 25.15 kg/m   Physical Exam  Constitutional: She is oriented to person, place, and time. She appears well-developed and well-nourished. No distress.  HENT:  Head: Normocephalic and atraumatic.  Scalp without signs of trauma, no palpable hematoma, no step-off, negative battle sign, no evidence of hemotympanum or CSF otorrhea   Eyes: Pupils are equal, round, and reactive to light. EOM are normal. Right eye exhibits no discharge. Left eye exhibits no discharge.  Neck: Normal range of motion. Neck supple.  C-spine nontender to palpation at midline or paraspinally, normal range of motion in all directions.  No ecchymosis, no palpable deformity or crepitus  Cardiovascular: Normal rate, regular rhythm, normal heart sounds and intact distal pulses.  Pulses:      Radial pulses are 2+ on the right side, and 2+ on the left side.       Dorsalis pedis pulses are 2+ on the right side, and 2+ on the left side.       Posterior tibial pulses are 2+ on the right side, and 2+ on the left side.  Pulmonary/Chest: Effort normal and breath sounds normal. No stridor. No respiratory distress. She has no wheezes. She has no rales.  Respirations equal and unlabored, patient able to speak in full sentences, lungs clear to auscultation bilaterally, chest nontender to palpation throughout, equal chest expansion bilaterally  Abdominal: Soft. Bowel sounds are normal. She exhibits no distension and no mass. There is no tenderness. There is no guarding.  Abdomen soft, nondistended, nontender to palpation in all quadrants without guarding or peritoneal signs, no CVA tenderness bilaterally  Musculoskeletal:  T-spine and L-spine nontender to palpation at midline. Patient moves all extremities without difficulty. Tenderness to palpation over the left shoulder, tenderness most pronounced  over the deltoid region, no tenderness over the clavicle, no palpable deformity, no swelling or erythema, no ecchymosis.  Patient is able to move the shoulder through full range of motion although is very uncomfortable.  No pain at the elbow or wrist.  2+ DP pulse, sensation and strength intact. All other joints supple and easily movable, no erythema, swelling or palpable deformity, all compartments soft.   Neurological: She is alert and oriented to person, place, and time. Coordination normal.  Speech is clear, able to follow commands CN III-XII intact Normal strength in upper and lower extremities bilaterally including dorsiflexion and plantar flexion, strong and equal grip strength Sensation normal to light and sharp touch Moves extremities without ataxia, coordination intact  Skin: Skin is warm and dry. She is not diaphoretic.  Psychiatric: She has a normal mood and affect. Her behavior is normal.  Nursing note and vitals reviewed.    ED Treatments / Results  Labs (all labs ordered are listed, but only abnormal results are displayed) Labs Reviewed - No data to display  EKG None  Radiology Dg Shoulder Left  Result Date: 01/25/2018 CLINICAL DATA:  Status post fall off horse, with left shoulder pain and decreased range of motion. Initial encounter. EXAM: LEFT SHOULDER - 2+ VIEW COMPARISON:  None. FINDINGS: There is no evidence of fracture or dislocation. The left humeral head is seated within the glenoid fossa. Mild degenerative change is noted at the left acromioclavicular joint. No significant soft tissue abnormalities are seen. The visualized portions of the left lung are  clear. Postoperative change is noted at the left side of the neck. IMPRESSION: No evidence of fracture or dislocation. Electronically Signed   By: Garald Balding M.D.   On: 01/25/2018 22:19    Procedures Procedures (including critical care time)  Medications Ordered in ED Medications  acetaminophen (TYLENOL)  tablet 1,000 mg (1,000 mg Oral Given 01/25/18 2301)     Initial Impression / Assessment and Plan / ED Course  I have reviewed the triage vital signs and the nursing notes.  Pertinent labs & imaging results that were available during my care of the patient were reviewed by me and considered in my medical decision making (see chart for details).  Patient presents to the ED for evaluation of left shoulder pain after falling off a horse and landing on the left shoulder.  No head injury, no midline spinal tenderness.  C-spine cleared Via Nexus criteria.  Normal neurologic exam.  No shortness of breath, chest pain or abdominal pain.  Lungs clear on auscultation without focal rib tenderness.  There is tenderness to palpation over the left shoulder without obvious deformity, all extremities are neurovascularly intact.  X-ray of the left shoulder shows no evidence of fracture, patient has significant pain with range of motion and may have some rotator cuff inflammation or damage.  Patient provided with sling.  She would like to drive herself home, and so declines pain medication at this time, she does not tolerate NSAIDs due to previous gastric bypass, will treat with hydrocodone and Robaxin.  Patient follow-up with orthopedics if symptoms not improving.  She has been counseled to remove shoulder from sling a couple times a day and do range of motion exercises to prevent frozen shoulder.  Return precautions discussed.  Patient expresses understanding and is in agreement with plan.  Final Clinical Impressions(s) / ED Diagnoses   Final diagnoses:  Injury of left shoulder, initial encounter    ED Discharge Orders         Ordered    HYDROcodone-acetaminophen (NORCO/VICODIN) 5-325 MG tablet  Every 6 hours PRN     01/25/18 2300    methocarbamol (ROBAXIN) 500 MG tablet  2 times daily     01/25/18 2300           Jersie Berlin 01/26/18 2137    Isla Pence, MD 01/26/18 2223

## 2018-01-25 NOTE — ED Notes (Addendum)
Positive fx concern reported by radiology

## 2018-01-25 NOTE — ED Notes (Signed)
PMS intact before and after. Pt tolerated well. All questions answered. 

## 2018-01-25 NOTE — ED Triage Notes (Signed)
Pt was thrown from a horse 1 hour PTA. Reports left shoulder injury. ROM intact. Denies other injuries.

## 2018-01-25 NOTE — Discharge Instructions (Signed)
Your x-ray shows no evidence of fracture or dislocation in the shoulder.  You may have damaged her rotator cuff.  Please use sling for comfort, but take your arm out of the sling and do some light range of motion at least 2-3 times per day to help prevent frozen shoulder.  Use ice for the first 2 days, and ice and heat as needed.  You may use hydrocodone as needed for pain, and Robaxin for muscle stiffness, these medications can cause drowsiness do not take before driving.  If your shoulder pain is not improving please follow-up with Dr. Berenice Primas with orthopedics.

## 2018-03-05 ENCOUNTER — Ambulatory Visit: Payer: Self-pay | Admitting: Orthopedic Surgery

## 2018-03-05 NOTE — Patient Instructions (Addendum)
Michaela Raymond  03/05/2018   Your procedure is scheduled on: 03-07-18     Report to [facility] Main  Entrance     Report to Admitting at 1:15 PM    Call this number if you have problems the morning of surgery [phone]   Remember: Do not eat food or drink liquids :After Midnight. You may have a Clear Liquid Diet from Midnight until 8:15 AM. After 8:15 AM, nothing until after surgery.     CLEAR LIQUID DIET   Foods Allowed                                                                     Foods Excluded  Coffee and tea, regular and decaf                             liquids that you cannot  Plain Jell-O in any flavor                                             see through such as: Fruit ices (not with fruit pulp)                                     milk, soups, orange juice  Iced Popsicles                                    All solid food Carbonated beverages, regular and diet                                    Cranberry, grape and apple juices Sports drinks like Gatorade Lightly seasoned clear broth or consume(fat free) Sugar, honey syrup  Sample Menu Breakfast                                Lunch                                     Supper Cranberry juice                    Beef broth                            Chicken broth Jell-O                                     Grape juice  Apple juice Coffee or tea                        Jell-O                                      Popsicle                                                Coffee or tea                        Coffee or tea  _____________________________________________________________________     BRUSH YOUR TEETH MORNING OF SURGERY AND RINSE YOUR MOUTH OUT, NO CHEWING GUM CANDY OR MINTS.     Take these medicines the morning of surgery with A SIP OF WATER: Gabapentin (Neurontin), Levothyroxine (Synthroid), and Pantoprazole (Protonix)  You may also use your pain  medication as needed. Also, bring and use your nasal spray as needed.                               You may not have any metal on your body including hair pins and              piercings  Do not wear jewelry, make-up, lotions, powders or perfumes, deodorant             Do not wear nail polish.  Do not shave  48 hours prior to surgery.               Do not bring valuables to the hospital. [name].  Contacts, dentures or bridgework may not be worn into surgery.  Leave suitcase in the car. After surgery it may be brought to your room.     Special Instructions: Please bring your mask and tubing for your CPAP machine             Please read over the following fact sheets you were given: _____________________________________________________________________             [facility] - Preparing for Surgery Before surgery, you can play an important role.  Because skin is not sterile, your skin needs to be as free of germs as possible.  You can reduce the number of germs on your skin by washing with CHG (chlorahexidine gluconate) soap before surgery.  CHG is an antiseptic cleaner which kills germs and bonds with the skin to continue killing germs even after washing. Please DO NOT use if you have an allergy to CHG or antibacterial soaps.  If your skin becomes reddened/irritated stop using the CHG and inform your nurse when you arrive at Short Stay. Do not shave (including legs and underarms) for at least 48 hours prior to the first CHG shower.  You may shave your face/neck. Please follow these instructions carefully:  1.  Shower with CHG Soap the night before surgery and the  morning of Surgery.  2.  If you choose to wash your hair, wash your hair first as usual with your  normal  shampoo.  3.  After you shampoo, rinse your hair and body thoroughly to remove the  shampoo.                           4.  Use CHG as you would any other liquid soap.  You  can apply chg directly  to the skin and wash                       Gently with a scrungie or clean washcloth.  5.  Apply the CHG Soap to your body ONLY FROM THE NECK DOWN.   Do not use on face/ open                           Wound or open sores. Avoid contact with eyes, ears mouth and genitals (private parts).                       Wash face,  Genitals (private parts) with your normal soap.             6.  Wash thoroughly, paying special attention to the area where your surgery  will be performed.  7.  Thoroughly rinse your body with warm water from the neck down.  8.  DO NOT shower/wash with your normal soap after using and rinsing off  the CHG Soap.                9.  Pat yourself dry with a clean towel.            10.  Wear clean pajamas.            11.  Place clean sheets on your bed the night of your first shower and do not  sleep with pets. Day of Surgery : Do not apply any lotions/deodorants the morning of surgery.  Please wear clean clothes to the hospital/surgery center.  FAILURE TO FOLLOW THESE INSTRUCTIONS MAY RESULT IN THE CANCELLATION OF YOUR SURGERY PATIENT SIGNATURE_________________________________  NURSE SIGNATURE__________________________________  ________________________________________________________________________   Adam Phenix  An incentive spirometer is a tool that can help keep your lungs clear and active. This tool measures how well you are filling your lungs with each breath. Taking long deep breaths may help reverse or decrease the chance of developing breathing (pulmonary) problems (especially infection) following:  A long period of time when you are unable to move or be active. BEFORE THE PROCEDURE   If the spirometer includes an indicator to show your best effort, your nurse or respiratory therapist will set it to a desired goal.  If possible, sit up straight or lean slightly forward. Try not to slouch.  Hold the incentive spirometer in an  upright position. INSTRUCTIONS FOR USE  1. Sit on the edge of your bed if possible, or sit up as far as you can in bed or on a chair. 2. Hold the incentive spirometer in an upright position. 3. Breathe out normally. 4. Place the mouthpiece in your mouth and seal your lips tightly around it. 5. Breathe in slowly and as deeply as possible, raising the piston or the ball toward the top of the column. 6. Hold your breath for 3-5 seconds or for as long as possible. Allow the piston or ball to fall to the bottom of the column. 7. Remove the mouthpiece from your mouth and breathe out normally. 8. Rest for  a few seconds and repeat Steps 1 through 7 at least 10 times every 1-2 hours when you are awake. Take your time and take a few normal breaths between deep breaths. 9. The spirometer may include an indicator to show your best effort. Use the indicator as a goal to work toward during each repetition. 10. After each set of 10 deep breaths, practice coughing to be sure your lungs are clear. If you have an incision (the cut made at the time of surgery), support your incision when coughing by placing a pillow or rolled up towels firmly against it. Once you are able to get out of bed, walk around indoors and cough well. You may stop using the incentive spirometer when instructed by your caregiver.  RISKS AND COMPLICATIONS  Take your time so you do not get dizzy or light-headed.  If you are in pain, you may need to take or ask for pain medication before doing incentive spirometry. It is harder to take a deep breath if you are having pain. AFTER USE  Rest and breathe slowly and easily.  It can be helpful to keep track of a log of your progress. Your caregiver can provide you with a simple table to help with this. If you are using the spirometer at home, follow these instructions: [place] IF:   You are having difficultly using the spirometer.  You have trouble using the spirometer as often as  instructed.  Your pain medication is not giving enough relief while using the spirometer.  You develop fever of 100.5 F (38.1 C) or higher. SEEK IMMEDIATE MEDICAL CARE IF:   You cough up bloody sputum that had not been present before.  You develop fever of 102 F (38.9 C) or greater.  You develop worsening pain at or near the incision site. MAKE SURE YOU:   Understand these instructions.  Will watch your condition.  Will get help right away if you are not doing well or get worse. Document Released: 10/15/2006 Document Revised: 08/27/2011 Document Reviewed: 12/16/2006 [facility] Patient Information 2014 [place], [place].   ________________________________________________________________________

## 2018-03-05 NOTE — H&P (Signed)
Michaela Raymond is an 62 y.o. female.   Chief Complaint: left shoulder pain HPI: Visit For: Follow up (to discuss left shoulder MRI) Location: left; shoulder Duration: date of onset: (01/25/2018); The patient is 4 weeks, 6 days out from the injury Severity: pain level 5/10 Timing: acute Notes: The patient is currently taking Norro 5 and gabapentin  Past Medical History:  Diagnosis Date  . Adenoma    of thyroid gland, benign  . Anemia    hx. of with grastric bypass surgery  . Angina November 2011   Normal coronary angiogram; noncardiac symptom  . Anxiety   . Arthritis    osteoarthrits  . Asthma   . Barrett's esophagus   . Bronchitis   . Chronic kidney disease    cyst on right kidney hx of  . Complication of anesthesia    woke up during colonoscopy, twlight surgery with knee surgery pt. stopped breathing  and they did her under general  . COPD (chronic obstructive pulmonary disease) ([place])    pt. denies  . Dyspnea    only with heavy exercise  . Dysrhythmia   . Fibromyalgia   . GERD (gastroesophageal reflux disease)   . Heart murmur   . Hyperlipidemia   . Hypertension   . Hypothyroidism   . Morbid obesity ([place])    Status post gastric bypass surgery September 2013  . Osteopenia   . Osteoporosis   . Pneumonia   . Sleep apnea    cpap machine x 9 years    Past Surgical History:  Procedure Laterality Date  . BACK SURGERY     spinal fusion  . CARDIAC CATHETERIZATION  05/03/2010   No significant CAD in L coronary system, normal LV function  . CARDIOVASCULAR STRESS TEST  04/15/2007   EKG negative for ischemia, no significant ischemia demonstrated.  . COLONOSCOPY    . DILATION AND CURETTAGE OF UTERUS    . ESOPHAGOGASTRODUODENOSCOPY    . JOINT REPLACEMENT    . KNEE ARTHROPLASTY     bilaterally  . LAPAROSCOPIC ROUX-EN-Y GASTRIC BYPASS WITH UPPER ENDOSCOPY AND REMOVAL OF LAP BAND    . NASAL SINUS SURGERY     1989 and mucous cele removed  . ROTATOR CUFF REPAIR     2001   . THYROIDECTOMY    . TONSILLECTOMY     and adnoids removed  . TRANSTHORACIC ECHOCARDIOGRAM  05/03/2021   EF 55-65%, normal    Family History  Problem Relation Age of Onset  . Mental illness Sister   . Hypertension Brother   . Heart murmur Brother   . Arrhythmia Maternal Grandmother        Atrial Fibrillation  . COPD Maternal Grandmother   . Cancer Maternal Grandfather   . Alzheimer's disease Paternal Grandmother   . COPD Paternal Grandfather   . Heart failure Paternal Grandfather   . Allergies Son   . Allergies Daughter   . Allergies Daughter   . Diabetes Father   . Hypertension Father   . Heart failure Father   . COPD Father   . Heart attack Father   . Arthritis Father   . Heart murmur Mother   . Hypothyroidism Mother   . Heart disease Mother    Social History:  reports that she has been smoking. She started smoking about 6 years ago. She has a 15.00 pack-year smoking history. She has never used smokeless tobacco. She reports that she drinks alcohol. She reports that she does not use drugs.  Allergies:  Allergies  Allergen Reactions  . Sulfa Antibiotics Anaphylaxis, Hives, Itching and Swelling  . Azithromycin Nausea And Vomiting    Severe Abdominal cramping Other reaction(s): Abdominal Pain Severe abd cramping  . Fenofibrate Nausea Only  . Tape Hives and Itching    Only use paper or cloth tape  . Coreg Other (See Comments)    Patient doesn't remember the reaction  . Ibuprofen Other (See Comments)    Patient doesn't remember reaction  . Verapamil Other (See Comments)    Patient doesn't recall reaction  . Asa [Aspirin] Other (See Comments)    GI upset  . Escitalopram Oxalate Anxiety and Other (See Comments)    Panic attack, also   . Talwin [Pentazocine Lactate] Nausea And Vomiting   Medications: alosetron 0.5 mg tablet ALPRAZolam 0.5 mg tablet [place]ara Pro 0.045 mg-0.015 mg/24 hr transdermal patch clobetasol 0.05 % topical ointment cyclobenzaprine 10 mg  tablet fluticasone propionate 50 mcg/actuation nasal spray,suspension gabapentin 300 mg capsule HYDROcodone 5 mg-acetaminophen 325 mg tablet levocetirizine 5 mg tablet levothyroxine 137 mcg tablet lidocaine 5 % topical patch losartan 50 mg tablet montelukast 10 mg tablet nicotine 21 mg/24 hr daily transdermal patch pantoprazole 20 mg tablet,delayed release valACYclovir 500 mg tablet  Review of Systems  Constitutional: Negative.   HENT: Negative.   Eyes: Negative.   Respiratory: Negative.   Cardiovascular: Negative.   Gastrointestinal: Negative.   Genitourinary: Negative.   Musculoskeletal: Positive for back pain and joint pain.  Skin: Negative.   Neurological: Negative.     There were no vitals taken for this visit. Physical Exam  Constitutional: She is oriented to person, place, and time. She appears well-developed. She appears distressed.  HENT:  Head: Normocephalic.  Eyes: Pupils are equal, round, and reactive to light.  Neck: Normal range of motion.  Cardiovascular: Normal rate.  Respiratory: Effort normal.  GI: Soft.  Musculoskeletal:  Patient is a 62 year old female.  Constitutional: General Appearance: healthy-appearing and NAD.  Psychiatric: Mood and Affect: normal mood and affect.  Cardiovascular System: Arterial Pulses Left: radial normal and brachial normal. Edema Left: none. Varicosities Right: no varicosities. Varicosities Left: no varicosities.  C-Spine/Neck: Active Range of Motion: flexion normal, extension normal, and no pain elicited on motion.  Shoulders: Inspection Left: no misalignment, atrophy, erythema, swelling, or scapular winging. Bony Palpation Left: no tenderness of the sternoclavicular joint, the coracoid process, the acromioclavicular joint, the bicipital groove, or the scapula. Soft Tissue Palpation Left: no tenderness of the infraspinatus, the teres minor, the subacromial bursa, the axilla, the glenohumeral joint region, the pectoralis  major insertion, the sternocleidomastoid, the costochondral junction, the trapezius, the rhomboid, the latissimus dorsi, the serratus, the deltoid, the levator scapulae, or the lateral cuff insertion and tenderness of the supraspinatus and the subdeltoid bursa. Active Range of Motion Left: limited. Special Tests Left: Speed's test negative and Neer's test positive. Stability Left: no laxity, sulcus sign negative, and anterior apprehension test negative. Strength Left: abduction 5/5, adduction 5/5, flexion 5/5, and extension 5/5.  Skin: Left Upper Extremity: normal.  Neurological System: Biceps Reflex Left: normal (2). Brachioradialis Reflex Left: normal (2). Triceps Reflex Left: normal (2). Sensation on the Left: C5 normal, C6 normal, and C7 normal.  Neurological: She is oriented to person, place, and time.  Skin: Skin is warm and dry.    MRI demonstrates significant tearing of the rotator cuff both infraspinatus and supraspinatus with retraction and mild atrophy. There is a Kniffen partial tearing of the subscap. Full-thickness tear and retraction  of the biceps. 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 AC arthrosis.  She not symptomatic on the [facility] joint.   Assessment/Plan Left shoulder RCT  She reported no significant problems prior to this injury.  The rotator cuff is likely traumatic. She could have had a acute on chronic tear previous biceps tendon tear although she did have ecchymosis into the arm.  We discussed living with her symptoms versus repair of the rotator cuff.  Discussed possibility of aggravating her glenohumeral arthrosis which then subsequently require a total shoulder replacement.  Her reviewed her outside x-rays she does have narrowing of the glenohumeral joint.  With the need to be careful not to overtighten the rotator cuff repair in terms of advancement  We discussed the possibility of patch graft.  Do not feel she will require distal clavicle resection.  Biceps  debridement versus tenodesis.  An extensive discussion concerning the pathology relevant anatomy and treatment options. After that discussion we mutually agreed to proceed with repair of the rotator cuff utilizing arthroscopic assistance if possible. The risks and benefits of that procedure were discussed including bleeding, infection, suboptimal range of motion, deep venous thrombosis, pulmonary embolism, anesthetic complications etc. in addition we discussed the postoperative course to include approximately 4 weeks of passive range of motion followed by 4 weeks of active range of motion followed by 4-12 weeks of progressive strengthening exercises. In addition we discussed protective activities to reduce the risk of a reinjury including impingement activities with elbow above the shoulder as well as reaching and repetitive circular motion activities. The hospital stay will either be as a outpatient with a regional block versus overnight depending upon the extent of the procedure and any challenging health issues with a first postoperative visit 2 weeks following the surgery.   She had a lot of stresses in her life recently. We discussed overnight therefore in the hospital. She lives alone. Home with therapy. No history of MRSA. Okay for Ancef.  She is also met her deductible pair would like to get this done before the end of the month.  Plan LEFT shoulder scope, SAD, debridement, mini-open RCR, possible patch graft  Cecilie Kicks., PA-C for Dr. Tonita Cong 03/05/2018, 3:14 PM

## 2018-03-05 NOTE — H&P (View-Only) (Signed)
Michaela Raymond is an 62 y.o. female.   Chief Complaint: left shoulder pain HPI: Visit For: Follow up (to discuss left shoulder MRI) Location: left; shoulder Duration: date of onset: (01/25/2018); The patient is 4 weeks, 6 days out from the injury Severity: pain level 5/10 Timing: acute Notes: The patient is currently taking Norro 5 and gabapentin  Past Medical History:  Diagnosis Date  . Adenoma    of thyroid gland, benign  . Anemia    hx. of with grastric bypass surgery  . Angina November 2011   Normal coronary angiogram; noncardiac symptom  . Anxiety   . Arthritis    osteoarthrits  . Asthma   . Barrett's esophagus   . Bronchitis   . Chronic kidney disease    cyst on right kidney hx of  . Complication of anesthesia    woke up during colonoscopy, twlight surgery with knee surgery pt. stopped breathing  and they did her under general  . COPD (chronic obstructive pulmonary disease) ([place])    pt. denies  . Dyspnea    only with heavy exercise  . Dysrhythmia   . Fibromyalgia   . GERD (gastroesophageal reflux disease)   . Heart murmur   . Hyperlipidemia   . Hypertension   . Hypothyroidism   . Morbid obesity ([place])    Status post gastric bypass surgery September 2013  . Osteopenia   . Osteoporosis   . Pneumonia   . Sleep apnea    cpap machine x 9 years    Past Surgical History:  Procedure Laterality Date  . BACK SURGERY     spinal fusion  . CARDIAC CATHETERIZATION  05/03/2010   No significant CAD in L coronary system, normal LV function  . CARDIOVASCULAR STRESS TEST  04/15/2007   EKG negative for ischemia, no significant ischemia demonstrated.  . COLONOSCOPY    . DILATION AND CURETTAGE OF UTERUS    . ESOPHAGOGASTRODUODENOSCOPY    . JOINT REPLACEMENT    . KNEE ARTHROPLASTY     bilaterally  . LAPAROSCOPIC ROUX-EN-Y GASTRIC BYPASS WITH UPPER ENDOSCOPY AND REMOVAL OF LAP BAND    . NASAL SINUS SURGERY     1989 and mucous cele removed  . ROTATOR CUFF REPAIR     2001   . THYROIDECTOMY    . TONSILLECTOMY     and adnoids removed  . TRANSTHORACIC ECHOCARDIOGRAM  05/03/2021   EF 55-65%, normal    Family History  Problem Relation Age of Onset  . Mental illness Sister   . Hypertension Brother   . Heart murmur Brother   . Arrhythmia Maternal Grandmother        Atrial Fibrillation  . COPD Maternal Grandmother   . Cancer Maternal Grandfather   . Alzheimer's disease Paternal Grandmother   . COPD Paternal Grandfather   . Heart failure Paternal Grandfather   . Allergies Son   . Allergies Daughter   . Allergies Daughter   . Diabetes Father   . Hypertension Father   . Heart failure Father   . COPD Father   . Heart attack Father   . Arthritis Father   . Heart murmur Mother   . Hypothyroidism Mother   . Heart disease Mother    Social History:  reports that she has been smoking. She started smoking about 6 years ago. She has a 15.00 pack-year smoking history. She has never used smokeless tobacco. She reports that she drinks alcohol. She reports that she does not use drugs.  Allergies:  Allergies  Allergen Reactions  . Sulfa Antibiotics Anaphylaxis, Hives, Itching and Swelling  . Azithromycin Nausea And Vomiting    Severe Abdominal cramping Other reaction(s): Abdominal Pain Severe abd cramping  . Fenofibrate Nausea Only  . Tape Hives and Itching    Only use paper or cloth tape  . Coreg Other (See Comments)    Patient doesn't remember the reaction  . Ibuprofen Other (See Comments)    Patient doesn't remember reaction  . Verapamil Other (See Comments)    Patient doesn't recall reaction  . Asa [Aspirin] Other (See Comments)    GI upset  . Escitalopram Oxalate Anxiety and Other (See Comments)    Panic attack, also   . Talwin [Pentazocine Lactate] Nausea And Vomiting   Medications: alosetron 0.5 mg tablet ALPRAZolam 0.5 mg tablet Climara Pro 0.045 mg-0.015 mg/24 hr transdermal patch clobetasol 0.05 % topical ointment cyclobenzaprine 10 mg  tablet fluticasone propionate 50 mcg/actuation nasal spray,suspension gabapentin 300 mg capsule HYDROcodone 5 mg-acetaminophen 325 mg tablet levocetirizine 5 mg tablet levothyroxine 137 mcg tablet lidocaine 5 % topical patch losartan 50 mg tablet montelukast 10 mg tablet nicotine 21 mg/24 hr daily transdermal patch pantoprazole 20 mg tablet,delayed release valACYclovir 500 mg tablet  Review of Systems  Constitutional: Negative.   HENT: Negative.   Eyes: Negative.   Respiratory: Negative.   Cardiovascular: Negative.   Gastrointestinal: Negative.   Genitourinary: Negative.   Musculoskeletal: Positive for back pain and joint pain.  Skin: Negative.   Neurological: Negative.     There were no vitals taken for this visit. Physical Exam  Constitutional: She is oriented to person, place, and time. She appears well-developed. She appears distressed.  HENT:  Head: Normocephalic.  Eyes: Pupils are equal, round, and reactive to light.  Neck: Normal range of motion.  Cardiovascular: Normal rate.  Respiratory: Effort normal.  GI: Soft.  Musculoskeletal:  Patient is a 62 year old female.  Constitutional: General Appearance: healthy-appearing and NAD.  Psychiatric: Mood and Affect: normal mood and affect.  Cardiovascular System: Arterial Pulses Left: radial normal and brachial normal. Edema Left: none. Varicosities Right: no varicosities. Varicosities Left: no varicosities.  C-Spine/Neck: Active Range of Motion: flexion normal, extension normal, and no pain elicited on motion.  Shoulders: Inspection Left: no misalignment, atrophy, erythema, swelling, or scapular winging. Bony Palpation Left: no tenderness of the sternoclavicular joint, the coracoid process, the acromioclavicular joint, the bicipital groove, or the scapula. Soft Tissue Palpation Left: no tenderness of the infraspinatus, the teres minor, the subacromial bursa, the axilla, the glenohumeral joint region, the pectoralis  major insertion, the sternocleidomastoid, the costochondral junction, the trapezius, the rhomboid, the latissimus dorsi, the serratus, the deltoid, the levator scapulae, or the lateral cuff insertion and tenderness of the supraspinatus and the subdeltoid bursa. Active Range of Motion Left: limited. Special Tests Left: Speed's test negative and Neer's test positive. Stability Left: no laxity, sulcus sign negative, and anterior apprehension test negative. Strength Left: abduction 5/5, adduction 5/5, flexion 5/5, and extension 5/5.  Skin: Left Upper Extremity: normal.  Neurological System: Biceps Reflex Left: normal (2). Brachioradialis Reflex Left: normal (2). Triceps Reflex Left: normal (2). Sensation on the Left: C5 normal, C6 normal, and C7 normal.  Neurological: She is oriented to person, place, and time.  Skin: Skin is warm and dry.    MRI demonstrates significant tearing of the rotator cuff both infraspinatus and supraspinatus with retraction and mild atrophy. There is a Kniffen partial tearing of the subscap. Full-thickness tear and retraction  of the biceps. 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 AC arthrosis.  She not symptomatic on the [facility] joint.   Assessment/Plan Left shoulder RCT  She reported no significant problems prior to this injury.  The rotator cuff is likely traumatic. She could have had a acute on chronic tear previous biceps tendon tear although she did have ecchymosis into the arm.  We discussed living with her symptoms versus repair of the rotator cuff.  Discussed possibility of aggravating her glenohumeral arthrosis which then subsequently require a total shoulder replacement.  Her reviewed her outside x-rays she does have narrowing of the glenohumeral joint.  With the need to be careful not to overtighten the rotator cuff repair in terms of advancement  We discussed the possibility of patch graft.  Do not feel she will require distal clavicle resection.  Biceps  debridement versus tenodesis.  An extensive discussion concerning the pathology relevant anatomy and treatment options. After that discussion we mutually agreed to proceed with repair of the rotator cuff utilizing arthroscopic assistance if possible. The risks and benefits of that procedure were discussed including bleeding, infection, suboptimal range of motion, deep venous thrombosis, pulmonary embolism, anesthetic complications etc. in addition we discussed the postoperative course to include approximately 4 weeks of passive range of motion followed by 4 weeks of active range of motion followed by 4-12 weeks of progressive strengthening exercises. In addition we discussed protective activities to reduce the risk of a reinjury including impingement activities with elbow above the shoulder as well as reaching and repetitive circular motion activities. The hospital stay will either be as a outpatient with a regional block versus overnight depending upon the extent of the procedure and any challenging health issues with a first postoperative visit 2 weeks following the surgery.   She had a lot of stresses in her life recently. We discussed overnight therefore in the hospital. She lives alone. Home with therapy. No history of MRSA. Okay for Ancef.  She is also met her deductible pair would like to get this done before the end of the month.  Plan LEFT shoulder scope, SAD, debridement, mini-open RCR, possible patch graft  Cecilie Kicks., PA-C for Dr. Tonita Cong 03/05/2018, 3:14 PM

## 2018-03-06 ENCOUNTER — Encounter (HOSPITAL_COMMUNITY)
Admission: RE | Admit: 2018-03-06 | Discharge: 2018-03-06 | Disposition: A | Discharge status: Home / Self Care | Payer: BLUE CROSS/BLUE SHIELD | Source: Ambulatory Visit | Attending: Specialist | Admitting: Specialist

## 2018-03-06 ENCOUNTER — Encounter (HOSPITAL_COMMUNITY): Payer: Self-pay

## 2018-03-06 ENCOUNTER — Other Ambulatory Visit: Payer: Self-pay

## 2018-03-06 DIAGNOSIS — Z01812 Encounter for preprocedural laboratory examination: Secondary | ICD-10-CM | POA: Diagnosis present

## 2018-03-06 DIAGNOSIS — R9431 Abnormal electrocardiogram [ECG] [EKG]: Secondary | ICD-10-CM | POA: Diagnosis not present

## 2018-03-06 DIAGNOSIS — I4510 Unspecified right bundle-branch block: Secondary | ICD-10-CM | POA: Insufficient documentation

## 2018-03-06 DIAGNOSIS — Z01810 Encounter for preprocedural cardiovascular examination: Secondary | ICD-10-CM | POA: Insufficient documentation

## 2018-03-06 DIAGNOSIS — I10 Essential (primary) hypertension: Secondary | ICD-10-CM | POA: Insufficient documentation

## 2018-03-06 DIAGNOSIS — M75102 Unspecified rotator cuff tear or rupture of left shoulder, not specified as traumatic: Secondary | ICD-10-CM | POA: Insufficient documentation

## 2018-03-06 LAB — BASIC METABOLIC PANEL
ANION GAP: 10 (ref 5–15)
BUN: 15 mg/dL (ref 8–23)
CALCIUM: 9 mg/dL (ref 8.9–10.3)
CO2: 26 mmol/L (ref 22–32)
Chloride: 103 mmol/L (ref 98–111)
Creatinine, Ser: 0.74 mg/dL (ref 0.44–1.00)
Glucose, Bld: 109 mg/dL — ABNORMAL HIGH (ref 70–99)
Potassium: 4.3 mmol/L (ref 3.5–5.1)
Sodium: 139 mmol/L (ref 135–145)

## 2018-03-06 LAB — CBC
HCT: 39.1 % (ref 36.0–46.0)
HEMOGLOBIN: 12.6 g/dL (ref 12.0–15.0)
MCH: 29.9 pg (ref 26.0–34.0)
MCHC: 32.2 g/dL (ref 30.0–36.0)
MCV: 92.9 fL (ref 78.0–100.0)
Platelets: 402 10*3/uL — ABNORMAL HIGH (ref 150–400)
RBC: 4.21 MIL/uL (ref 3.87–5.11)
RDW: 15.6 % — ABNORMAL HIGH (ref 11.5–15.5)
WBC: 7.2 10*3/uL (ref 4.0–10.5)

## 2018-03-07 ENCOUNTER — Other Ambulatory Visit: Payer: Self-pay

## 2018-03-07 ENCOUNTER — Ambulatory Visit (HOSPITAL_COMMUNITY): Payer: BLUE CROSS/BLUE SHIELD | Admitting: Anesthesiology

## 2018-03-07 ENCOUNTER — Encounter (HOSPITAL_COMMUNITY): Payer: Self-pay

## 2018-03-07 ENCOUNTER — Encounter (HOSPITAL_COMMUNITY)
Admission: RE | Disposition: A | Discharge status: Home / Self Care | Payer: Self-pay | Source: Ambulatory Visit | Attending: Specialist

## 2018-03-07 ENCOUNTER — Ambulatory Visit (HOSPITAL_COMMUNITY)
Admission: RE | Admit: 2018-03-07 | Discharge: 2018-03-08 | Disposition: A | Discharge status: Home / Self Care | Payer: BLUE CROSS/BLUE SHIELD | Source: Ambulatory Visit | Attending: Specialist | Admitting: Specialist

## 2018-03-07 DIAGNOSIS — G8929 Other chronic pain: Secondary | ICD-10-CM | POA: Diagnosis not present

## 2018-03-07 DIAGNOSIS — Z981 Arthrodesis status: Secondary | ICD-10-CM | POA: Insufficient documentation

## 2018-03-07 DIAGNOSIS — X58XXXA Exposure to other specified factors, initial encounter: Secondary | ICD-10-CM | POA: Insufficient documentation

## 2018-03-07 DIAGNOSIS — Z7951 Long term (current) use of inhaled steroids: Secondary | ICD-10-CM | POA: Insufficient documentation

## 2018-03-07 DIAGNOSIS — Z23 Encounter for immunization: Secondary | ICD-10-CM | POA: Diagnosis not present

## 2018-03-07 DIAGNOSIS — M549 Dorsalgia, unspecified: Secondary | ICD-10-CM | POA: Diagnosis not present

## 2018-03-07 DIAGNOSIS — Z79891 Long term (current) use of opiate analgesic: Secondary | ICD-10-CM | POA: Diagnosis not present

## 2018-03-07 DIAGNOSIS — E039 Hypothyroidism, unspecified: Secondary | ICD-10-CM | POA: Insufficient documentation

## 2018-03-07 DIAGNOSIS — Z79890 Hormone replacement therapy (postmenopausal): Secondary | ICD-10-CM | POA: Insufficient documentation

## 2018-03-07 DIAGNOSIS — Z882 Allergy status to sulfonamides status: Secondary | ICD-10-CM | POA: Diagnosis not present

## 2018-03-07 DIAGNOSIS — Z96653 Presence of artificial knee joint, bilateral: Secondary | ICD-10-CM | POA: Diagnosis not present

## 2018-03-07 DIAGNOSIS — Z888 Allergy status to other drugs, medicaments and biological substances status: Secondary | ICD-10-CM | POA: Insufficient documentation

## 2018-03-07 DIAGNOSIS — Z886 Allergy status to analgesic agent status: Secondary | ICD-10-CM | POA: Diagnosis not present

## 2018-03-07 DIAGNOSIS — K219 Gastro-esophageal reflux disease without esophagitis: Secondary | ICD-10-CM | POA: Insufficient documentation

## 2018-03-07 DIAGNOSIS — J449 Chronic obstructive pulmonary disease, unspecified: Secondary | ICD-10-CM | POA: Diagnosis not present

## 2018-03-07 DIAGNOSIS — S46012A Strain of muscle(s) and tendon(s) of the rotator cuff of left shoulder, initial encounter: Secondary | ICD-10-CM | POA: Diagnosis not present

## 2018-03-07 DIAGNOSIS — G4730 Sleep apnea, unspecified: Secondary | ICD-10-CM | POA: Diagnosis not present

## 2018-03-07 DIAGNOSIS — Z79899 Other long term (current) drug therapy: Secondary | ICD-10-CM | POA: Insufficient documentation

## 2018-03-07 DIAGNOSIS — I129 Hypertensive chronic kidney disease with stage 1 through stage 4 chronic kidney disease, or unspecified chronic kidney disease: Secondary | ICD-10-CM | POA: Diagnosis not present

## 2018-03-07 DIAGNOSIS — M797 Fibromyalgia: Secondary | ICD-10-CM | POA: Insufficient documentation

## 2018-03-07 DIAGNOSIS — M75120 Complete rotator cuff tear or rupture of unspecified shoulder, not specified as traumatic: Secondary | ICD-10-CM | POA: Diagnosis present

## 2018-03-07 DIAGNOSIS — Z9884 Bariatric surgery status: Secondary | ICD-10-CM | POA: Diagnosis not present

## 2018-03-07 DIAGNOSIS — N189 Chronic kidney disease, unspecified: Secondary | ICD-10-CM | POA: Diagnosis not present

## 2018-03-07 DIAGNOSIS — M19012 Primary osteoarthritis, left shoulder: Secondary | ICD-10-CM | POA: Diagnosis not present

## 2018-03-07 DIAGNOSIS — S46112A Strain of muscle, fascia and tendon of long head of biceps, left arm, initial encounter: Secondary | ICD-10-CM | POA: Insufficient documentation

## 2018-03-07 DIAGNOSIS — F419 Anxiety disorder, unspecified: Secondary | ICD-10-CM | POA: Diagnosis not present

## 2018-03-07 DIAGNOSIS — F17210 Nicotine dependence, cigarettes, uncomplicated: Secondary | ICD-10-CM | POA: Insufficient documentation

## 2018-03-07 HISTORY — PX: SHOULDER ARTHROSCOPY WITH OPEN ROTATOR CUFF REPAIR: SHX6092

## 2018-03-07 SURGERY — ARTHROSCOPY, SHOULDER WITH REPAIR, ROTATOR CUFF, OPEN
Anesthesia: General | Laterality: Left

## 2018-03-07 MED ORDER — ALUM & MAG HYDROXIDE-SIMETH 200-200-20 MG/5ML PO SUSP: 30.0000 mL | ORAL | Status: DC | PRN

## 2018-03-07 MED ORDER — LIDOCAINE 5 % EX PTCH
1.0000 | MEDICATED_PATCH | CUTANEOUS | Status: DC
  Filled 2018-03-07: qty 1

## 2018-03-07 MED ORDER — OXYCODONE HCL 5 MG PO TABS: 5.0000 mg | ORAL_TABLET | ORAL | 0 refills | Status: AC | PRN

## 2018-03-07 MED ORDER — MONTELUKAST SODIUM 10 MG PO TABS
10.0000 mg | ORAL_TABLET | Freq: Every day | ORAL | Status: DC
  Administered 2018-03-07: 10 mg via ORAL
  Filled 2018-03-07: qty 1

## 2018-03-07 MED ORDER — VITAMIN D 1000 UNITS PO TABS: 5000.0000 [IU] | ORAL_TABLET | Freq: Every day | ORAL | Status: DC

## 2018-03-07 MED ORDER — CALCIUM CITRATE-VITAMIN D 315-200 MG-UNIT PO TABS: 2.0000 | ORAL_TABLET | Freq: Two times a day (BID) | ORAL | Status: DC

## 2018-03-07 MED ORDER — SUCRALFATE 1 GM/10ML PO SUSP: 1.0000 g | Freq: Three times a day (TID) | ORAL | Status: DC | PRN

## 2018-03-07 MED ORDER — ACETAMINOPHEN 325 MG PO TABS: 325.0000 mg | ORAL_TABLET | Freq: Four times a day (QID) | ORAL | Status: DC | PRN

## 2018-03-07 MED ORDER — BISACODYL 5 MG PO TBEC: 5.0000 mg | DELAYED_RELEASE_TABLET | Freq: Every day | ORAL | Status: DC | PRN

## 2018-03-07 MED ORDER — ROPIVACAINE HCL 5 MG/ML IJ SOLN
INTRAMUSCULAR | Status: DC | PRN
  Administered 2018-03-07: 25 mL via PERINEURAL

## 2018-03-07 MED ORDER — GABAPENTIN 100 MG PO CAPS
100.0000 mg | ORAL_CAPSULE | Freq: Every day | ORAL | Status: DC
  Administered 2018-03-08: 100 mg via ORAL
  Filled 2018-03-07: qty 1

## 2018-03-07 MED ORDER — MAGNESIUM CITRATE PO SOLN: 1.0000 | Freq: Once | ORAL | Status: DC | PRN

## 2018-03-07 MED ORDER — METOCLOPRAMIDE HCL 5 MG PO TABS: 5.0000 mg | ORAL_TABLET | Freq: Three times a day (TID) | ORAL | Status: DC | PRN

## 2018-03-07 MED ORDER — ACETAMINOPHEN ER 650 MG PO TBCR: 1300.0000 mg | EXTENDED_RELEASE_TABLET | Freq: Two times a day (BID) | ORAL | Status: DC

## 2018-03-07 MED ORDER — METHOCARBAMOL 500 MG PO TABS
500.0000 mg | ORAL_TABLET | Freq: Four times a day (QID) | ORAL | Status: DC | PRN
  Administered 2018-03-08: 500 mg via ORAL
  Filled 2018-03-07: qty 1

## 2018-03-07 MED ORDER — MENTHOL 3 MG MT LOZG: 1.0000 | LOZENGE | OROMUCOSAL | Status: DC | PRN

## 2018-03-07 MED ORDER — ENOXAPARIN SODIUM 30 MG/0.3ML ~~LOC~~ SOLN
30.0000 mg | SUBCUTANEOUS | Status: DC
  Administered 2018-03-08: 30 mg via SUBCUTANEOUS
  Filled 2018-03-07: qty 0.3

## 2018-03-07 MED ORDER — FENTANYL CITRATE (PF) 100 MCG/2ML IJ SOLN
INTRAMUSCULAR | Status: AC
  Filled 2018-03-07: qty 2

## 2018-03-07 MED ORDER — CALCIUM CARBONATE-VITAMIN D 500-200 MG-UNIT PO TABS
2.0000 | ORAL_TABLET | Freq: Two times a day (BID) | ORAL | Status: DC
  Administered 2018-03-07 – 2018-03-08 (×2): 2 via ORAL
  Filled 2018-03-07 (×2): qty 2

## 2018-03-07 MED ORDER — DIPHENHYDRAMINE HCL 12.5 MG/5ML PO ELIX
12.5000 mg | ORAL_SOLUTION | ORAL | Status: DC | PRN
  Administered 2018-03-08: 25 mg via ORAL
  Filled 2018-03-07: qty 10

## 2018-03-07 MED ORDER — ESTRADIOL-LEVONORGESTREL 0.045-0.015 MG/DAY TD PTWK: 1.0000 | MEDICATED_PATCH | TRANSDERMAL | Status: DC

## 2018-03-07 MED ORDER — POLYETHYLENE GLYCOL 3350 17 G PO PACK: 17.0000 g | PACK | Freq: Every day | ORAL | Status: DC | PRN

## 2018-03-07 MED ORDER — INFLUENZA VAC SPLIT QUAD 0.5 ML IM SUSY
0.5000 mL | PREFILLED_SYRINGE | INTRAMUSCULAR | Status: AC
  Administered 2018-03-08: 0.5 mL via INTRAMUSCULAR
  Filled 2018-03-07: qty 0.5

## 2018-03-07 MED ORDER — ACETAMINOPHEN 500 MG PO TABS
1000.0000 mg | ORAL_TABLET | Freq: Four times a day (QID) | ORAL | Status: DC
  Administered 2018-03-07 – 2018-03-08 (×2): 1000 mg via ORAL
  Filled 2018-03-07 (×2): qty 2

## 2018-03-07 MED ORDER — LACTATED RINGERS IV SOLN: INTRAVENOUS | Status: DC

## 2018-03-07 MED ORDER — CEFAZOLIN SODIUM-DEXTROSE 2-4 GM/100ML-% IV SOLN
2.0000 g | INTRAVENOUS | Status: AC
  Administered 2018-03-07: 2 g via INTRAVENOUS
  Filled 2018-03-07: qty 100

## 2018-03-07 MED ORDER — EPHEDRINE SULFATE-NACL 50-0.9 MG/10ML-% IV SOSY
PREFILLED_SYRINGE | INTRAVENOUS | Status: DC | PRN
  Administered 2018-03-07 (×2): 5 mg via INTRAVENOUS
  Administered 2018-03-07: 15 mg via INTRAVENOUS

## 2018-03-07 MED ORDER — SUGAMMADEX SODIUM 200 MG/2ML IV SOLN
INTRAVENOUS | Status: DC | PRN
  Administered 2018-03-07: 150 mg via INTRAVENOUS

## 2018-03-07 MED ORDER — DEXAMETHASONE SODIUM PHOSPHATE 10 MG/ML IJ SOLN
INTRAMUSCULAR | Status: AC
  Filled 2018-03-07: qty 1

## 2018-03-07 MED ORDER — ROCURONIUM BROMIDE 10 MG/ML (PF) SYRINGE
PREFILLED_SYRINGE | INTRAVENOUS | Status: DC | PRN
  Administered 2018-03-07: 50 mg via INTRAVENOUS

## 2018-03-07 MED ORDER — LACTATED RINGERS IR SOLN
Status: DC | PRN
  Administered 2018-03-07: 6000 mL

## 2018-03-07 MED ORDER — LORATADINE 10 MG PO TABS: 10.0000 mg | ORAL_TABLET | Freq: Every day | ORAL | Status: DC

## 2018-03-07 MED ORDER — ONDANSETRON HCL 4 MG/2ML IJ SOLN: 4.0000 mg | Freq: Four times a day (QID) | INTRAMUSCULAR | Status: DC | PRN

## 2018-03-07 MED ORDER — PHENOL 1.4 % MT LIQD: 1.0000 | OROMUCOSAL | Status: DC | PRN

## 2018-03-07 MED ORDER — OXYCODONE HCL 5 MG PO TABS: 10.0000 mg | ORAL_TABLET | ORAL | Status: DC | PRN

## 2018-03-07 MED ORDER — LEVOCETIRIZINE DIHYDROCHLORIDE 5 MG PO TABS: 5.0000 mg | ORAL_TABLET | Freq: Every day | ORAL | Status: DC

## 2018-03-07 MED ORDER — LOSARTAN POTASSIUM 50 MG PO TABS
50.0000 mg | ORAL_TABLET | Freq: Every day | ORAL | Status: DC
  Administered 2018-03-08: 50 mg via ORAL
  Filled 2018-03-07: qty 1

## 2018-03-07 MED ORDER — BUPIVACAINE-EPINEPHRINE (PF) 0.5% -1:200000 IJ SOLN
INTRAMUSCULAR | Status: AC
  Filled 2018-03-07: qty 30

## 2018-03-07 MED ORDER — LACTATED RINGERS IV SOLN
INTRAVENOUS | Status: DC
  Administered 2018-03-07: 1000 mL via INTRAVENOUS
  Administered 2018-03-07: 16:00:00 via INTRAVENOUS

## 2018-03-07 MED ORDER — SODIUM CHLORIDE 0.9 % IV SOLN
INTRAVENOUS | Status: DC | PRN
  Administered 2018-03-07: 500 mL

## 2018-03-07 MED ORDER — FENTANYL CITRATE (PF) 100 MCG/2ML IJ SOLN
INTRAMUSCULAR | Status: DC | PRN
  Administered 2018-03-07: 100 ug via INTRAVENOUS

## 2018-03-07 MED ORDER — ONDANSETRON HCL 4 MG/2ML IJ SOLN
INTRAMUSCULAR | Status: AC
  Filled 2018-03-07: qty 2

## 2018-03-07 MED ORDER — LEVOTHYROXINE SODIUM 25 MCG PO TABS
137.0000 ug | ORAL_TABLET | Freq: Every day | ORAL | Status: DC
  Administered 2018-03-08: 137 ug via ORAL
  Filled 2018-03-07: qty 1

## 2018-03-07 MED ORDER — FLUTICASONE PROPIONATE 50 MCG/ACT NA SUSP
2.0000 | Freq: Every day | NASAL | Status: DC
  Administered 2018-03-08: 2 via NASAL
  Filled 2018-03-07: qty 16

## 2018-03-07 MED ORDER — FENTANYL CITRATE (PF) 100 MCG/2ML IJ SOLN
50.0000 ug | INTRAMUSCULAR | Status: DC | PRN
  Administered 2018-03-07: 100 ug via INTRAVENOUS
  Filled 2018-03-07: qty 2

## 2018-03-07 MED ORDER — DOCUSATE SODIUM 100 MG PO CAPS
100.0000 mg | ORAL_CAPSULE | Freq: Two times a day (BID) | ORAL | Status: DC
  Administered 2018-03-07 – 2018-03-08 (×2): 100 mg via ORAL
  Filled 2018-03-07 (×2): qty 1

## 2018-03-07 MED ORDER — CEFAZOLIN SODIUM-DEXTROSE 2-4 GM/100ML-% IV SOLN
2.0000 g | Freq: Four times a day (QID) | INTRAVENOUS | Status: AC
  Administered 2018-03-07 – 2018-03-08 (×2): 2 g via INTRAVENOUS
  Filled 2018-03-07 (×2): qty 100

## 2018-03-07 MED ORDER — BUPIVACAINE-EPINEPHRINE 0.5% -1:200000 IJ SOLN
INTRAMUSCULAR | Status: DC | PRN
  Administered 2018-03-07: 20 mL

## 2018-03-07 MED ORDER — EPINEPHRINE PF 1 MG/ML IJ SOLN
INTRAMUSCULAR | Status: DC | PRN
  Administered 2018-03-07: 2 mg

## 2018-03-07 MED ORDER — PROPOFOL 10 MG/ML IV BOLUS
INTRAVENOUS | Status: DC | PRN
  Administered 2018-03-07: 140 mg via INTRAVENOUS

## 2018-03-07 MED ORDER — BIOTIN 5 MG PO CAPS: 5.0000 mg | ORAL_CAPSULE | Freq: Every day | ORAL | Status: DC

## 2018-03-07 MED ORDER — EPINEPHRINE PF 1 MG/ML IJ SOLN
INTRAMUSCULAR | Status: AC
  Filled 2018-03-07: qty 2

## 2018-03-07 MED ORDER — CHLORHEXIDINE GLUCONATE 4 % EX LIQD: 60.0000 mL | Freq: Once | CUTANEOUS | Status: DC

## 2018-03-07 MED ORDER — METHOCARBAMOL 500 MG PO TABS: 500.0000 mg | ORAL_TABLET | Freq: Four times a day (QID) | ORAL | 1 refills | Status: DC | PRN

## 2018-03-07 MED ORDER — METOCLOPRAMIDE HCL 5 MG/ML IJ SOLN: 5.0000 mg | Freq: Three times a day (TID) | INTRAMUSCULAR | Status: DC | PRN

## 2018-03-07 MED ORDER — DOCUSATE SODIUM 100 MG PO CAPS: 100.0000 mg | ORAL_CAPSULE | Freq: Two times a day (BID) | ORAL | 0 refills | Status: DC | PRN

## 2018-03-07 MED ORDER — KCL IN DEXTROSE-NACL 20-5-0.45 MEQ/L-%-% IV SOLN
INTRAVENOUS | Status: DC
  Administered 2018-03-07: 21:00:00 via INTRAVENOUS
  Filled 2018-03-07 (×2): qty 1000

## 2018-03-07 MED ORDER — OXYCODONE HCL 5 MG PO TABS: 5.0000 mg | ORAL_TABLET | ORAL | Status: DC | PRN

## 2018-03-07 MED ORDER — METHOCARBAMOL 500 MG IVPB - SIMPLE MED
500.0000 mg | Freq: Four times a day (QID) | INTRAVENOUS | Status: DC | PRN
  Filled 2018-03-07: qty 50

## 2018-03-07 MED ORDER — GABAPENTIN 300 MG PO CAPS
300.0000 mg | ORAL_CAPSULE | Freq: Every day | ORAL | Status: DC
  Administered 2018-03-07: 300 mg via ORAL
  Filled 2018-03-07: qty 1

## 2018-03-07 MED ORDER — HYDROMORPHONE HCL 1 MG/ML IJ SOLN
0.5000 mg | INTRAMUSCULAR | Status: DC | PRN
  Administered 2018-03-08 (×2): 1 mg via INTRAVENOUS
  Filled 2018-03-07 (×2): qty 1

## 2018-03-07 MED ORDER — LIDOCAINE 2% (20 MG/ML) 5 ML SYRINGE
INTRAMUSCULAR | Status: AC
  Filled 2018-03-07: qty 5

## 2018-03-07 MED ORDER — DEXAMETHASONE SODIUM PHOSPHATE 4 MG/ML IJ SOLN
INTRAMUSCULAR | Status: DC | PRN
  Administered 2018-03-07: 5 mg via INTRAVENOUS

## 2018-03-07 MED ORDER — ALPRAZOLAM 0.25 MG PO TABS
0.2500 mg | ORAL_TABLET | Freq: Every day | ORAL | Status: DC
  Administered 2018-03-07: 0.25 mg via ORAL
  Filled 2018-03-07: qty 1

## 2018-03-07 MED ORDER — ROCURONIUM BROMIDE 10 MG/ML (PF) SYRINGE
PREFILLED_SYRINGE | INTRAVENOUS | Status: AC
  Filled 2018-03-07: qty 10

## 2018-03-07 MED ORDER — STERILE WATER FOR IRRIGATION IR SOLN
Status: DC | PRN
  Administered 2018-03-07: 1000 mL

## 2018-03-07 MED ORDER — MIDAZOLAM HCL 2 MG/2ML IJ SOLN
1.0000 mg | INTRAMUSCULAR | Status: DC | PRN
  Administered 2018-03-07: 2 mg via INTRAVENOUS
  Filled 2018-03-07: qty 2

## 2018-03-07 MED ORDER — SODIUM CHLORIDE 0.9 % IV SOLN
INTRAVENOUS | Status: AC
  Filled 2018-03-07: qty 500000

## 2018-03-07 MED ORDER — ALOSETRON HCL 0.5 MG PO TABS: 0.5000 mg | ORAL_TABLET | Freq: Two times a day (BID) | ORAL | Status: DC

## 2018-03-07 MED ORDER — ONDANSETRON HCL 4 MG/2ML IJ SOLN
INTRAMUSCULAR | Status: DC | PRN
  Administered 2018-03-07: 4 mg via INTRAVENOUS

## 2018-03-07 MED ORDER — RISAQUAD PO CAPS
1.0000 | ORAL_CAPSULE | Freq: Every day | ORAL | Status: DC
  Administered 2018-03-07 – 2018-03-08 (×2): 1 via ORAL
  Filled 2018-03-07 (×2): qty 1

## 2018-03-07 MED ORDER — ONDANSETRON HCL 4 MG PO TABS: 4.0000 mg | ORAL_TABLET | Freq: Four times a day (QID) | ORAL | Status: DC | PRN

## 2018-03-07 MED ORDER — PANTOPRAZOLE SODIUM 20 MG PO TBEC
20.0000 mg | DELAYED_RELEASE_TABLET | Freq: Every day | ORAL | Status: DC
  Administered 2018-03-08: 20 mg via ORAL
  Filled 2018-03-07: qty 1

## 2018-03-07 SURGICAL SUPPLY — 65 items
ANCH SUT 2 CP-2 EBND QANCHR+ (Anchor) ×1 IMPLANT
ANCH SUT SWLK 19.1X4.75 VT (Anchor) ×3 IMPLANT
ANCHOR NDL 9/16 CIR SZ 8 (NEEDLE) IMPLANT
ANCHOR NEEDLE 9/16 CIR SZ 8 (NEEDLE) IMPLANT
ANCHOR PEEK 4.75X19.1 SWLK C (Anchor) ×3 IMPLANT
ANCHOR SUPER QUICK (Anchor) ×1 IMPLANT
BAG DECANTER FOR FLEXI CONT (MISCELLANEOUS) ×1 IMPLANT
BLADE CUDA SHAVER 3.5 (BLADE) ×1 IMPLANT
BLADE CUTTER GATOR 3.5 (BLADE) ×1 IMPLANT
BLADE SURG SZ11 CARB STEEL (BLADE) ×2 IMPLANT
BUR OVAL 4.0 (BURR) IMPLANT
CANNULA ACUFO 5X76 (CANNULA) ×2 IMPLANT
CHLORAPREP W/TINT 26ML (MISCELLANEOUS) IMPLANT
CLEANER TIP ELECTROSURG 2X2 (MISCELLANEOUS) IMPLANT
CLOTH 2% CHLOROHEXIDINE 3PK (PERSONAL CARE ITEMS) ×2 IMPLANT
COVER SURGICAL LIGHT HANDLE (MISCELLANEOUS) ×2 IMPLANT
DECANTER SPIKE VIAL GLASS SM (MISCELLANEOUS) ×1 IMPLANT
DRAPE ORTHO SPLIT 77X108 STRL (DRAPES)
DRAPE POUCH INSTRU U-SHP 10X18 (DRAPES) IMPLANT
DRAPE STERI 35X30 U-POUCH (DRAPES) ×2 IMPLANT
DRAPE SURG ORHT 6 SPLT 77X108 (DRAPES) IMPLANT
DRSG AQUACEL AG ADV 3.5X 4 (GAUZE/BANDAGES/DRESSINGS) ×1 IMPLANT
DRSG AQUACEL AG ADV 3.5X 6 (GAUZE/BANDAGES/DRESSINGS) ×1 IMPLANT
DRSG EMULSION OIL 3X3 NADH (GAUZE/BANDAGES/DRESSINGS) IMPLANT
DRSG PAD ABDOMINAL 8X10 ST (GAUZE/BANDAGES/DRESSINGS) IMPLANT
DURAPREP 26ML APPLICATOR (WOUND CARE) ×2 IMPLANT
ELECT NDL TIP 2.8 STRL (NEEDLE) ×1 IMPLANT
ELECT NEEDLE TIP 2.8 STRL (NEEDLE) ×2 IMPLANT
GLOVE BIOGEL PI IND STRL 7.5 (GLOVE) ×1 IMPLANT
GLOVE BIOGEL PI INDICATOR 7.5 (GLOVE) ×1
GLOVE SURG SS PI 7.5 STRL IVOR (GLOVE) ×4 IMPLANT
GLOVE SURG SS PI 8.0 STRL IVOR (GLOVE) ×4 IMPLANT
GOWN STRL REUS W/TWL XL LVL3 (GOWN DISPOSABLE) ×4 IMPLANT
KIT BASIN OR (CUSTOM PROCEDURE TRAY) ×2 IMPLANT
KIT POSITION SHOULDER SCHLEI (MISCELLANEOUS) ×2 IMPLANT
MANIFOLD NEPTUNE II (INSTRUMENTS) ×2 IMPLANT
NDL SCORPION MULTI FIRE (NEEDLE) IMPLANT
NDL SPNL 18GX3.5 QUINCKE PK (NEEDLE) ×1 IMPLANT
NEEDLE SCORPION MULTI FIRE (NEEDLE) ×2 IMPLANT
NEEDLE SPNL 18GX3.5 QUINCKE PK (NEEDLE) IMPLANT
PACK SHOULDER (CUSTOM PROCEDURE TRAY) ×2 IMPLANT
POSITIONER SURGICAL ARM (MISCELLANEOUS) ×1 IMPLANT
PROBE BIPOLAR ATHRO 135MM 90D (MISCELLANEOUS) ×1 IMPLANT
SLING ARM IMMOBILIZER LRG (SOFTGOODS) IMPLANT
SLING ARM IMMOBILIZER MED (SOFTGOODS) IMPLANT
SLING ULTRA II S (ORTHOPEDIC SUPPLIES) ×1 IMPLANT
SPONGE LAP 4X18 RFD (DISPOSABLE) ×1 IMPLANT
STRIP CLOSURE SKIN 1/2X4 (GAUZE/BANDAGES/DRESSINGS) IMPLANT
SUCTION FRAZIER HANDLE 12FR (TUBING) ×1
SUCTION TUBE FRAZIER 12FR DISP (TUBING) IMPLANT
SUT BONE WAX W31G (SUTURE) IMPLANT
SUT ETHILON 4 0 PS 2 18 (SUTURE) ×2 IMPLANT
SUT PROLENE 3 0 PS 2 (SUTURE) ×1 IMPLANT
SUT TIGER TAPE 7 IN WHITE (SUTURE) ×1 IMPLANT
SUT VIC AB 1-0 CT2 27 (SUTURE) ×1 IMPLANT
SUT VIC AB 2-0 CT2 27 (SUTURE) IMPLANT
SUT VIC AB 2-0 SH 27 (SUTURE) ×2
SUT VIC AB 2-0 SH 27XBRD (SUTURE) IMPLANT
SUT VICRYL 0 UR6 27IN ABS (SUTURE) ×1 IMPLANT
SUT VICRYL RAPIDE 3 0 (SUTURE) ×1 IMPLANT
TAPE FIBER 2MM 7IN #2 BLUE (SUTURE) ×1 IMPLANT
TOWEL OR 17X26 10 PK STRL BLUE (TOWEL DISPOSABLE) ×2 IMPLANT
TUBING ARTHRO INFLOW-ONLY STRL (TUBING) ×2 IMPLANT
TUBING CONNECTING 10 (TUBING) ×2 IMPLANT
WAND HAND CNTRL MULTIVAC 90 (MISCELLANEOUS) IMPLANT

## 2018-03-07 NOTE — Anesthesia Procedure Notes (Signed)
Anesthesia Regional Block: Interscalene brachial plexus block   Pre-Anesthetic Checklist: ,, timeout performed, Correct Patient, Correct Site, Correct Laterality, Correct Procedure, Correct Position, site marked, Risks and benefits discussed,  Surgical consent,  Pre-op evaluation,  At surgeon's request and post-op pain management  Laterality: Left  Prep: chloraprep       Needles:  Injection technique: Single-shot  Needle Type: Echogenic Stimulator Needle     Needle Length: 5cm  Needle Gauge: 22     Additional Needles:   Narrative:  Start time: 03/07/2018 2:19 PM End time: 03/07/2018 2:29 PM Injection made incrementally with aspirations every 5 mL.  Performed by: Personally  Anesthesiologist: Duane Boston, MD  Additional Notes: Functioning IV was confirmed and monitors applied.  A 70mm 22ga echogenic arrow stimulator was used. Sterile prep and drape,hand hygiene and sterile gloves were used.Ultrasound guidance: relevant anatomy identified, needle position confirmed, local anesthetic spread visualized around nerve(s)., vascular puncture avoided.  Image printed for medical record.  Negative aspiration and negative test dose prior to incremental administration of local anesthetic. The patient tolerated the procedure well.

## 2018-03-07 NOTE — Brief Op Note (Signed)
03/07/2018  4:43 PM  PATIENT:  Michaela Raymond  62 y.o. female  PRE-OPERATIVE DIAGNOSIS:  Left shoulder rotator cuff tear  POST-OPERATIVE DIAGNOSIS:  Left shoulder rotator cuff tear  PROCEDURE:  Procedure(s) with comments: Left shoulder arthroscopy, subacromial decompression, mini open rotator cuff repair,  debridement (Left) - 90 mins  SURGEON:  Surgeon(s) and Role:    Susa Day, MD - Primary  PHYSICIAN ASSISTANT:   ASSISTANTS: [place]   ANESTHESIA:   general  EBL:  25 mL   BLOOD ADMINISTERED:none  DRAINS: none   LOCAL MEDICATIONS USED:  MARCAINE     SPECIMEN:  No Specimen  DISPOSITION OF SPECIMEN:  N/A  COUNTS:  YES  TOURNIQUET:  * No tourniquets in log *  DICTATION: .Other Dictation: Dictation Number [id number]  PLAN OF CARE: Admit for overnight observation  PATIENT DISPOSITION:  PACU - hemodynamically stable.   Delay start of Pharmacological VTE agent (>24hrs) due to surgical blood loss or risk of bleeding: no

## 2018-03-07 NOTE — Anesthesia Preprocedure Evaluation (Signed)
Anesthesia Evaluation  Patient identified by MRN, date of birth, ID band Patient awake    Reviewed: Allergy & Precautions, NPO status , Patient's Chart, lab work & pertinent test results  History of Anesthesia Complications Negative for: history of anesthetic complications  Airway Mallampati: III  TM Distance: >3 FB Neck ROM: Full    Dental  (+) Dental Advisory Given   Pulmonary sleep apnea and Continuous Positive Airway Pressure Ventilation , COPD,  COPD inhaler, Current Smoker,    breath sounds clear to auscultation       Cardiovascular hypertension, Pt. on medications (-) angina Rhythm:Regular Rate:Normal  '11 cath: EF 60-65%, normal coronaries '11 ECHO: Normal LVF, EF 55-60%, valves OK   Neuro/Psych Chronic back pain: narcotics    GI/Hepatic Neg liver ROS, GERD  Medicated and Controlled,S/p gastric bypass   Endo/Other  Hypothyroidism   Renal/GU negative Renal ROS     Musculoskeletal  (+) Arthritis , Osteoarthritis,  Fibromyalgia -, narcotic dependent  Abdominal   Peds  Hematology Hb 10.6   Anesthesia Other Findings   Reproductive/Obstetrics                             Anesthesia Physical  Anesthesia Plan  ASA: III  Anesthesia Plan: General   Post-op Pain Management:  Regional for Post-op pain   Induction: Intravenous  PONV Risk Score and Plan: 3 and Ondansetron, Dexamethasone and Scopolamine patch - Pre-op  Airway Management Planned: Oral ETT  Additional Equipment:   Intra-op Plan:   Post-operative Plan: Extubation in OR  Informed Consent: I have reviewed the patients History and Physical, chart, labs and discussed the procedure including the risks, benefits and alternatives for the proposed anesthesia with the patient or authorized representative who has indicated his/her understanding and acceptance.   Dental advisory given  Plan Discussed with: CRNA and  Surgeon  Anesthesia Plan Comments:         Anesthesia Quick Evaluation

## 2018-03-07 NOTE — Anesthesia Postprocedure Evaluation (Signed)
Anesthesia Post Note  Patient: Michaela Raymond  Procedure(s) Performed: Left shoulder arthroscopy, subacromial decompression, mini open rotator cuff repair,  debridement (Left )     Patient location during evaluation: PACU Anesthesia Type: General and Regional Level of consciousness: awake and alert Pain management: pain level controlled Vital Signs Assessment: post-procedure vital signs reviewed and stable Respiratory status: spontaneous breathing, nonlabored ventilation, respiratory function stable and patient connected to nasal cannula oxygen Cardiovascular status: blood pressure returned to baseline and stable Postop Assessment: no apparent nausea or vomiting Anesthetic complications: no    Last Vitals:  Vitals:   03/07/18 1730 03/07/18 1750  BP: (!) 152/85 (!) 143/75  Pulse: 62 (!) 56  Resp: 11   Temp: (!) 36.4 C (!) 36.4 C  SpO2: 100% 100%    Last Pain:  Vitals:   03/07/18 1750  TempSrc: Oral  PainSc:                  Ryan P Ellender

## 2018-03-07 NOTE — Progress Notes (Signed)
PHARMACIST - PHYSICIAN ORDER COMMUNICATION  CONCERNING: P&T Medication Policy on Herbal Medications  DESCRIPTION:  This patient's order for:  biotin  has been noted.  This product(s) is classified as an "herbal" or natural product. Due to a lack of definitive safety studies or FDA approval, nonstandard manufacturing practices, plus the potential risk of unknown drug-drug interactions while on inpatient medications, the Pharmacy and Therapeutics Committee does not permit the use of "herbal" or natural products of this type within [facility].   ACTION TAKEN: The pharmacy department is unable to verify this order at this time and your patient has been informed of this safety policy. Please reevaluate patient's clinical condition at discharge and address if the herbal or natural product(s) should be resumed at that time. Eudelia Bunch, Pharm.D [phone] 03/07/2018 6:42 PM

## 2018-03-07 NOTE — Discharge Instructions (Signed)

## 2018-03-07 NOTE — Progress Notes (Signed)
Assisted Dr. Singer with left, ultrasound guided, interscalene  block. Side rails up, monitors on throughout procedure. See vital signs in flow sheet. Tolerated Procedure well.  

## 2018-03-07 NOTE — Transfer of Care (Signed)
Immediate Anesthesia Transfer of Care Note  Patient: Michaela Raymond  Procedure(s) Performed: Left shoulder arthroscopy, subacromial decompression, mini open rotator cuff repair,  debridement (Left )  Patient Location: PACU  Anesthesia Type:General  Level of Consciousness: awake, alert  and oriented  Airway & Oxygen Therapy: Patient Spontanous Breathing and Patient connected to face mask oxygen  Post-op Assessment: Report given to RN and Post -op Vital signs reviewed and stable  Post vital signs: Reviewed and stable  Last Vitals:  Vitals Value Taken Time  BP 153/73 03/07/2018  5:01 PM  Temp    Pulse 69 03/07/2018  5:02 PM  Resp 12 03/07/2018  5:02 PM  SpO2 100 % 03/07/2018  5:02 PM  Vitals shown include unvalidated device data.  Last Pain:  Vitals:   03/07/18 1459  TempSrc:   PainSc: 0-No pain      Patients Stated Pain Goal: 5 (29/24/46 2863)  Complications: No apparent anesthesia complications

## 2018-03-07 NOTE — Interval H&P Note (Signed)
History and Physical Interval Note:  03/07/2018 2:22 PM  Michaela Raymond  has presented today for surgery, with the diagnosis of Left shoulder rotator cuff tear  The various methods of treatment have been discussed with the patient and family. After consideration of risks, benefits and other options for treatment, the patient has consented to  Procedure(s) with comments: Left shoulder arthroscopy, subacromial decompression, mini open rotator cuff repair, possible patch graft, debridement (Left) - 90 mins as a surgical intervention .  The patient's history has been reviewed, patient examined, no change in status, stable for surgery.  I have reviewed the patient's chart and labs.  Questions were answered to the patient's satisfaction.     [name] C

## 2018-03-07 NOTE — Anesthesia Procedure Notes (Signed)
Procedure Name: Intubation Performed by: Tonisha Silvey J, CRNA Pre-anesthesia Checklist: Patient identified, Emergency Drugs available, Suction available, Patient being monitored and Timeout performed Patient Re-evaluated:Patient Re-evaluated prior to induction Oxygen Delivery Method: Circle system utilized Preoxygenation: Pre-oxygenation with 100% oxygen Induction Type: IV induction Ventilation: Mask ventilation without difficulty Laryngoscope Size: Mac and 4 Grade View: Grade I Tube type: Oral Tube size: 7.0 mm Number of attempts: 1 Airway Equipment and Method: Stylet Placement Confirmation: ETT inserted through vocal cords under direct vision and positive ETCO2 Secured at: 21 cm Tube secured with: Tape Dental Injury: Teeth and Oropharynx as per pre-operative assessment        

## 2018-03-08 DIAGNOSIS — S46012A Strain of muscle(s) and tendon(s) of the rotator cuff of left shoulder, initial encounter: Secondary | ICD-10-CM | POA: Diagnosis not present

## 2018-03-08 LAB — BASIC METABOLIC PANEL
Anion gap: 8 (ref 5–15)
BUN: 12 mg/dL (ref 8–23)
CHLORIDE: 106 mmol/L (ref 98–111)
CO2: 24 mmol/L (ref 22–32)
Calcium: 8.5 mg/dL — ABNORMAL LOW (ref 8.9–10.3)
Creatinine, Ser: 0.67 mg/dL (ref 0.44–1.00)
GFR calc Af Amer: >60 mL/min (ref >60)
GLUCOSE: 152 mg/dL — AB (ref 70–99)
POTASSIUM: 4 mmol/L (ref 3.5–5.1)
Sodium: 138 mmol/L (ref 135–145)

## 2018-03-08 LAB — CBC
HCT: 36.4 % (ref 36.0–46.0)
Hemoglobin: 11.8 g/dL — ABNORMAL LOW (ref 12.0–15.0)
MCH: 30 pg (ref 26.0–34.0)
MCHC: 32.4 g/dL (ref 30.0–36.0)
MCV: 92.6 fL (ref 78.0–100.0)
PLATELETS: 375 10*3/uL (ref 150–400)
RBC: 3.93 MIL/uL (ref 3.87–5.11)
RDW: 15.5 % (ref 11.5–15.5)
WBC: 9.8 10*3/uL (ref 4.0–10.5)

## 2018-03-08 MED ORDER — HYDROMORPHONE HCL 2 MG PO TABS
2.0000 mg | ORAL_TABLET | ORAL | Status: DC | PRN
  Administered 2018-03-08: 2 mg via ORAL
  Filled 2018-03-08: qty 1

## 2018-03-08 NOTE — Progress Notes (Signed)
   Subjective: 1 Day Post-Op Procedure(s) (LRB): Left shoulder arthroscopy, subacromial decompression, mini open rotator cuff repair,  debridement (Left)  Pt c/o moderate pain this morning Denies any numbness or tingling  Ready for d/c home Patient reports pain as moderate.  Objective:   VITALS:   Vitals:   03/07/18 2213 03/08/18 0523  BP: 132/73 117/79  Pulse: 64 (!) 54  Resp: 16 16  Temp: 98.2 F (36.8 C) 98.4 F (36.9 C)  SpO2: 98% 96%    Left shoulder: dressing and sling in place nv intact distally No rashes or edema No signs of drainage or erythema  LABS Recent Labs    03/06/18 0840 03/08/18 0356  HGB 12.6 11.8*  HCT 39.1 36.4  WBC 7.2 9.8  PLT 402* 375    Recent Labs    03/06/18 0840 03/08/18 0356  NA 139 138  K 4.3 4.0  BUN 15 12  CREATININE 0.74 0.67  GLUCOSE 109* 152*     Assessment/Plan: 1 Day Post-Op Procedure(s) (LRB): Left shoulder arthroscopy, subacromial decompression, mini open rotator cuff repair,  debridement (Left) D/c home today F/u in 2 weeks with Dr. Tonita Cong Gentle exercises  Pulmonary toilet    Brad Luna [place], [place] is now [facility]  [place] [address] [place], [place],  [address] Phone: [phone] [email or web address]  [employer]

## 2018-03-08 NOTE — Progress Notes (Signed)
Occupational Therapy Evaluation Patient Details Name: Michaela Raymond MRN: [id number] DOB: 1956/03/22 Today's Date: 03/08/2018    History of Present Illness 62yo F s/p L rotator cuff repair   Clinical Impression   All OT education completed and pt questions answered. No further OT needs at this time. Will sign off.    Follow Up Recommendations  No OT follow up;Supervision - Intermittent    Equipment Recommendations  None recommended by OT    Recommendations for Other Services       Precautions / Restrictions Precautions Precautions: Shoulder Type of Shoulder Precautions: NWB L shoulder, no ROM shoulder Shoulder Interventions: Don joy ultra sling;Off for dressing/bathing/exercises Precaution Booklet Issued: Yes (comment) Required Braces or Orthoses: Sling Restrictions Weight Bearing Restrictions: Yes LUE Weight Bearing: Non weight bearing      Mobility Bed Mobility               General bed mobility comments: sitting EOB on arrival  Transfers Overall transfer level: Needs assistance Equipment used: None Transfers: Sit to/from Stand Sit to Stand: Supervision              Balance                                           ADL either performed or assessed with clinical judgement   ADL Overall ADL's : Needs assistance/impaired Eating/Feeding: Independent   Grooming: Set up           Upper Body Dressing : Minimal assistance;Sitting   Lower Body Dressing: Supervision/safety;Sit to/from stand   Toilet Transfer: Supervision/safety;Ambulation   Toileting- Clothing Manipulation and Hygiene: Supervision/safety   Tub/ Shower Transfer: Walk-in shower;Supervision/safety;Ambulation   Functional mobility during ADLs: Supervision/safety       Vision         Perception     Praxis      Pertinent Vitals/Pain Pain Assessment: 0-10 Pain Score: 2  Pain Location: L shoulder Pain Descriptors / Indicators: Aching;Sore Pain  Intervention(s): Monitored during session     Hand Dominance     Extremity/Trunk Assessment Upper Extremity Assessment Upper Extremity Assessment: LUE deficits/detail LUE Deficits / Details: no AROM L shoulder; otherwise WFL LUE: Unable to fully assess due to immobilization   Lower Extremity Assessment Lower Extremity Assessment: Overall WFL for tasks assessed   Cervical / Trunk Assessment Cervical / Trunk Assessment: Normal   Communication Communication Communication: No difficulties   Cognition Arousal/Alertness: Awake/alert Behavior During Therapy: WFL for tasks assessed/performed Overall Cognitive Status: Within Functional Limits for tasks assessed                                     General Comments  Provided shoulder protocol handout and pendulum exercise handout. Reviewed all. Practiced pendulum exercises, as well as AROM exercises of elbow, wrist, hand, neck    Exercises     Shoulder Instructions      Home Living Family/patient expects to be discharged to:: Private residence Living Arrangements: Alone Available Help at Discharge: Family;Available 24 hours/day;Other (Comment) Type of Home: House Home Access: Stairs to enter [employer] of Steps: 3 Entrance Stairs-Rails: Right;Left Home Layout: Two level;Bed/bath upstairs Alternate Level Stairs-Number of Steps: flight Alternate Level Stairs-Rails: Right;Left Bathroom Shower/Tub: Occupational psychologist: Standard     Home Equipment: Shower seat;Hand  held shower head;Cane - single point;Adaptive equipment Adaptive Equipment: Reacher        Prior Functioning/Environment Level of Independence: Independent                 OT Problem List: Decreased strength;Decreased range of motion;Decreased knowledge of precautions;Pain;Impaired UE functional use      OT Treatment/Interventions:      OT Goals(Current goals can be found in the care plan section) Acute Rehab OT  Goals Patient Stated Goal: home today OT Goal Formulation: All assessment and education complete, DC therapy  OT Frequency:     Barriers to D/C:            Co-evaluation              AM-PAC PT "6 Clicks" Daily Activity     Outcome Measure Help from another person eating meals?: None Help from another person taking care of personal grooming?: None Help from another person toileting, which includes using toliet, bedpan, or urinal?: A Little Help from another person bathing (including washing, rinsing, drying)?: A Little Help from another person to put on and taking off regular upper body clothing?: A Little Help from another person to put on and taking off regular lower body clothing?: A Little 6 Click Score: 20   End of Session Nurse Communication: Mobility status  Activity Tolerance: Patient tolerated treatment well Patient left: in chair  OT Visit Diagnosis: Muscle weakness (generalized) (M62.81)                Time: 8177-1165 OT Time Calculation (min): 38 min Charges:  OT General Charges $OT Visit: 1 Visit OT Evaluation $OT Eval Low Complexity: 1 Low OT Treatments $Self Care/Home Management : 8-22 mins $Therapeutic Exercise: 8-22 mins   Bartlomiej Jenkinson A Sister Carbone 03/08/2018, 1:29 PM

## 2018-03-08 NOTE — Plan of Care (Signed)
  Problem: Health Behavior/Discharge Planning: Goal: Ability to manage health-related needs will improve Outcome: Progressing   Problem: Clinical Measurements: Goal: Ability to maintain clinical measurements within normal limits will improve Outcome: Progressing Goal: Will remain free from infection Outcome: Progressing Goal: Diagnostic test results will improve Outcome: Progressing Goal: Respiratory complications will improve Outcome: Progressing Goal: Cardiovascular complication will be avoided Outcome: Progressing   Problem: Activity: Goal: Risk for activity intolerance will decrease Outcome: Progressing   Problem: Nutrition: Goal: Adequate nutrition will be maintained Outcome: Progressing   Problem: Coping: Goal: Level of anxiety will decrease Outcome: Progressing   Problem: Elimination: Goal: Will not experience complications related to bowel motility Outcome: Progressing Goal: Will not experience complications related to urinary retention Outcome: Progressing   Problem: Pain Managment: Goal: General experience of comfort will improve Outcome: Progressing   Problem: Safety: Goal: Ability to remain free from injury will improve Outcome: Progressing   Problem: Skin Integrity: Goal: Risk for impaired skin integrity will decrease Outcome: Progressing   Problem: Knowledge Deficit Goal: Patient/family/caregiver demonstrates understanding of disease process, treatment plan, medications, and discharge instructions Description Complete learning assessment and assess knowledge base. Outcome: Progressing   Problem: Potential for Falls Goal: Patient will remain free of falls Description Assess and monitor vitals signs, neurological status including level of consciousness and orientation.  Reassess fall risk per hospital policy.  Ensure arm band on, uncluttered walking paths in room, adequate room lighting, call light and overbed table within reach, bed in low  position, wheels locked, side rails up per policy, and non-skid footwear provided.  Outcome: Progressing   Problem: Education: Goal: Knowledge of the prescribed therapeutic regimen will improve Outcome: Progressing Goal: Understanding of activity limitations/precautions following surgery will improve Outcome: Progressing Goal: Individualized Educational Video(s) Outcome: Progressing   Problem: Activity: Goal: Ability to tolerate increased activity will improve Outcome: Progressing   Problem: Pain Management: Goal: Pain level will decrease with appropriate interventions Outcome: Progressing

## 2018-03-08 NOTE — Op Note (Signed)
NAME: Michaela Raymond, Michaela Raymond MEDICAL RECORD [id number] ACCOUNT [id number] DATE OF BIRTH:September 02, 1955 FACILITY: [facility] LOCATION: [facility] PHYSICIAN:Aiko Belko Windy Kalata, MD  OPERATIVE REPORT  DATE OF PROCEDURE:  03/07/2018  PREOPERATIVE DIAGNOSIS:  Rotator cuff tear, glenohumeral arthrosis, left shoulder.  POSTOPERATIVE DIAGNOSIS:  Rotator cuff tear, glenohumeral arthrosis, left shoulder.  PROCEDURE PERFORMED: 1.  Left shoulder arthroscopy. 2.  Subacromial decompression acromioplasty. 3.  Mini open rotator cuff repair utilizing SwiveLock suture anchors and Mitek suture anchors.  ANESTHESIA:  General with regional block.  SURGEON:  Susa Day, MD  ASSISTANT:  Lacie Draft, PA  HISTORY:  A 62 year old with retracted tear of the rotator cuff with acute mild atrophy of the rotator cuff, no fatty atrophy.  Glenohumeral arthrosis.  She has had severe pain.  We discussed living with her symptoms versus consideration of shoulder  arthroplasty versus a rotator cuff repair.  We discussed the risks and benefits of that including bleeding, infection, inability to repair, need for patch graft, making sure not to over tension the rotator cuff to exacerbate the glenohumeral arthrosis.   We discussed proceeding with the latter.  Again, risks and benefits discussed including inability to repair requiring a right shoulder replacement in the future.  DESCRIPTION OF PROCEDURE:  The patient supine beach chair position.  After induction of adequate general anesthesia and 2 grams Kefzol she was placed in the beach chair position.  Left upper extremity was prepped and draped in the usual sterile fashion.   A surgical marker was utilized to delineate the acromion, AC joint and coracoid.  Standard posterolateral portal was utilized with an incision through the skin only.  Arthroscopic camera was then inserted 65 mmHg.  I advanced it as the rotator cuff was  torn and retracted.  In the glenohumeral joint, we evacuated  and lavaged the joint, made a separate anterolateral portal with an incision through the skin only with a #11 blade triangulating the subacromial space with a cannula.  I introduced the shaver  and performed a bursectomy.  The tear was noted to be cleaved in multiple planes and retracted both supraspinatus and infraspinatus and a portion of the subscap.  There were some grade IV changes of the glenohumeral joint noted centrally.  Some fraying  of the labrum and the residual biceps tendon tear.  We debrided the stump of the biceps tendon.  I performed a gentle synovectomy.  We then used a pituitary mobilizing the rotator cuff and it did appear that this was able to be repaired without undue  tension.  However, with a broad rotator cuff tear and the cleavage planes, I felt it was best to convert to a mini open rotator cuff repair.  I partially released the CA ligament as well and shaved the anterolateral aspect of the acromion to remove all  arthroscopic equipment, closed the portals with 4-0 nylon simple sutures.  I made a 2 cm incision over the anterolateral aspect of the acromion.  Subcutaneous tissue was dissected with Cardiolite to achieve hemostasis.  The rhaphe between anterolateral  heads was divided in line with skin incision.  A self-retaining retractor was placed.  A 3 mm Kerrison was utilized to perform an acromioplasty removing a small anterolateral spur.  I digitally lysed adhesions and proceeded with resection of a residual  portion of the bursa and debrided the ends of the rotator cuff further mobilizing the supraspinatus, subscapularis, and infraspinatus.  I fashioned a trough in the greater tuberosity lateral to the articular surface from posterior to anterior.  We  further mobilized the cuff with the arm in the neutral position and at her side was able to advance the infraspinatus, supraspinatus, subscap leaflets to that trough without undue tension.  I therefore felt again it was repairable.   There were only 3  suture SwiveLock anchors left so we decided to place 1 proximally in the mid portion of that bed and threading 2 tiger tape suture loops through that.  After fashioning that with all inserting the SwiveLocks excellent resistance to pullout and with a  Scorpion suture passer, passed 2 leaflets posteriorly, 2 leaflets anteriorly.  Then, advancing the tendon proximally a centimeter to 2 cm, I crossed it over the greater tuberosity and anchored them into 2 SwiveLock anchors over the lateral aspect of the  greater tuberosity fashioning 2 separate holes with an awl inserting the SwiveLock again with the arm in the neutral position at her side without undue tension.  Excellent resistance and pullout.  We had excellent full coverage.  Removed redundant  suture.  There was a small area where the subscap came over the bicipital groove and a portion of the supraspinatus there that I felt required additional securing.  I then took an available Mitek suture anchor and placed it there just lateral to the  bicipital groove and threaded up through that remaining portion of the subscap and the supraspinatus and delivered it to the ____ suture removed.  There was some attenuation of the tendon in the mid portion.  I felt there was a reasonable repair.  We  copiously irrigated the wound had lavage of glenohumeral joint as well.  I then copiously irrigated the wound, closed the raphae with 0 Vicryl interrupted figure-of-eight sutures.  We had retained the deltoid attachment.  This was excellent repair of  this.  Then subcutaneous with 2-0 and skin with Prolene.  Sterile dressing applied, placed in a sling, extubated without difficulty and transported to the recovery room in satisfactory condition.  The patient tolerated the procedure well.  No complications.  Assistant was [employer], [place].  Minimal blood loss.  TN/NUANCE  D:03/07/2018 T:03/07/2018 JOB:[id number]

## 2018-03-08 NOTE — Discharge Summary (Signed)
Orthopedic Discharge Summary        Physician Discharge Summary  Patient ID: COVA KNIERIEM MRN: [id number] DOB/AGE: [birth year] 62 y.o.  Admit date: 03/07/2018 Discharge date: 03/08/2018   Procedures:  Procedure(s) (LRB): Left shoulder arthroscopy, subacromial decompression, mini open rotator cuff repair,  debridement (Left)  Attending Physician:  Dr. Tonita Cong  Admission Diagnoses:   Left shoulder cuff tear  Discharge Diagnoses:  Left shoulder cuff tear   Past Medical History:  Diagnosis Date  . Adenoma    of thyroid gland, benign  . Anemia    hx. of with grastric bypass surgery  . Angina November 2011   Normal coronary angiogram; noncardiac symptom  . Anxiety   . Arthritis    osteoarthrits  . Asthma   . Barrett's esophagus   . Bronchitis   . Chronic kidney disease    cyst on right kidney hx of  . Complication of anesthesia    woke up during colonoscopy, twlight surgery with knee surgery pt. stopped breathing  and they did her under general  . COPD (chronic obstructive pulmonary disease) ([place])    pt. denies  . Dyspnea    only with heavy exercise  . Dysrhythmia   . Fibromyalgia   . GERD (gastroesophageal reflux disease)   . Heart murmur   . Hyperlipidemia   . Hypertension   . Hypothyroidism   . Morbid obesity ([place])    Status post gastric bypass surgery September 2013  . Osteopenia   . Osteoporosis   . Pneumonia   . Sleep apnea    cpap machine x 9 years    PCP: Nena Polio, NP   Discharged Condition: good  Hospital Course:  Patient underwent the above stated procedure on 03/07/2018. Patient tolerated the procedure well and brought to the recovery room in good condition and subsequently to the floor. Patient had an uncomplicated hospital course and was stable for discharge.   Disposition: Discharge disposition: 01-Home or Self Care      with follow up in 2 weeks   Follow-up Information    Susa Day, MD Follow up in 2 week(s).     Specialty:  Orthopedic Surgery Contact information: [address] [place] [address] [phone]           Discharge Instructions    Call MD / Call 911   Complete by:  As directed    If you experience chest pain or shortness of breath, CALL 911 and be transported to the hospital emergency room.  If you develope a fever above 101 F, pus (white drainage) or increased drainage or redness at the wound, or calf pain, call your surgeon's office.   Constipation Prevention   Complete by:  As directed    Drink plenty of fluids.  Prune juice may be helpful.  You may use a stool softener, such as Colace (over the counter) 100 mg twice a day.  Use MiraLax (over the counter) for constipation as needed.   Diet - low sodium heart healthy   Complete by:  As directed    Increase activity slowly as tolerated   Complete by:  As directed       Allergies as of 03/08/2018      Reactions   Sulfa Antibiotics Anaphylaxis, Hives, Itching, Swelling   Azithromycin Nausea And Vomiting   Severe Abdominal cramping Other reaction(s): Abdominal Pain Severe abd cramping   Fenofibrate Nausea Only   Tape Hives, Itching   Only use paper or cloth  tape   Coreg Other (See Comments)   Patient doesn't remember the reaction   Ibuprofen Other (See Comments)   Patient doesn't remember reaction   Verapamil Other (See Comments)   Patient doesn't recall reaction   Asa [aspirin] Other (See Comments)   GI upset   Escitalopram Oxalate Anxiety, Other (See Comments)   Panic attack, also   Talwin [pentazocine Lactate] Nausea And Vomiting      Medication List    STOP taking these medications   HYDROcodone-acetaminophen 5-325 MG tablet Commonly known as:  NORCO/VICODIN   HYDROmorphone 2 MG tablet Commonly known as:  DILAUDID   oxyCODONE-acetaminophen 5-325 MG tablet Commonly known as:  PERCOCET/ROXICET     TAKE these medications   alosetron 0.5 MG tablet Commonly known as:  LOTRONEX Take  0.5 mg by mouth 2 (two) times daily.   ALPRAZolam 0.5 MG tablet Commonly known as:  XANAX Take 0.25 mg by mouth at bedtime.   amoxicillin 500 MG capsule Commonly known as:  AMOXIL Take 2,000 mg by mouth See admin instructions. Take 2000 mg by mouth 1 hour prior to dental procedures   Biotin 5 MG Caps Take 5 mg by mouth daily.   calcium citrate-vitamin D 315-200 MG-UNIT tablet Commonly known as:  CITRACAL+D Take 2 tablets by mouth 2 (two) times daily.   CLIMARA PRO 0.045-0.015 MG/DAY Generic drug:  estradiol-levonorgestrel Place 1 patch onto the skin every Friday.   cyclobenzaprine 10 MG tablet Commonly known as:  FLEXERIL Take 1 tablet (10 mg total) by mouth 3 (three) times daily as needed for muscle spasms.   D 5000 5000 units Tabs Generic drug:  Cholecalciferol Take 5,000 Units by mouth daily.   docusate sodium 100 MG capsule Commonly known as:  COLACE Take 1 capsule (100 mg total) by mouth 2 (two) times daily as needed for mild constipation.   fluticasone 50 MCG/ACT nasal spray Commonly known as:  FLONASE Place 2 sprays into both nostrils daily. PATIENT NEEDS OFFICE VISIT FOR ADDITIONAL REFILLS What changed:  additional instructions   gabapentin 100 MG capsule Commonly known as:  NEURONTIN Take 100 mg by mouth daily.   gabapentin 300 MG capsule Commonly known as:  NEURONTIN Take 300 mg by mouth at bedtime.   levocetirizine 5 MG tablet Commonly known as:  XYZAL Take 5 mg by mouth at bedtime.   levothyroxine 137 MCG tablet Commonly known as:  SYNTHROID, LEVOTHROID Take 137 mcg by mouth daily before breakfast.   lidocaine 5 % Commonly known as:  LIDODERM Place 1-2 patches onto the skin daily. Remove & Discard patch within 12 hours. PATIENT NEEDS OFFICE VISIT FOR MORE What changed:    how much to take  additional instructions   losartan 50 MG tablet Commonly known as:  COZAAR Take 50 mg by mouth daily.   methocarbamol 500 MG tablet Commonly known as:   ROBAXIN Take 1 tablet (500 mg total) by mouth every 6 (six) hours as needed for muscle spasms. What changed:    when to take this  reasons to take this   montelukast 10 MG tablet Commonly known as:  SINGULAIR Take 1 tablet (10 mg total) by mouth at bedtime. PATIENT NEEDS OFFICE VISIT FOR ADDITIONAL REFILLS - 2nd NOTICE What changed:  additional instructions   multivitamin tablet Take 1 tablet by mouth daily.   naproxen sodium 220 MG tablet Commonly known as:  ALEVE Take 440 mg by mouth 2 (two) times daily as needed (for pain or headache).  oxyCODONE 5 MG immediate release tablet Commonly known as:  Oxy IR/ROXICODONE Take 1-2 tablets (5-10 mg total) by mouth every 4 (four) hours as needed.   pantoprazole 20 MG tablet Commonly known as:  PROTONIX Take 20 mg by mouth daily.   sucralfate 1 GM/10ML suspension Commonly known as:  CARAFATE Take 1 g by mouth 3 (three) times daily as needed (when taking NSAIDS).   TYLENOL 8 HOUR 650 MG CR tablet Generic drug:  acetaminophen Take 1,300 mg by mouth 2 (two) times daily.         Signed: Ventura Bruns 03/08/2018, 9:15 AM  [facility] is now [facility] [address], [place], [place], [place] [address] Phone: [place]

## 2018-03-10 ENCOUNTER — Encounter (HOSPITAL_COMMUNITY): Payer: Self-pay | Admitting: Specialist

## 2018-06-17 ENCOUNTER — Other Ambulatory Visit: Payer: Self-pay | Admitting: Neurological Surgery

## 2018-06-17 DIAGNOSIS — M48062 Spinal stenosis, lumbar region with neurogenic claudication: Secondary | ICD-10-CM

## 2018-06-19 ENCOUNTER — Other Ambulatory Visit: Payer: Self-pay

## 2018-06-19 MED ORDER — ALPRAZOLAM 0.5 MG PO TABS: 0.2500 mg | ORAL_TABLET | Freq: Every day | ORAL | 1 refills | Status: DC

## 2018-06-19 NOTE — Addendum Note (Signed)
Addended by: Reatha Armour on: 06/19/2018 01:30 PM   Modules accepted: Orders

## 2018-07-02 ENCOUNTER — Other Ambulatory Visit: Payer: BLUE CROSS/BLUE SHIELD

## 2019-01-12 ENCOUNTER — Other Ambulatory Visit: Payer: Self-pay | Admitting: Psychiatry

## 2019-01-13 NOTE — Telephone Encounter (Signed)
Do you recognize this patient? Not seen in epic and receives a lot of pain medications from other physicians.  Last fill was in Feb. For 90 day

## 2019-01-13 NOTE — Telephone Encounter (Signed)
Please schedule patient for an appointment.  I sent in the 90-day refill she requested for Xanax.  She is not been seen since we have been in epic.

## 2019-01-13 NOTE — Telephone Encounter (Signed)
I do know her. OK refill.

## 2019-05-02 ENCOUNTER — Other Ambulatory Visit: Payer: Self-pay | Admitting: Psychiatry

## 2019-05-03 NOTE — Telephone Encounter (Signed)
Chart needs to be pulled not seen in epic

## 2019-05-29 ENCOUNTER — Other Ambulatory Visit: Payer: Self-pay | Admitting: Psychiatry

## 2019-05-29 NOTE — Telephone Encounter (Signed)
Nothing in epic, need to pull paper chart check last visit

## 2019-06-02 ENCOUNTER — Other Ambulatory Visit: Payer: Self-pay | Admitting: Psychiatry

## 2019-06-02 NOTE — Telephone Encounter (Signed)
Last apt 11/2017, nothing scheduled

## 2019-06-03 ENCOUNTER — Other Ambulatory Visit: Payer: Self-pay | Admitting: Psychiatry

## 2019-06-04 ENCOUNTER — Other Ambulatory Visit: Payer: Self-pay | Admitting: Psychiatry

## 2019-06-17 ENCOUNTER — Other Ambulatory Visit: Payer: Self-pay

## 2019-06-17 ENCOUNTER — Ambulatory Visit
Admission: EM | Admit: 2019-06-17 | Discharge: 2019-06-17 | Disposition: A | Discharge status: Home / Self Care | Payer: BLUE CROSS/BLUE SHIELD | Attending: Physician Assistant | Admitting: Physician Assistant

## 2019-06-17 DIAGNOSIS — Z20828 Contact with and (suspected) exposure to other viral communicable diseases: Secondary | ICD-10-CM | POA: Diagnosis not present

## 2019-06-17 DIAGNOSIS — Z20822 Contact with and (suspected) exposure to covid-19: Secondary | ICD-10-CM

## 2019-06-17 NOTE — ED Provider Notes (Signed)
[facility]    CSN: [id number] Arrival date & time: 06/17/19  1612      History   Chief Complaint Chief Complaint  Patient presents with  . COVID exposure    HPI Michaela Raymond is a 63 y.o. female.   63 year old feamel   comes in for COVID testing after positive exposure 5 days ago. Patient remains asymptomatic. Denies URI symptoms such as cough, congestion, sore throat. Denies fever, chills, body aches. Denies abdominal pain, nausea, vomiting, diarrhea. Denies shortness of breath, loss of taste/smell. No antipyretics in the last 8 hours.      Past Medical History:  Diagnosis Date  . Adenoma    of thyroid gland, benign  . Anemia    hx. of with grastric bypass surgery  . Angina November 2011   Normal coronary angiogram; noncardiac symptom  . Anxiety   . Arthritis    osteoarthrits  . Asthma   . Barrett's esophagus   . Bronchitis   . Chronic kidney disease    cyst on right kidney hx of  . Complication of anesthesia    woke up during colonoscopy, twlight surgery with knee surgery pt. stopped breathing  and they did her under general  . COPD (chronic obstructive pulmonary disease) ([place])    pt. denies  . Dyspnea    only with heavy exercise  . Dysrhythmia   . Fibromyalgia   . GERD (gastroesophageal reflux disease)   . Heart murmur   . Hyperlipidemia   . Hypertension   . Hypothyroidism   . Morbid obesity ([place])    Status post gastric bypass surgery September 2013  . Osteopenia   . Osteoporosis   . Pneumonia   . Sleep apnea    cpap machine x 9 years    Patient Active Problem List   Diagnosis Date Noted  . Complete rotator cuff tear 03/07/2018  . Lumbar adjacent segment disease with spondylolisthesis 06/04/2017  . Chronic pain syndrome 07/13/2016  . Post laminectomy syndrome 07/13/2016  . Spinal stenosis of lumbar region with neurogenic claudication 07/13/2016  . Tobacco abuse counseling 07/19/2015  . Essential hypertension 04/02/2013  .  Hyperlipidemia   . History of Morbid obesity - status post gastric bypass surgery   . Other abnormal glucose 10/25/2011  . Other malaise and fatigue 10/25/2011  . Myalgia and myositis, unspecified 10/25/2011  . Degeneration of lumbar or lumbosacral intervertebral disc 10/25/2011  . Degeneration of cervical intervertebral disc 10/25/2011  . OSA on CPAP 10/25/2011  . GERD (gastroesophageal reflux disease) 10/25/2011  . IBS (irritable bowel syndrome) 10/25/2011  . COPD (chronic obstructive pulmonary disease) ([place]) 10/25/2011  . Other affections of shoulder region, not elsewhere classified 06/26/2011  . Post herpetic neuralgia 09/16/2003    Past Surgical History:  Procedure Laterality Date  . BACK SURGERY     spinal fusion  . CARDIAC CATHETERIZATION  05/03/2010   No significant CAD in L coronary system, normal LV function  . CARDIOVASCULAR STRESS TEST  04/15/2007   EKG negative for ischemia, no significant ischemia demonstrated.  . COLONOSCOPY    . DILATION AND CURETTAGE OF UTERUS    . ESOPHAGOGASTRODUODENOSCOPY    . JOINT REPLACEMENT    . KNEE ARTHROPLASTY     bilaterally  . LAPAROSCOPIC ROUX-EN-Y GASTRIC BYPASS WITH UPPER ENDOSCOPY AND REMOVAL OF LAP BAND    . NASAL SINUS SURGERY     1989 and mucous cele removed  . ROTATOR CUFF REPAIR     2001  .  SHOULDER ARTHROSCOPY WITH OPEN ROTATOR CUFF REPAIR Left 03/07/2018   Procedure: Left shoulder arthroscopy, subacromial decompression, mini open rotator cuff repair,  debridement;  Surgeon: Susa Day, MD;  Location: [facility];  Service: Orthopedics;  Laterality: Left;  90 mins  . THYROIDECTOMY    . TONSILLECTOMY     and adnoids removed  . TRANSTHORACIC ECHOCARDIOGRAM  05/03/2021   EF 55-65%, normal    OB History   No obstetric history on file.      Home Medications    Prior to Admission medications   Medication Sig Start Date End Date Taking? Authorizing Provider  naproxen sodium (ALEVE) 220 MG tablet Take 440 mg by mouth 2  (two) times daily as needed (for pain or headache).    Yes [provider]  acetaminophen (TYLENOL 8 HOUR) 650 MG CR tablet Take 1,300 mg by mouth 2 (two) times daily.    [provider]  ALPRAZolam Duanne Moron) 0.5 MG tablet TAKE ONE TABLET BY MOUTH AT BEDTIME 01/13/19   Cottle, Billey Co., MD  Biotin 5 MG CAPS Take 5 mg by mouth daily.     [provider]  calcium citrate-vitamin D (CITRACAL+D) 315-200 MG-UNIT tablet Take 2 tablets by mouth 2 (two) times daily.     [provider]  Cholecalciferol (D 5000) 5000 units TABS Take 5,000 Units by mouth daily.    [provider]  CLIMARA PRO 0.045-0.015 MG/DAY Place 1 patch onto the skin every Friday.  06/29/14   [provider]  docusate sodium (COLACE) 100 MG capsule Take 1 capsule (100 mg total) by mouth 2 (two) times daily as needed for mild constipation. 03/07/18   Cecilie Kicks, PA-C  fluticasone (FLONASE) 50 MCG/ACT nasal spray Place 2 sprays into both nostrils daily. PATIENT NEEDS OFFICE VISIT FOR ADDITIONAL REFILLS Patient taking differently: Place 2 sprays into both nostrils daily.     Marte, Heather M, PA-C  gabapentin (NEURONTIN) 100 MG capsule Take 100 mg by mouth daily.     [provider]  gabapentin (NEURONTIN) 300 MG capsule Take 300 mg by mouth at bedtime.    [provider]  levocetirizine (XYZAL) 5 MG tablet Take 5 mg by mouth at bedtime.  06/15/14   [provider]  levothyroxine (SYNTHROID, LEVOTHROID) 137 MCG tablet Take 137 mcg by mouth daily before breakfast.    [provider]  lidocaine (LIDODERM) 5 % Place 1-2 patches onto the skin daily. Remove & Discard patch within 12 hours. PATIENT NEEDS OFFICE VISIT FOR MORE Patient taking differently: Place 1 patch onto the skin daily. Remove & Discard patch within 12 hours 12/23/13   Darlyne Russian, MD  losartan (COZAAR) 50 MG tablet Take 50 mg by mouth daily.    [provider]  montelukast  (SINGULAIR) 10 MG tablet Take 1 tablet (10 mg total) by mouth at bedtime. PATIENT NEEDS OFFICE VISIT FOR ADDITIONAL REFILLS - 2nd NOTICE Patient taking differently: Take 10 mg by mouth at bedtime.  07/14/13   Theda Sers, PA-C  Multiple Vitamin (MULTIVITAMIN) tablet Take 1 tablet by mouth daily.    [provider]  pantoprazole (PROTONIX) 20 MG tablet Take 20 mg by mouth daily.  06/22/14   [provider]  sucralfate (CARAFATE) 1 GM/10ML suspension Take 1 g by mouth 3 (three) times daily as needed (when taking NSAIDS).    [provider]    Family History Family History  Problem Relation Age of Onset  . Mental  illness Sister   . Hypertension Brother   . Heart murmur Brother   . Arrhythmia Maternal Grandmother        Atrial Fibrillation  . COPD Maternal Grandmother   . Cancer Maternal Grandfather   . Alzheimer's disease Paternal Grandmother   . COPD Paternal Grandfather   . Heart failure Paternal Grandfather   . Allergies Son   . Allergies Daughter   . Allergies Daughter   . Diabetes Father   . Hypertension Father   . Heart failure Father   . COPD Father   . Heart attack Father   . Arthritis Father   . Heart murmur Mother   . Hypothyroidism Mother   . Heart disease Mother     Social History Social History   Tobacco Use  . Smoking status: Current Every Day Smoker    Packs/day: 0.50    Years: 30.00    Pack years: 15.00    Start date: 02/19/2012  . Smokeless tobacco: Never Used  Substance Use Topics  . Alcohol use: Yes    Alcohol/week: 0.0 standard drinks    Comment: occassional wine  . Drug use: No     Allergies   Sulfa antibiotics, Azithromycin, Fenofibrate, Tape, Coreg, Ibuprofen, Verapamil, Asa [aspirin], Escitalopram oxalate, and Talwin [pentazocine lactate]   Review of Systems Review of Systems  Reason unable to perform ROS: See HPI as above.     Physical Exam Triage Vital Signs ED Triage Vitals  Enc Vitals Group     BP  06/17/19 1634 (!) 142/72     Pulse Rate 06/17/19 1634 63     Resp 06/17/19 1634 18     Temp 06/17/19 1634 98.4 F (36.9 C)     Temp Source 06/17/19 1634 Oral     SpO2 06/17/19 1634 94 %     Weight --      Height --      Head Circumference --      Peak Flow --      Pain Score 06/17/19 1635 0     Pain Loc --      Pain Edu? --      Excl. in [place]? --    No data found.  Updated Vital Signs BP (!) 142/72 (BP Location: Left Arm)   Pulse 63   Temp 98.4 F (36.9 C) (Oral)   Resp 18   SpO2 94%   Physical Exam Constitutional:      General: She is not in acute distress.    Appearance: Normal appearance. She is not ill-appearing, toxic-appearing or diaphoretic.  HENT:     Head: Normocephalic and atraumatic.     Mouth/Throat:     Mouth: Mucous membranes are moist.     Pharynx: Oropharynx is clear. Uvula midline.  Cardiovascular:     Rate and Rhythm: Normal rate and regular rhythm.     Heart sounds: Normal heart sounds. No murmur. No friction rub. No gallop.   Pulmonary:     Effort: Pulmonary effort is normal. No accessory muscle usage, prolonged expiration, respiratory distress or retractions.     Comments: Lungs clear to auscultation without adventitious lung sounds. Musculoskeletal:     Cervical back: Normal range of motion and neck supple.  Neurological:     General: No focal deficit present.     Mental Status: She is alert and oriented to person, place, and time.      UC Treatments / Results  Labs (all labs ordered are listed, but only abnormal  results are displayed) Labs Reviewed  NOVEL CORONAVIRUS, NAA    EKG   Radiology No results found.  Procedures Procedures (including critical care time)  Medications Ordered in UC Medications - No data to display  Initial Impression / Assessment and Plan / UC Course  I have reviewed the triage vital signs and the nursing notes.  Pertinent labs & imaging results that were available during my care of the patient were  reviewed by me and considered in my medical decision making (see chart for details).    Discussed with patient, given positive exposure without symptoms, could still be within incubation period. If develop symptoms, may need retesting.  Patient expresses understanding and would like to proceed with testing.  COVID testing ordered.  Patient will continue to monitor symptoms.  To self quarantine if develop any symptoms.  Return precautions given.  Final Clinical Impressions(s) / UC Diagnoses   Final diagnoses:  Close exposure to COVID-19 virus   ED Prescriptions    None     PDMP not reviewed this encounter.   Ok Edwards, PA-C 06/17/19 1756

## 2019-06-17 NOTE — Discharge Instructions (Signed)
COVID testing ordered. As discussed, given recent exposure without symptoms, you may still be in incubation period. Monitor for any symptoms such as cough, congestion, shortness of breath, loss of taste/smell, fever, to start self quarantine and may need retesting. Go to the emergency department for further evaluation if you develop significant shortness of breath, cannot speak in full sentences.  

## 2019-06-17 NOTE — ED Triage Notes (Signed)
Pt states exposure to COVID on 12/25. Pt denies any sx's

## 2019-06-18 LAB — NOVEL CORONAVIRUS, NAA: SARS-CoV-2, NAA: NOT DETECTED

## 2021-07-19 ENCOUNTER — Telehealth: Payer: Self-pay | Admitting: Physical Medicine and Rehabilitation

## 2021-07-19 NOTE — Telephone Encounter (Signed)
Patient called. She would like an appointment with Dr. Ernestina Patches. Her call back number is [phone]

## 2021-09-07 ENCOUNTER — Other Ambulatory Visit (HOSPITAL_COMMUNITY): Payer: Self-pay | Admitting: Neurological Surgery

## 2021-09-07 ENCOUNTER — Other Ambulatory Visit: Payer: Self-pay | Admitting: Neurological Surgery

## 2021-09-07 DIAGNOSIS — G959 Disease of spinal cord, unspecified: Secondary | ICD-10-CM

## 2021-09-27 ENCOUNTER — Ambulatory Visit (HOSPITAL_COMMUNITY)
Admission: RE | Admit: 2021-09-27 | Discharge: 2021-09-27 | Disposition: A | Discharge status: Home / Self Care | Payer: Medicare Other | Source: Ambulatory Visit | Attending: Neurological Surgery | Admitting: Neurological Surgery

## 2021-09-27 ENCOUNTER — Other Ambulatory Visit: Payer: Self-pay

## 2021-09-27 DIAGNOSIS — G959 Disease of spinal cord, unspecified: Secondary | ICD-10-CM

## 2021-09-27 DIAGNOSIS — M4714 Other spondylosis with myelopathy, thoracic region: Secondary | ICD-10-CM | POA: Insufficient documentation

## 2021-09-27 MED ORDER — DIAZEPAM 5 MG PO TABS
10.0000 mg | ORAL_TABLET | Freq: Once | ORAL | Status: AC
  Administered 2021-09-27: 10 mg via ORAL
  Filled 2021-09-27: qty 2

## 2021-09-27 MED ORDER — ONDANSETRON HCL 4 MG/2ML IJ SOLN: 4.0000 mg | Freq: Four times a day (QID) | INTRAMUSCULAR | Status: DC | PRN

## 2021-09-27 MED ORDER — HYDROCODONE-ACETAMINOPHEN 5-325 MG PO TABS
1.0000 | ORAL_TABLET | ORAL | Status: DC | PRN
  Filled 2021-09-27: qty 2

## 2021-09-27 MED ORDER — LIDOCAINE HCL (PF) 1 % IJ SOLN
5.0000 mL | Freq: Once | INTRAMUSCULAR | Status: AC
  Administered 2021-09-27: 5 mL via INTRADERMAL

## 2021-09-27 MED ORDER — IOHEXOL 300 MG/ML  SOLN
10.0000 mL | Freq: Once | INTRAMUSCULAR | Status: AC | PRN
  Administered 2021-09-27: 5 mL via INTRATHECAL

## 2021-09-27 NOTE — Procedures (Signed)
Michaela Raymond is a 66 year old individual who has had surgery to decompress L2-L4 subsequently she had adjacent level disease and underwent fusion 2 levels above.  After this she had significant pain and ultimately a spinal cord stimulator trial was performed she developed some acute paralysis of her lower extremities after that and required extensive physical therapy to regain the ability to walk.  Patient currently walks with a significant spasticity.  An MRI demonstrated presence of a lesion at the T9-T10 level in the presence of some hardware in the spinal canal across the thoracolumbar level a myelogram is now being performed to better evaluate this process and consideration of any surgical intervention that might help the patient. ? 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  Further evaluation with CT scanning of the specific areas of the cervical thoracic and the lumbar spine to identify pathoanatomy. ?

## 2021-09-27 NOTE — Progress Notes (Signed)
Patient was given discharge instructions. She verbalized understanding. 

## 2021-11-10 ENCOUNTER — Other Ambulatory Visit: Payer: Self-pay | Admitting: Internal Medicine

## 2021-11-10 DIAGNOSIS — E039 Hypothyroidism, unspecified: Secondary | ICD-10-CM

## 2021-11-24 ENCOUNTER — Other Ambulatory Visit (HOSPITAL_COMMUNITY): Payer: Self-pay | Admitting: *Deleted

## 2021-11-27 ENCOUNTER — Ambulatory Visit (HOSPITAL_COMMUNITY)
Admission: RE | Admit: 2021-11-27 | Discharge: 2021-11-27 | Disposition: A | Discharge status: Home / Self Care | Payer: Medicare Other | Source: Ambulatory Visit | Attending: Internal Medicine | Admitting: Internal Medicine

## 2021-11-27 DIAGNOSIS — M810 Age-related osteoporosis without current pathological fracture: Secondary | ICD-10-CM | POA: Insufficient documentation

## 2021-11-27 DIAGNOSIS — D649 Anemia, unspecified: Secondary | ICD-10-CM | POA: Insufficient documentation

## 2021-11-27 MED ORDER — DENOSUMAB 60 MG/ML ~~LOC~~ SOSY
PREFILLED_SYRINGE | SUBCUTANEOUS | Status: AC
  Administered 2021-11-27: 60 mg via SUBCUTANEOUS
  Filled 2021-11-27: qty 1

## 2021-11-27 MED ORDER — DENOSUMAB 60 MG/ML ~~LOC~~ SOSY
60.0000 mg | PREFILLED_SYRINGE | Freq: Once | SUBCUTANEOUS | Status: AC
Start: 2021-11-27 — End: 2021-11-27

## 2021-11-27 MED ORDER — SODIUM CHLORIDE 0.9 % IV SOLN
510.0000 mg | Freq: Once | INTRAVENOUS | Status: AC
  Administered 2021-11-27: 510 mg via INTRAVENOUS
  Filled 2021-11-27: qty 17

## 2021-11-28 ENCOUNTER — Ambulatory Visit
Admission: RE | Admit: 2021-11-28 | Discharge: 2021-11-28 | Disposition: A | Discharge status: Home / Self Care | Payer: Medicare Other | Source: Ambulatory Visit | Attending: Internal Medicine | Admitting: Internal Medicine

## 2021-11-28 DIAGNOSIS — E039 Hypothyroidism, unspecified: Secondary | ICD-10-CM

## 2022-04-05 ENCOUNTER — Encounter (HOSPITAL_COMMUNITY): Payer: Self-pay | Admitting: Emergency Medicine

## 2022-04-05 ENCOUNTER — Emergency Department (HOSPITAL_COMMUNITY): Payer: Medicare Other

## 2022-04-05 ENCOUNTER — Emergency Department (HOSPITAL_COMMUNITY)
Admission: EM | Admit: 2022-04-05 | Discharge: 2022-04-05 | Discharge status: Against Medical Advice | Payer: Medicare Other | Attending: Student | Admitting: Student

## 2022-04-05 DIAGNOSIS — Z5321 Procedure and treatment not carried out due to patient leaving prior to being seen by health care provider: Secondary | ICD-10-CM | POA: Diagnosis not present

## 2022-04-05 DIAGNOSIS — M545 Low back pain, unspecified: Secondary | ICD-10-CM | POA: Diagnosis present

## 2022-04-05 NOTE — ED Provider Triage Note (Signed)
Emergency Medicine Provider Triage Evaluation Note  Michaela Raymond , a 66 y.o. female  was evaluated in triage.  Pt complains of mid and low back pain secondary to a fall that occurred last week.  Patient states she has radicular pain going down both legs all the way down to the level of the feet.  She that she has a history of spinal issues with a plan for possible revision surgery with Dr. Ellene Route.  She has an appointment scheduled for October 25.  She states that she is on chronic pain management and the pain has become increasingly severe..  Pain management recommend she come to the emergency department for evaluation  Review of Systems  Positive: As above Negative: Incontinence, urinary retention, saddle anesthesia  Physical Exam  BP 131/74 (BP Location: Right Arm)   Pulse 79   Temp 99.1 F (37.3 C) (Oral)   Resp 18   Ht '5\' 3"'$  (1.6 m)   Wt 59 kg   SpO2 95%   BMI 23.04 kg/m  Gen:   Awake, no distress   Resp:  Normal effort  MSK:   Moves extremities without difficulty  Other:    Medical Decision Making  Medically screening exam initiated at 3:38 PM.  Appropriate orders placed.  AMEKA KRIGBAUM was informed that the remainder of the evaluation will be completed by another provider, this initial triage assessment does not replace that evaluation, and the importance of remaining in the ED until their evaluation is complete.     Dorothyann Peng, PA-C 04/05/22 1539

## 2022-04-05 NOTE — ED Notes (Signed)
Pt stated that she was not waiting anymore and she was going home. Pt seen leaving the ED

## 2022-04-05 NOTE — ED Triage Notes (Signed)
Pt here for severe back pain and a fall a week ago. States she was able to catch herself when she fell backwards. Pt ambulatory to triage with walker. Pt reports a hx of screws not placed right in her spine.

## 2022-04-19 ENCOUNTER — Other Ambulatory Visit: Payer: Self-pay | Admitting: Neurological Surgery

## 2022-04-23 ENCOUNTER — Encounter (HOSPITAL_COMMUNITY): Payer: Self-pay | Admitting: Neurological Surgery

## 2022-04-23 ENCOUNTER — Other Ambulatory Visit: Payer: Self-pay

## 2022-04-23 NOTE — Progress Notes (Signed)
Michaela Raymond denies chest pain or shortness of breath. Patient denies having any s/s of Covid in her household, also denies any known exposure to Covid.   Michaela Raymond's PCP is Dr. Sueanne Margarita.  Michaela Raymond goes to a pain clinic, patient is on Subutex, last dose was 04/20/22.  Patient is now taking tylenol and Percocet, Michaela Raymond said that she's  makes sure to not take over 4000 mg of Acetaminophen in 24 hours. Michaela Raymond reports that her biggest concern is that while she is admitted that pain control is not managed well.

## 2022-04-24 ENCOUNTER — Encounter (HOSPITAL_COMMUNITY): Payer: Self-pay | Admitting: Neurological Surgery

## 2022-04-24 ENCOUNTER — Inpatient Hospital Stay (HOSPITAL_BASED_OUTPATIENT_CLINIC_OR_DEPARTMENT_OTHER): Payer: Medicare Other | Admitting: Certified Registered Nurse Anesthetist

## 2022-04-24 ENCOUNTER — Inpatient Hospital Stay (HOSPITAL_COMMUNITY): Payer: Medicare Other

## 2022-04-24 ENCOUNTER — Other Ambulatory Visit: Payer: Self-pay

## 2022-04-24 ENCOUNTER — Inpatient Hospital Stay (HOSPITAL_COMMUNITY)
Admission: RE | Admit: 2022-04-24 | Discharge: 2022-04-25 | DRG: 496 | Disposition: A | Discharge status: Home Health | Payer: Medicare Other | Attending: Neurological Surgery | Admitting: Neurological Surgery

## 2022-04-24 ENCOUNTER — Inpatient Hospital Stay (HOSPITAL_COMMUNITY): Payer: Medicare Other | Admitting: Certified Registered Nurse Anesthetist

## 2022-04-24 ENCOUNTER — Encounter (HOSPITAL_COMMUNITY)
Admission: RE | Disposition: A | Discharge status: Home Health | Payer: Self-pay | Source: Home / Self Care | Attending: Neurological Surgery

## 2022-04-24 DIAGNOSIS — Z96653 Presence of artificial knee joint, bilateral: Secondary | ICD-10-CM | POA: Diagnosis present

## 2022-04-24 DIAGNOSIS — R011 Cardiac murmur, unspecified: Secondary | ICD-10-CM | POA: Diagnosis present

## 2022-04-24 DIAGNOSIS — Z79899 Other long term (current) drug therapy: Secondary | ICD-10-CM

## 2022-04-24 DIAGNOSIS — J449 Chronic obstructive pulmonary disease, unspecified: Secondary | ICD-10-CM | POA: Diagnosis present

## 2022-04-24 DIAGNOSIS — Z886 Allergy status to analgesic agent status: Secondary | ICD-10-CM

## 2022-04-24 DIAGNOSIS — Z87442 Personal history of urinary calculi: Secondary | ICD-10-CM

## 2022-04-24 DIAGNOSIS — Z825 Family history of asthma and other chronic lower respiratory diseases: Secondary | ICD-10-CM

## 2022-04-24 DIAGNOSIS — T84226A Displacement of internal fixation device of vertebrae, initial encounter: Secondary | ICD-10-CM | POA: Diagnosis present

## 2022-04-24 DIAGNOSIS — Z8261 Family history of arthritis: Secondary | ICD-10-CM

## 2022-04-24 DIAGNOSIS — Z79890 Hormone replacement therapy (postmenopausal): Secondary | ICD-10-CM

## 2022-04-24 DIAGNOSIS — K219 Gastro-esophageal reflux disease without esophagitis: Secondary | ICD-10-CM | POA: Diagnosis present

## 2022-04-24 DIAGNOSIS — Z9884 Bariatric surgery status: Secondary | ICD-10-CM

## 2022-04-24 DIAGNOSIS — G992 Myelopathy in diseases classified elsewhere: Secondary | ICD-10-CM | POA: Diagnosis present

## 2022-04-24 DIAGNOSIS — M960 Pseudarthrosis after fusion or arthrodesis: Principal | ICD-10-CM | POA: Diagnosis present

## 2022-04-24 DIAGNOSIS — Z7951 Long term (current) use of inhaled steroids: Secondary | ICD-10-CM

## 2022-04-24 DIAGNOSIS — Z981 Arthrodesis status: Secondary | ICD-10-CM

## 2022-04-24 DIAGNOSIS — T84296A Other mechanical complication of internal fixation device of vertebrae, initial encounter: Secondary | ICD-10-CM | POA: Diagnosis present

## 2022-04-24 DIAGNOSIS — F17210 Nicotine dependence, cigarettes, uncomplicated: Secondary | ICD-10-CM | POA: Diagnosis present

## 2022-04-24 DIAGNOSIS — R252 Cramp and spasm: Secondary | ICD-10-CM | POA: Diagnosis present

## 2022-04-24 DIAGNOSIS — Z8249 Family history of ischemic heart disease and other diseases of the circulatory system: Secondary | ICD-10-CM

## 2022-04-24 DIAGNOSIS — Z8701 Personal history of pneumonia (recurrent): Secondary | ICD-10-CM

## 2022-04-24 DIAGNOSIS — F419 Anxiety disorder, unspecified: Secondary | ICD-10-CM | POA: Diagnosis present

## 2022-04-24 DIAGNOSIS — D649 Anemia, unspecified: Secondary | ICD-10-CM | POA: Diagnosis present

## 2022-04-24 DIAGNOSIS — Z882 Allergy status to sulfonamides status: Secondary | ICD-10-CM

## 2022-04-24 DIAGNOSIS — Z888 Allergy status to other drugs, medicaments and biological substances status: Secondary | ICD-10-CM

## 2022-04-24 DIAGNOSIS — I10 Essential (primary) hypertension: Secondary | ICD-10-CM | POA: Diagnosis present

## 2022-04-24 DIAGNOSIS — Z87892 Personal history of anaphylaxis: Secondary | ICD-10-CM

## 2022-04-24 DIAGNOSIS — M549 Dorsalgia, unspecified: Secondary | ICD-10-CM | POA: Diagnosis present

## 2022-04-24 DIAGNOSIS — J45909 Unspecified asthma, uncomplicated: Secondary | ICD-10-CM | POA: Diagnosis present

## 2022-04-24 DIAGNOSIS — G959 Disease of spinal cord, unspecified: Secondary | ICD-10-CM

## 2022-04-24 DIAGNOSIS — M797 Fibromyalgia: Secondary | ICD-10-CM | POA: Diagnosis present

## 2022-04-24 DIAGNOSIS — G4733 Obstructive sleep apnea (adult) (pediatric): Secondary | ICD-10-CM | POA: Diagnosis present

## 2022-04-24 DIAGNOSIS — R531 Weakness: Secondary | ICD-10-CM | POA: Diagnosis present

## 2022-04-24 DIAGNOSIS — M542 Cervicalgia: Secondary | ICD-10-CM | POA: Diagnosis present

## 2022-04-24 DIAGNOSIS — I209 Angina pectoris, unspecified: Secondary | ICD-10-CM | POA: Diagnosis present

## 2022-04-24 DIAGNOSIS — Z7983 Long term (current) use of bisphosphonates: Secondary | ICD-10-CM

## 2022-04-24 DIAGNOSIS — F17200 Nicotine dependence, unspecified, uncomplicated: Secondary | ICD-10-CM

## 2022-04-24 DIAGNOSIS — S32009K Unspecified fracture of unspecified lumbar vertebra, subsequent encounter for fracture with nonunion: Principal | ICD-10-CM | POA: Diagnosis present

## 2022-04-24 DIAGNOSIS — K2270 Barrett's esophagus without dysplasia: Secondary | ICD-10-CM | POA: Diagnosis present

## 2022-04-24 DIAGNOSIS — Z881 Allergy status to other antibiotic agents status: Secondary | ICD-10-CM

## 2022-04-24 DIAGNOSIS — M810 Age-related osteoporosis without current pathological fracture: Secondary | ICD-10-CM | POA: Diagnosis present

## 2022-04-24 DIAGNOSIS — Z91048 Other nonmedicinal substance allergy status: Secondary | ICD-10-CM

## 2022-04-24 DIAGNOSIS — E039 Hypothyroidism, unspecified: Secondary | ICD-10-CM | POA: Diagnosis present

## 2022-04-24 DIAGNOSIS — Y838 Other surgical procedures as the cause of abnormal reaction of the patient, or of later complication, without mention of misadventure at the time of the procedure: Secondary | ICD-10-CM | POA: Diagnosis present

## 2022-04-24 DIAGNOSIS — E785 Hyperlipidemia, unspecified: Secondary | ICD-10-CM | POA: Diagnosis present

## 2022-04-24 DIAGNOSIS — F32A Depression, unspecified: Secondary | ICD-10-CM | POA: Diagnosis present

## 2022-04-24 DIAGNOSIS — Z79891 Long term (current) use of opiate analgesic: Secondary | ICD-10-CM

## 2022-04-24 HISTORY — DX: Headache, unspecified: R51.9

## 2022-04-24 HISTORY — DX: Paralytic syndrome, unspecified: G83.9

## 2022-04-24 HISTORY — DX: Personal history of urinary calculi: Z87.442

## 2022-04-24 HISTORY — PX: HARDWARE REMOVAL: SHX979

## 2022-04-24 LAB — BASIC METABOLIC PANEL
Anion gap: 8 (ref 5–15)
BUN: 15 mg/dL (ref 8–23)
CO2: 26 mmol/L (ref 22–32)
Calcium: 8.8 mg/dL — ABNORMAL LOW (ref 8.9–10.3)
Chloride: 104 mmol/L (ref 98–111)
Creatinine, Ser: 0.7 mg/dL (ref 0.44–1.00)
GFR, Estimated: >60 mL/min (ref >60)
Glucose, Bld: 104 mg/dL — ABNORMAL HIGH (ref 70–99)
Potassium: 3.8 mmol/L (ref 3.5–5.1)
Sodium: 138 mmol/L (ref 135–145)

## 2022-04-24 LAB — CBC
HCT: 40 % (ref 36.0–46.0)
Hemoglobin: 13.7 g/dL (ref 12.0–15.0)
MCH: 33 pg (ref 26.0–34.0)
MCHC: 34.3 g/dL (ref 30.0–36.0)
MCV: 96.4 fL (ref 80.0–100.0)
Platelets: 318 10*3/uL (ref 150–400)
RBC: 4.15 MIL/uL (ref 3.87–5.11)
RDW: 13.8 % (ref 11.5–15.5)
WBC: 7.2 10*3/uL (ref 4.0–10.5)
nRBC: 0 % (ref 0.0–0.2)

## 2022-04-24 LAB — TYPE AND SCREEN
ABO/RH(D): O NEG
Antibody Screen: NEGATIVE

## 2022-04-24 LAB — SURGICAL PCR SCREEN
MRSA, PCR: NEGATIVE
Staphylococcus aureus: NEGATIVE

## 2022-04-24 SURGERY — REMOVAL, HARDWARE
Anesthesia: General | Site: Back

## 2022-04-24 MED ORDER — PANTOPRAZOLE SODIUM 20 MG PO TBEC
20.0000 mg | DELAYED_RELEASE_TABLET | Freq: Two times a day (BID) | ORAL | Status: DC
  Administered 2022-04-24: 20 mg via ORAL
  Filled 2022-04-24: qty 1

## 2022-04-24 MED ORDER — MORPHINE SULFATE (PF) 2 MG/ML IV SOLN
2.0000 mg | INTRAVENOUS | Status: DC | PRN
  Administered 2022-04-25: 2 mg via INTRAVENOUS
  Filled 2022-04-24: qty 1

## 2022-04-24 MED ORDER — HYDROMORPHONE HCL 1 MG/ML IJ SOLN: 0.2500 mg | INTRAMUSCULAR | Status: DC | PRN

## 2022-04-24 MED ORDER — HYDROCHLOROTHIAZIDE 12.5 MG PO TABS
12.5000 mg | ORAL_TABLET | Freq: Every day | ORAL | Status: DC
  Administered 2022-04-24: 12.5 mg via ORAL
  Filled 2022-04-24: qty 1

## 2022-04-24 MED ORDER — KETAMINE HCL 50 MG/5ML IJ SOSY
PREFILLED_SYRINGE | INTRAMUSCULAR | Status: AC
  Filled 2022-04-24: qty 5

## 2022-04-24 MED ORDER — FLEET ENEMA 7-19 GM/118ML RE ENEM: 1.0000 | ENEMA | Freq: Once | RECTAL | Status: DC | PRN

## 2022-04-24 MED ORDER — LIDOCAINE 2% (20 MG/ML) 5 ML SYRINGE
INTRAMUSCULAR | Status: AC
  Filled 2022-04-24: qty 5

## 2022-04-24 MED ORDER — METHOCARBAMOL 1000 MG/10ML IJ SOLN: 500.0000 mg | Freq: Four times a day (QID) | INTRAVENOUS | Status: DC | PRN

## 2022-04-24 MED ORDER — DEXAMETHASONE SODIUM PHOSPHATE 10 MG/ML IJ SOLN
INTRAMUSCULAR | Status: DC | PRN
  Administered 2022-04-24: 10 mg via INTRAVENOUS

## 2022-04-24 MED ORDER — OXYCODONE HCL 5 MG PO TABS
5.0000 mg | ORAL_TABLET | Freq: Three times a day (TID) | ORAL | Status: DC | PRN
  Administered 2022-04-24: 5 mg via ORAL
  Filled 2022-04-24: qty 1

## 2022-04-24 MED ORDER — GABAPENTIN 100 MG PO CAPS
200.0000 mg | ORAL_CAPSULE | Freq: Two times a day (BID) | ORAL | Status: DC
  Administered 2022-04-24 – 2022-04-25 (×2): 200 mg via ORAL
  Filled 2022-04-24 (×2): qty 2

## 2022-04-24 MED ORDER — TIZANIDINE HCL 4 MG PO TABS
4.0000 mg | ORAL_TABLET | Freq: Every day | ORAL | Status: DC
  Administered 2022-04-24: 4 mg via ORAL
  Filled 2022-04-24: qty 1

## 2022-04-24 MED ORDER — HYDROXYZINE HCL 10 MG PO TABS: 10.0000 mg | ORAL_TABLET | Freq: Three times a day (TID) | ORAL | Status: DC | PRN

## 2022-04-24 MED ORDER — PHENOL 1.4 % MT LIQD: 1.0000 | OROMUCOSAL | Status: DC | PRN

## 2022-04-24 MED ORDER — ONDANSETRON HCL 4 MG/2ML IJ SOLN
INTRAMUSCULAR | Status: DC | PRN
  Administered 2022-04-24: 4 mg via INTRAVENOUS

## 2022-04-24 MED ORDER — GABAPENTIN 300 MG PO CAPS
600.0000 mg | ORAL_CAPSULE | Freq: Every day | ORAL | Status: DC
  Administered 2022-04-24: 600 mg via ORAL
  Filled 2022-04-24: qty 2

## 2022-04-24 MED ORDER — 0.9 % SODIUM CHLORIDE (POUR BTL) OPTIME
TOPICAL | Status: DC | PRN
  Administered 2022-04-24: 1000 mL

## 2022-04-24 MED ORDER — SENNA 8.6 MG PO TABS
1.0000 | ORAL_TABLET | Freq: Two times a day (BID) | ORAL | Status: DC
  Administered 2022-04-24 – 2022-04-25 (×2): 8.6 mg via ORAL
  Filled 2022-04-24 (×2): qty 1

## 2022-04-24 MED ORDER — LIDOCAINE 2% (20 MG/ML) 5 ML SYRINGE
INTRAMUSCULAR | Status: DC | PRN
  Administered 2022-04-24: 50 mg via INTRAVENOUS

## 2022-04-24 MED ORDER — SODIUM CHLORIDE 0.9% FLUSH: 3.0000 mL | INTRAVENOUS | Status: DC | PRN

## 2022-04-24 MED ORDER — SODIUM CHLORIDE 0.9% FLUSH: 3.0000 mL | Freq: Two times a day (BID) | INTRAVENOUS | Status: DC

## 2022-04-24 MED ORDER — LOSARTAN POTASSIUM 25 MG PO TABS
25.0000 mg | ORAL_TABLET | Freq: Every day | ORAL | Status: DC
  Administered 2022-04-24: 25 mg via ORAL
  Filled 2022-04-24 (×2): qty 1

## 2022-04-24 MED ORDER — LORATADINE 10 MG PO TABS
10.0000 mg | ORAL_TABLET | Freq: Every day | ORAL | Status: DC
  Administered 2022-04-24: 10 mg via ORAL
  Filled 2022-04-24: qty 1

## 2022-04-24 MED ORDER — OXYCODONE-ACETAMINOPHEN 5-325 MG PO TABS
1.0000 | ORAL_TABLET | ORAL | Status: DC | PRN
  Administered 2022-04-24 – 2022-04-25 (×4): 1 via ORAL
  Filled 2022-04-24 (×4): qty 1

## 2022-04-24 MED ORDER — EPHEDRINE SULFATE-NACL 50-0.9 MG/10ML-% IV SOSY
PREFILLED_SYRINGE | INTRAVENOUS | Status: DC | PRN
  Administered 2022-04-24: 5 mg via INTRAVENOUS
  Administered 2022-04-24: 10 mg via INTRAVENOUS

## 2022-04-24 MED ORDER — LIDOCAINE-EPINEPHRINE 1 %-1:100000 IJ SOLN
INTRAMUSCULAR | Status: DC | PRN
  Administered 2022-04-24: 10 mL

## 2022-04-24 MED ORDER — ESTRADIOL 0.5 MG PO TABS
0.5000 mg | ORAL_TABLET | Freq: Every day | ORAL | Status: DC
  Administered 2022-04-24: 0.5 mg via ORAL
  Filled 2022-04-24 (×2): qty 1

## 2022-04-24 MED ORDER — PHENYLEPHRINE 80 MCG/ML (10ML) SYRINGE FOR IV PUSH (FOR BLOOD PRESSURE SUPPORT)
PREFILLED_SYRINGE | INTRAVENOUS | Status: AC
  Filled 2022-04-24: qty 10

## 2022-04-24 MED ORDER — BUPRENORPHINE HCL 2 MG SL SUBL: 2.0000 mg | SUBLINGUAL_TABLET | Freq: Every day | SUBLINGUAL | Status: DC

## 2022-04-24 MED ORDER — DOCUSATE SODIUM 100 MG PO CAPS
100.0000 mg | ORAL_CAPSULE | Freq: Two times a day (BID) | ORAL | Status: DC
  Administered 2022-04-24 – 2022-04-25 (×2): 100 mg via ORAL
  Filled 2022-04-24 (×2): qty 1

## 2022-04-24 MED ORDER — SUGAMMADEX SODIUM 200 MG/2ML IV SOLN
INTRAVENOUS | Status: DC | PRN
  Administered 2022-04-24: 200 mg via INTRAVENOUS

## 2022-04-24 MED ORDER — NICOTINE 21 MG/24HR TD PT24: 21.0000 mg | MEDICATED_PATCH | Freq: Every day | TRANSDERMAL | Status: DC | PRN

## 2022-04-24 MED ORDER — BUPIVACAINE HCL (PF) 0.5 % IJ SOLN
INTRAMUSCULAR | Status: AC
  Filled 2022-04-24: qty 30

## 2022-04-24 MED ORDER — KETAMINE HCL 10 MG/ML IJ SOLN
INTRAMUSCULAR | Status: DC | PRN
  Administered 2022-04-24: 50 mg via INTRAVENOUS

## 2022-04-24 MED ORDER — CHLORHEXIDINE GLUCONATE CLOTH 2 % EX PADS: 6.0000 | MEDICATED_PAD | Freq: Once | CUTANEOUS | Status: DC

## 2022-04-24 MED ORDER — LEVOTHYROXINE SODIUM 137 MCG PO TABS
137.0000 ug | ORAL_TABLET | Freq: Every day | ORAL | Status: DC
  Administered 2022-04-25: 137 ug via ORAL
  Filled 2022-04-24: qty 1

## 2022-04-24 MED ORDER — POLYETHYLENE GLYCOL 3350 17 G PO PACK
17.0000 g | PACK | Freq: Every day | ORAL | Status: DC | PRN
  Filled 2022-04-24: qty 1

## 2022-04-24 MED ORDER — HYDROCHLOROTHIAZIDE 12.5 MG PO CAPS
12.5000 mg | ORAL_CAPSULE | Freq: Every day | ORAL | Status: DC
  Filled 2022-04-24: qty 1

## 2022-04-24 MED ORDER — OXYCODONE-ACETAMINOPHEN 5-325 MG PO TABS
1.0000 | ORAL_TABLET | Freq: Three times a day (TID) | ORAL | Status: DC | PRN
  Administered 2022-04-24: 1 via ORAL
  Filled 2022-04-24: qty 1

## 2022-04-24 MED ORDER — GABAPENTIN 300 MG PO CAPS: 300.0000 mg | ORAL_CAPSULE | Freq: Once | ORAL | Status: DC

## 2022-04-24 MED ORDER — ACETAMINOPHEN 650 MG RE SUPP: 650.0000 mg | RECTAL | Status: DC | PRN

## 2022-04-24 MED ORDER — CHLORHEXIDINE GLUCONATE 0.12 % MT SOLN: 15.0000 mL | Freq: Once | OROMUCOSAL | Status: AC

## 2022-04-24 MED ORDER — ROCURONIUM BROMIDE 10 MG/ML (PF) SYRINGE
PREFILLED_SYRINGE | INTRAVENOUS | Status: DC | PRN
  Administered 2022-04-24: 10 mg via INTRAVENOUS
  Administered 2022-04-24: 50 mg via INTRAVENOUS

## 2022-04-24 MED ORDER — FERROUS SULFATE 325 (65 FE) MG PO TABS
325.0000 mg | ORAL_TABLET | Freq: Every day | ORAL | Status: DC
  Administered 2022-04-25: 325 mg via ORAL
  Filled 2022-04-24: qty 1

## 2022-04-24 MED ORDER — PROPOFOL 10 MG/ML IV BOLUS
INTRAVENOUS | Status: DC | PRN
  Administered 2022-04-24: 90 mg via INTRAVENOUS

## 2022-04-24 MED ORDER — GABAPENTIN 100 MG PO CAPS: 200.0000 mg | ORAL_CAPSULE | Freq: Two times a day (BID) | ORAL | Status: DC

## 2022-04-24 MED ORDER — METHOCARBAMOL 500 MG PO TABS
500.0000 mg | ORAL_TABLET | Freq: Four times a day (QID) | ORAL | Status: DC | PRN
  Administered 2022-04-24 – 2022-04-25 (×3): 500 mg via ORAL
  Filled 2022-04-24 (×3): qty 1

## 2022-04-24 MED ORDER — ACETAMINOPHEN 325 MG PO TABS: 650.0000 mg | ORAL_TABLET | ORAL | Status: DC | PRN

## 2022-04-24 MED ORDER — FENTANYL CITRATE (PF) 250 MCG/5ML IJ SOLN
INTRAMUSCULAR | Status: DC | PRN
  Administered 2022-04-24: 100 ug via INTRAVENOUS
  Administered 2022-04-24: 50 ug via INTRAVENOUS

## 2022-04-24 MED ORDER — CEFAZOLIN SODIUM-DEXTROSE 2-4 GM/100ML-% IV SOLN
2.0000 g | Freq: Three times a day (TID) | INTRAVENOUS | Status: AC
  Administered 2022-04-24 – 2022-04-25 (×2): 2 g via INTRAVENOUS
  Filled 2022-04-24 (×2): qty 100

## 2022-04-24 MED ORDER — OXYCODONE HCL 5 MG PO TABS
5.0000 mg | ORAL_TABLET | ORAL | Status: DC | PRN
  Administered 2022-04-24 – 2022-04-25 (×4): 5 mg via ORAL
  Filled 2022-04-24 (×4): qty 1

## 2022-04-24 MED ORDER — THROMBIN 5000 UNITS EX SOLR
CUTANEOUS | Status: AC
  Filled 2022-04-24: qty 10000

## 2022-04-24 MED ORDER — PROGESTERONE MICRONIZED 100 MG PO CAPS
100.0000 mg | ORAL_CAPSULE | Freq: Every day | ORAL | Status: DC
  Administered 2022-04-24: 100 mg via ORAL
  Filled 2022-04-24 (×2): qty 1

## 2022-04-24 MED ORDER — MENTHOL 3 MG MT LOZG: 1.0000 | LOZENGE | OROMUCOSAL | Status: DC | PRN

## 2022-04-24 MED ORDER — LACTATED RINGERS IV SOLN: INTRAVENOUS | Status: DC

## 2022-04-24 MED ORDER — ONDANSETRON HCL 4 MG/2ML IJ SOLN: 4.0000 mg | Freq: Four times a day (QID) | INTRAMUSCULAR | Status: DC | PRN

## 2022-04-24 MED ORDER — THROMBIN 5000 UNITS EX SOLR
CUTANEOUS | Status: DC | PRN
  Administered 2022-04-24 (×2): 5000 [IU] via TOPICAL

## 2022-04-24 MED ORDER — ALPRAZOLAM 0.5 MG PO TABS
0.5000 mg | ORAL_TABLET | Freq: Every day | ORAL | Status: DC
  Administered 2022-04-24: 0.5 mg via ORAL
  Filled 2022-04-24: qty 1

## 2022-04-24 MED ORDER — ACETAMINOPHEN 500 MG PO TABS: 1000.0000 mg | ORAL_TABLET | Freq: Once | ORAL | Status: DC

## 2022-04-24 MED ORDER — OXYCODONE-ACETAMINOPHEN 10-325 MG PO TABS: 1.0000 | ORAL_TABLET | Freq: Three times a day (TID) | ORAL | Status: DC | PRN

## 2022-04-24 MED ORDER — FLUTICASONE PROPIONATE 50 MCG/ACT NA SUSP
2.0000 | Freq: Every day | NASAL | Status: DC
  Filled 2022-04-24: qty 16

## 2022-04-24 MED ORDER — METHOCARBAMOL 750 MG PO TABS: 750.0000 mg | ORAL_TABLET | Freq: Three times a day (TID) | ORAL | Status: DC | PRN

## 2022-04-24 MED ORDER — SUCRALFATE 1 GM/10ML PO SUSP: 1.0000 g | Freq: Three times a day (TID) | ORAL | Status: DC | PRN

## 2022-04-24 MED ORDER — ORAL CARE MOUTH RINSE: 15.0000 mL | Freq: Once | OROMUCOSAL | Status: AC

## 2022-04-24 MED ORDER — MONTELUKAST SODIUM 10 MG PO TABS
10.0000 mg | ORAL_TABLET | Freq: Every day | ORAL | Status: DC
  Administered 2022-04-24: 10 mg via ORAL
  Filled 2022-04-24: qty 1

## 2022-04-24 MED ORDER — ALUM & MAG HYDROXIDE-SIMETH 200-200-20 MG/5ML PO SUSP: 30.0000 mL | Freq: Four times a day (QID) | ORAL | Status: DC | PRN

## 2022-04-24 MED ORDER — AMISULPRIDE (ANTIEMETIC) 5 MG/2ML IV SOLN: 10.0000 mg | Freq: Once | INTRAVENOUS | Status: DC | PRN

## 2022-04-24 MED ORDER — SODIUM CHLORIDE 0.9 % IV SOLN: 250.0000 mL | INTRAVENOUS | Status: DC

## 2022-04-24 MED ORDER — THROMBIN 5000 UNITS EX SOLR: OROMUCOSAL | Status: DC | PRN

## 2022-04-24 MED ORDER — CHLORHEXIDINE GLUCONATE 0.12 % MT SOLN
OROMUCOSAL | Status: AC
  Administered 2022-04-24: 15 mL via OROMUCOSAL
  Filled 2022-04-24: qty 15

## 2022-04-24 MED ORDER — ONDANSETRON HCL 4 MG/2ML IJ SOLN
INTRAMUSCULAR | Status: AC
  Filled 2022-04-24: qty 2

## 2022-04-24 MED ORDER — ONDANSETRON 4 MG PO TBDP: 4.0000 mg | ORAL_TABLET | Freq: Three times a day (TID) | ORAL | Status: DC | PRN

## 2022-04-24 MED ORDER — POLYETHYLENE GLYCOL 3350 17 G PO PACK
17.0000 g | PACK | Freq: Every day | ORAL | Status: DC | PRN
  Administered 2022-04-25: 17 g via ORAL

## 2022-04-24 MED ORDER — BISACODYL 10 MG RE SUPP: 10.0000 mg | Freq: Every day | RECTAL | Status: DC | PRN

## 2022-04-24 MED ORDER — ONDANSETRON HCL 4 MG PO TABS: 4.0000 mg | ORAL_TABLET | Freq: Four times a day (QID) | ORAL | Status: DC | PRN

## 2022-04-24 MED ORDER — ACETAMINOPHEN 325 MG PO TABS
1300.0000 mg | ORAL_TABLET | Freq: Two times a day (BID) | ORAL | Status: DC
  Filled 2022-04-24 (×4): qty 4

## 2022-04-24 MED ORDER — PROMETHAZINE HCL 25 MG/ML IJ SOLN: 6.2500 mg | INTRAMUSCULAR | Status: DC | PRN

## 2022-04-24 MED ORDER — MEPERIDINE HCL 25 MG/ML IJ SOLN: 6.2500 mg | INTRAMUSCULAR | Status: DC | PRN

## 2022-04-24 MED ORDER — ROCURONIUM BROMIDE 10 MG/ML (PF) SYRINGE
PREFILLED_SYRINGE | INTRAVENOUS | Status: AC
  Filled 2022-04-24: qty 10

## 2022-04-24 MED ORDER — DEXAMETHASONE SODIUM PHOSPHATE 10 MG/ML IJ SOLN
INTRAMUSCULAR | Status: AC
  Filled 2022-04-24: qty 1

## 2022-04-24 MED ORDER — THROMBIN 5000 UNITS EX SOLR
CUTANEOUS | Status: AC
  Filled 2022-04-24: qty 5000

## 2022-04-24 MED ORDER — LIDOCAINE-EPINEPHRINE 1 %-1:100000 IJ SOLN
INTRAMUSCULAR | Status: AC
  Filled 2022-04-24: qty 1

## 2022-04-24 MED ORDER — CEFAZOLIN SODIUM-DEXTROSE 2-4 GM/100ML-% IV SOLN
INTRAVENOUS | Status: AC
  Filled 2022-04-24: qty 100

## 2022-04-24 MED ORDER — PHENYLEPHRINE 80 MCG/ML (10ML) SYRINGE FOR IV PUSH (FOR BLOOD PRESSURE SUPPORT)
PREFILLED_SYRINGE | INTRAVENOUS | Status: DC | PRN
  Administered 2022-04-24 (×2): 80 ug via INTRAVENOUS

## 2022-04-24 MED ORDER — FENTANYL CITRATE (PF) 250 MCG/5ML IJ SOLN
INTRAMUSCULAR | Status: AC
  Filled 2022-04-24: qty 5

## 2022-04-24 MED ORDER — CEFAZOLIN SODIUM-DEXTROSE 2-4 GM/100ML-% IV SOLN
2.0000 g | INTRAVENOUS | Status: AC
  Administered 2022-04-24: 2 g via INTRAVENOUS

## 2022-04-24 MED ORDER — GABAPENTIN 300 MG PO CAPS: 600.0000 mg | ORAL_CAPSULE | Freq: Every day | ORAL | Status: DC

## 2022-04-24 SURGICAL SUPPLY — 46 items
APL SKNCLS STERI-STRIP NONHPOA (GAUZE/BANDAGES/DRESSINGS)
BAG COUNTER SPONGE SURGICOUNT (BAG) ×1 IMPLANT
BAG SPNG CNTER NS LX DISP (BAG) ×1
BENZOIN TINCTURE PRP APPL 2/3 (GAUZE/BANDAGES/DRESSINGS) IMPLANT
BLADE CLIPPER SURG (BLADE) IMPLANT
CANISTER SUCT 3000ML PPV (MISCELLANEOUS) ×1 IMPLANT
DRAPE C-ARM 42X72 X-RAY (DRAPES) IMPLANT
DRAPE LAPAROTOMY 100X72 PEDS (DRAPES) IMPLANT
DRAPE LAPAROTOMY 100X72X124 (DRAPES) IMPLANT
DURAPREP 26ML APPLICATOR (WOUND CARE) ×1 IMPLANT
ELECT BLADE 4.0 EZ CLEAN MEGAD (MISCELLANEOUS) ×1
ELECT REM PT RETURN 9FT ADLT (ELECTROSURGICAL) ×1
ELECTRODE BLDE 4.0 EZ CLN MEGD (MISCELLANEOUS) ×1 IMPLANT
ELECTRODE REM PT RTRN 9FT ADLT (ELECTROSURGICAL) ×1 IMPLANT
GAUZE 4X4 16PLY ~~LOC~~+RFID DBL (SPONGE) IMPLANT
GAUZE SPONGE 4X4 12PLY STRL (GAUZE/BANDAGES/DRESSINGS) ×1 IMPLANT
GLOVE BIOGEL PI IND STRL 8.5 (GLOVE) ×1 IMPLANT
GLOVE ECLIPSE 8.5 STRL (GLOVE) ×1 IMPLANT
GOWN STRL REUS W/ TWL LRG LVL3 (GOWN DISPOSABLE) ×1 IMPLANT
GOWN STRL REUS W/ TWL XL LVL3 (GOWN DISPOSABLE) ×1 IMPLANT
GOWN STRL REUS W/TWL 2XL LVL3 (GOWN DISPOSABLE) ×1 IMPLANT
GOWN STRL REUS W/TWL LRG LVL3 (GOWN DISPOSABLE) ×1
GOWN STRL REUS W/TWL XL LVL3 (GOWN DISPOSABLE) ×1
KIT BASIN OR (CUSTOM PROCEDURE TRAY) ×1 IMPLANT
KIT TURNOVER KIT B (KITS) ×1 IMPLANT
MARKER SKIN DUAL TIP RULER LAB (MISCELLANEOUS) ×1 IMPLANT
NEEDLE HYPO 22GX1.5 SAFETY (NEEDLE) ×1 IMPLANT
NS IRRIG 1000ML POUR BTL (IV SOLUTION) ×1 IMPLANT
PACK LAMINECTOMY NEURO (CUSTOM PROCEDURE TRAY) ×1 IMPLANT
PAD ARMBOARD 7.5X6 YLW CONV (MISCELLANEOUS) ×1 IMPLANT
RASP 3.0MM (RASP) IMPLANT
SPIKE FLUID TRANSFER (MISCELLANEOUS) ×1 IMPLANT
SPONGE SURGIFOAM ABS GEL SZ50 (HEMOSTASIS) ×1 IMPLANT
SPONGE T-LAP 4X18 ~~LOC~~+RFID (SPONGE) IMPLANT
STAPLER SKIN PROX WIDE 3.9 (STAPLE) IMPLANT
STRIP CLOSURE SKIN 1/2X4 (GAUZE/BANDAGES/DRESSINGS) IMPLANT
SUT VIC AB 0 CT1 18XCR BRD8 (SUTURE) ×1 IMPLANT
SUT VIC AB 0 CT1 8-18 (SUTURE) ×1
SUT VIC AB 2-0 CP2 18 (SUTURE) ×1 IMPLANT
SUT VIC AB 3-0 SH 8-18 (SUTURE) ×1 IMPLANT
SUT VIC AB 4-0 RB1 18 (SUTURE) ×1 IMPLANT
SWAB COLLECTION DEVICE MRSA (MISCELLANEOUS) IMPLANT
SWAB CULTURE ESWAB REG 1ML (MISCELLANEOUS) IMPLANT
TOWEL GREEN STERILE (TOWEL DISPOSABLE) ×1 IMPLANT
TOWEL GREEN STERILE FF (TOWEL DISPOSABLE) ×1 IMPLANT
WATER STERILE IRR 1000ML POUR (IV SOLUTION) ×1 IMPLANT

## 2022-04-24 NOTE — Anesthesia Preprocedure Evaluation (Addendum)
Anesthesia Evaluation  Patient identified by MRN, date of birth, ID band Patient awake    Reviewed: Allergy & Precautions, NPO status , Patient's Chart, lab work & pertinent test results  Airway Mallampati: II  TM Distance: >3 FB Neck ROM: Full    Dental  (+) Dental Advisory Given, Caps   Pulmonary shortness of breath, asthma , sleep apnea , pneumonia, COPD, Current Smoker   Pulmonary exam normal breath sounds clear to auscultation       Cardiovascular hypertension, Pt. on medications + angina  + Valvular Problems/Murmurs  Rhythm:Regular Rate:Normal     Neuro/Psych  Headaches PSYCHIATRIC DISORDERS Anxiety Depression     Neuromuscular disease    GI/Hepatic Neg liver ROS,GERD  ,,  Endo/Other  Hypothyroidism    Renal/GU negative Renal ROS     Musculoskeletal  (+) Arthritis ,  Fibromyalgia -  Abdominal   Peds  Hematology  (+) Blood dyscrasia, anemia   Anesthesia Other Findings   Reproductive/Obstetrics                             Anesthesia Physical Anesthesia Plan  ASA: 3  Anesthesia Plan: General   Post-op Pain Management: Tylenol PO (pre-op)*, Ketamine IV* and Gabapentin PO (pre-op)*   Induction: Intravenous  PONV Risk Score and Plan: 3 and Ondansetron, Dexamethasone, Treatment may vary due to age or medical condition and Midazolam  Airway Management Planned: Oral ETT  Additional Equipment:   Intra-op Plan:   Post-operative Plan: Extubation in OR  Informed Consent: I have reviewed the patients History and Physical, chart, labs and discussed the procedure including the risks, benefits and alternatives for the proposed anesthesia with the patient or authorized representative who has indicated his/her understanding and acceptance.     Dental advisory given  Plan Discussed with: CRNA  Anesthesia Plan Comments:         Anesthesia Quick Evaluation

## 2022-04-24 NOTE — H&P (Signed)
Michaela Raymond is an 66 y.o. female.   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 is a 66 year old individual whom I taken care of back in 2019 at that time she underwent surgical decompression and fusion from L2-L4.  She had developed some adjacent level disease and I advised that she may need further surgery because of insurance changes she was not able to see me and subsequently in 2020 and 2021 had surgery by Dr. Jackquline Bosch at [facility].  She underwent additional fixation of the L1-L2 level but had hardware placed up at T12 and L1 it appears that L1-L2 have fused but T12-L1 had never had an arthrodesis performed and it was noted that there was medial cut out of the left-sided screw at T12 with a screw clearly in the spinal canal there is also medial cut out of the L1 screw on the left side which is a bit less obvious patient has had continued problems with pain and ultimately was treated with a spinal cord stimulator attempts at placing the spinal cord stimulator yielded a transient paresis on the first attempt and the significant paralysis on the second attempt this was also done by Dr. Jackquline Bosch after the second attempt the spinal cord stimulator and all the hardware was removed.  She underwent a prolonged period of rehabilitation after which she has regained the ability to walk with a walker but has marked spasticity of her lower extremities.  Nonetheless the patient still has continued pain in her back and in her thoracic spine and weakness in both her lower extremities.  I had had the opportunity to reevaluate her in the past year and I have advised the removal of the hardware at T12-L1.  Past Medical History:  Diagnosis Date   Adenoma    of thyroid gland, benign   Anemia    hx. of with grastric bypass surgery   Angina 04/2010   Normal coronary angiogram; noncardiac symptom   Anxiety    Arthritis    osteoarthrits   Asthma     Barrett's esophagus    Bronchitis    Complication of anesthesia    woke up during endoscopy, twlight surgery with knee surgery pt. stopped breathing  and they did her under general   COPD (chronic obstructive pulmonary disease) ([place])    pt. denies   Depression    Dyspnea    only with heavy exercise   Fibromyalgia    GERD (gastroesophageal reflux disease)    Headache    Heart murmur    years ago   History of kidney stones    has 2 kidney stones now - will have stones remooved in Dec 2023.   Hyperlipidemia    Hypertension    Hypothyroidism    Morbid obesity ([place])    Status post gastric bypass surgery September 2013   Osteopenia    Osteoporosis    Paralysis ([place])    "some, left side worse than right- legs."   Pneumonia    Sleep apnea    cpap machine x 9 years    Past Surgical History:  Procedure Laterality Date   BACK SURGERY     spinal fusion   CARDIAC CATHETERIZATION  05/03/2010   No significant CAD in L coronary system, normal LV function   CARDIOVASCULAR STRESS TEST  04/15/2007   EKG negative for ischemia, no significant ischemia demonstrated.   COLONOSCOPY     DILATION AND CURETTAGE OF UTERUS  ESOPHAGOGASTRODUODENOSCOPY     JOINT REPLACEMENT     KNEE ARTHROPLASTY     bilaterally   LAPAROSCOPIC ROUX-EN-Y GASTRIC BYPASS WITH UPPER ENDOSCOPY AND REMOVAL OF LAP BAND     NASAL SINUS SURGERY     1989 and mucous cele removed   ROTATOR CUFF REPAIR     2001   SHOULDER ARTHROSCOPY WITH OPEN ROTATOR CUFF REPAIR Left 03/07/2018   Procedure: Left shoulder arthroscopy, subacromial decompression, mini open rotator cuff repair,  debridement;  Surgeon: Susa Day, MD;  Location: [facility];  Service: Orthopedics;  Laterality: Left;  90 mins   THYROIDECTOMY     TONSILLECTOMY     and adnoids removed   TRANSTHORACIC ECHOCARDIOGRAM  05/03/2021   EF 55-65%, normal    Family History  Problem Relation Age of Onset   Mental illness Sister    Hypertension Brother    Heart  murmur Brother    Arrhythmia Maternal Grandmother        Atrial Fibrillation   COPD Maternal Grandmother    Cancer Maternal Grandfather    Alzheimer's disease Paternal Grandmother    COPD Paternal Grandfather    Heart failure Paternal Grandfather    Allergies Son    Allergies Daughter    Allergies Daughter    Diabetes Father    Hypertension Father    Heart failure Father    COPD Father    Heart attack Father    Arthritis Father    Heart murmur Mother    Hypothyroidism Mother    Heart disease Mother    Social History:  reports that she has been smoking cigarettes. She started smoking about 10 years ago. She has a 48.00 pack-year smoking history. She has never used smokeless tobacco. She reports current alcohol use. She reports that she does not use drugs.  Allergies:  Allergies  Allergen Reactions   Sulfa Antibiotics Anaphylaxis, Hives, Itching and Swelling   Azithromycin Nausea And Vomiting    Severe Abdominal cramping Other reaction(s): Abdominal Pain Severe abd cramping   Fenofibrate Nausea Only   Tape Hives and Itching    Only use paper or cloth tape   Coreg Other (See Comments)    Patient doesn't remember the reaction   Ibuprofen Other (See Comments)    Weight loss surgery    Verapamil Other (See Comments)    Patient doesn't recall reaction   Asa [Aspirin] Other (See Comments)    GI upset   Escitalopram Oxalate Anxiety and Other (See Comments)    Panic attack, also    Talwin [Pentazocine Lactate] Nausea And Vomiting    Facility-Administered Medications Prior to Admission  Medication Dose Route Frequency Provider Last Rate Last Admin   methylPREDNISolone acetate (DEPO-MEDROL) injection 80 mg  80 mg Other Once Magnus Sinning, MD       Medications Prior to Admission  Medication Sig Dispense Refill   acetaminophen (TYLENOL) 650 MG CR tablet Take 1,300 mg by mouth 2 (two) times daily.     alosetron (LOTRONEX) 0.5 MG tablet Take 0.5 mg by mouth 2 (two) times  daily.     ALPRAZolam (XANAX) 0.5 MG tablet TAKE ONE TABLET BY MOUTH AT BEDTIME (Patient taking differently: Take 0.25 mg by mouth at bedtime.) 90 tablet 0   Biotin 1000 MCG tablet Take 1,000 mcg by mouth daily.     calcium citrate-vitamin D (CITRACAL+D) 315-200 MG-UNIT tablet Take 2 tablets by mouth 2 (two) times daily.      Cholecalciferol 125 MCG (5000 UT) TABS  Take 5,000 Units by mouth daily.     estradiol (ESTRACE) 0.5 MG tablet Take 0.5 mg by mouth daily.     ferrous sulfate 325 (65 FE) MG tablet Take 325 mg by mouth daily with breakfast.     fluticasone (FLONASE) 50 MCG/ACT nasal spray Place 2 sprays into both nostrils daily. PATIENT NEEDS OFFICE VISIT FOR ADDITIONAL REFILLS (Patient taking differently: Place 2 sprays into both nostrils 2 (two) times daily.) 16 g 0   gabapentin (NEURONTIN) 100 MG capsule Take 200 mg by mouth 2 (two) times daily.     gabapentin (NEURONTIN) 300 MG capsule Take 600 mg by mouth at bedtime.     hydrochlorothiazide (MICROZIDE) 12.5 MG capsule Take 12.5 mg by mouth daily.     hydrOXYzine (ATARAX) 10 MG tablet Take 10 mg by mouth every 8 (eight) hours as needed for anxiety.     levocetirizine (XYZAL) 5 MG tablet Take 5 mg by mouth at bedtime.   2   levothyroxine (SYNTHROID) 137 MCG tablet Take 137 mcg by mouth daily before breakfast.     losartan (COZAAR) 50 MG tablet Take 25 mg by mouth daily.     methocarbamol (ROBAXIN) 750 MG tablet Take 750 mg by mouth every 8 (eight) hours as needed for muscle spasms.     montelukast (SINGULAIR) 10 MG tablet Take 1 tablet (10 mg total) by mouth at bedtime. PATIENT NEEDS OFFICE VISIT FOR ADDITIONAL REFILLS - 2nd NOTICE 30 tablet 0   Multiple Vitamin (MULTIVITAMIN) tablet Take 1 tablet by mouth daily.     naproxen sodium (ALEVE) 220 MG tablet Take 440 mg by mouth 2 (two) times daily as needed (for pain or headache).      oxyCODONE-acetaminophen (PERCOCET) 10-325 MG tablet Take 1 tablet by mouth every 8 (eight) hours as needed  for pain.     pantoprazole (PROTONIX) 20 MG tablet Take 20 mg by mouth 2 (two) times daily.  11   polyethylene glycol (MIRALAX / GLYCOLAX) 17 g packet Take 17 g by mouth daily as needed for mild constipation.     progesterone (PROMETRIUM) 100 MG capsule Take 100 mg by mouth daily.     tiZANidine (ZANAFLEX) 4 MG tablet Take 4 mg by mouth at bedtime.     valACYclovir (VALTREX) 500 MG tablet Take 500 mg by mouth at bedtime.     buprenorphine (SUBUTEX) 2 MG SUBL SL tablet Place 2 mg under the tongue in the morning and at bedtime.     denosumab (PROLIA) 60 MG/ML SOSY injection Inject 60 mg into the skin every 6 (six) months.     lidocaine (LIDODERM) 5 % Place 1-2 patches onto the skin daily. Remove & Discard patch within 12 hours. PATIENT NEEDS OFFICE VISIT FOR MORE (Patient taking differently: Place 1 patch onto the skin daily as needed (pain). Remove & Discard patch within 12 hours) 30 patch 0   nicotine (NICODERM CQ - DOSED IN MG/24 HOURS) 21 mg/24hr patch Place 21 mg onto the skin daily as needed (for hospital stay).     sucralfate (CARAFATE) 1 GM/10ML suspension Take 1 g by mouth 3 (three) times daily as needed (when taking NSAIDS).      Results for orders placed or performed during the hospital encounter of 04/24/22 (from the past 48 hour(s))  Type and screen     Status: None   Collection Time: 04/24/22 12:05 PM  Result Value Ref Range   ABO/RH(D) O NEG    Antibody Screen NEG  Sample Expiration      04/27/2022,2359 Performed at [facility], [place] [address], [place], [place] [address]   Basic metabolic panel per protocol     Status: Abnormal   Collection Time: 04/24/22 12:20 PM  Result Value Ref Range   Sodium 138 135 - 145 mmol/L   Potassium 3.8 3.5 - 5.1 mmol/L   Chloride 104 98 - 111 mmol/L   CO2 26 22 - 32 mmol/L   Glucose, Bld 104 (H) 70 - 99 mg/dL    Comment: Glucose reference range applies only to samples taken after fasting for at least 8 hours.   BUN 15 8 - 23 mg/dL    Creatinine, Ser 0.70 0.44 - 1.00 mg/dL   Calcium 8.8 (L) 8.9 - 10.3 mg/dL   GFR, Estimated >60 >60 mL/min    Comment: (NOTE) Calculated using the CKD-EPI Creatinine Equation (2021)    Anion gap 8 5 - 15    Comment: Performed at [facility] [address], [place], [place] [address]  CBC per protocol     Status: None   Collection Time: 04/24/22 12:20 PM  Result Value Ref Range   WBC 7.2 4.0 - 10.5 K/uL   RBC 4.15 3.87 - 5.11 MIL/uL   Hemoglobin 13.7 12.0 - 15.0 g/dL   HCT 40.0 36.0 - 46.0 %   MCV 96.4 80.0 - 100.0 fL   MCH 33.0 26.0 - 34.0 pg   MCHC 34.3 30.0 - 36.0 g/dL   RDW 13.8 11.5 - 15.5 %   Platelets 318 150 - 400 K/uL   nRBC 0.0 0.0 - 0.2 %    Comment: Performed at [facility], [place] [address] [place], [place] [address]   No results found.  Review of Systems  Constitutional:  Positive for activity change.  Musculoskeletal:  Positive for back pain, gait problem and myalgias.  Neurological:  Positive for weakness and numbness.  All other systems reviewed and are negative.   Blood pressure 115/69, pulse 66, temperature 98.4 F (36.9 C), temperature source Oral, resp. rate 14, height '5\' 3"'$  (1.6 m), weight 53.5 kg, SpO2 97 %. Physical Exam Constitutional:      Appearance: Normal appearance. She is normal weight.  HENT:     Head: Normocephalic and atraumatic.     Right Ear: Tympanic membrane, ear canal and external ear normal.     Left Ear: Tympanic membrane, ear canal and external ear normal.     Nose: Nose normal.     Mouth/Throat:     Mouth: Mucous membranes are moist.     Pharynx: Oropharynx is clear.  Eyes:     Extraocular Movements: Extraocular movements intact.     Conjunctiva/sclera: Conjunctivae normal.     Pupils: Pupils are equal, round, and reactive to light.  Cardiovascular:     Rate and Rhythm: Normal rate and regular rhythm.     Pulses: Normal pulses.     Heart sounds: Normal heart sounds.  Pulmonary:     Effort: Pulmonary effort  is normal.     Breath sounds: Normal breath sounds.  Abdominal:     General: Abdomen is flat. Bowel sounds are normal.     Palpations: Abdomen is soft.  Musculoskeletal:     Cervical back: Normal range of motion and neck supple.  Neurological:     Mental Status: She is alert and oriented to person, place, and time.     GCS: GCS eye subscore is 4. GCS  verbal subscore is 5. GCS motor subscore is 6.     Cranial Nerves: Cranial nerves 2-12 are intact.     Sensory: Sensory deficit present.     Motor: Weakness and abnormal muscle tone present.     Gait: Gait abnormal.     Deep Tendon Reflexes: Babinski sign present on the right side. Babinski sign present on the left side.     Comments: Decrease sensation in the anterior thighs bilaterally to pin light touch and vibration.  Decreased sensation in the calves and the feet bilaterally to pin light touch and vibration  Patient has spasticity in both lower extremities with 4 out of 5 strength in iliopsoas quad tibialis anterior and gastrocs.  Psychiatric:        Mood and Affect: Mood normal.        Behavior: Behavior normal.        Thought Content: Thought content normal.        Judgment: Judgment normal.      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  Plan: Removal of hardware T12-L1  Earleen Newport, MD 04/24/2022, 1:19 PM

## 2022-04-24 NOTE — Op Note (Signed)
Date of surgery: 04/24/2022 Preoperative diagnosis: Pseudoarthrosis T12-L1 with malpositioned hardware T12-L1, myelopathy. Postoperative diagnosis: Same Procedure: Removal of hardware T12-L1 Surgeon: Kristeen Miss Anesthesia: General endotracheal Indications: Michaela Raymond is a 66 year old individual who underwent surgery in 2020 for decompression and fusion of L1-L2 and T12-L1.  She had chronic severe pain and ultimately underwent placement of a spinal cord stimulator.  When this was placed she awoke paralyzed spinal cord stimulator was removed.  She required a long rehabilitation to regain the ability to walk she does so still with spasticity.  She returned to my office for evaluation after several years and noted that she had a pseudoarthrosis at T12-L1 with malposition hardware that is the left-sided pedicle screw of T12 was in the spinal canal and there was medial cut out of the left-sided L1 screw.  I advised that she should undergo removal of the hardware in an effort to gain some potential pain relief.  Procedure patient was brought to the operating room supine on the stretcher.  After the smooth induction of general endotracheal anesthesia, she was carefully turned prone.  The back was prepped with alcohol and DuraPrep after marking the skin where the hardware lay a vertical incision was made in the skin through the marked area after infiltrating with 10 cc of lidocaine with epinephrine mixed 50-50 with half percent Marcaine.  The dissection was carried down through the lumbodorsal and the thoracodorsal fascia until the hardware was identified.  The hardware was skeletonized and after completely exposed the screws were loosened.  The rods were then removed initially between the lower construct and the upper construct where there were W connectors connecting the lower construct to 2 central rods measuring approximately 10 cm in length.  These rods were then connected to a fixation at T12-L1 through a W  connector.  On the side of one of the rods was a fibrous band that was connected to the rod the fibrous band was released from the connector and the small connector was then released.  The screw that was in the canal on the left side at T12 was noted notably loose removed from the canal without difficulty and there was no tail of spinal fluid that followed the screw.  Once the hardware was removed the soft tissues around this area were dressed as they were significantly discolored with a dark Harnisch in the soft tissues.  Once all this tourniquet was removed the thoracodorsal lumbodorsal fascia was closed with #1 Vicryl interrupted fashion 2-0 Vicryl was used in subcutaneous tissues 3-0 and 4-0 Vicryl in the subcuticular layer with a layer of Dermabond on top blood loss for the procedure was estimated 75 cc.  Patient was returned to recovery room in stable condition

## 2022-04-24 NOTE — Anesthesia Procedure Notes (Signed)
Procedure Name: Intubation Date/Time: 04/24/2022 1:45 PM  Performed by: Maude Leriche, CRNAPre-anesthesia Checklist: Patient identified, Emergency Drugs available, Suction available and Patient being monitored Patient Re-evaluated:Patient Re-evaluated prior to induction Oxygen Delivery Method: Circle system utilized Preoxygenation: Pre-oxygenation with 100% oxygen Induction Type: IV induction Ventilation: Mask ventilation without difficulty Laryngoscope Size: Mac and 3 Grade View: Grade II Tube type: Oral Tube size: 7.0 mm Number of attempts: 1 Airway Equipment and Method: Stylet and Oral airway Placement Confirmation: ETT inserted through vocal cords under direct vision, positive ETCO2 and breath sounds checked- equal and bilateral Tube secured with: Tape Dental Injury: Teeth and Oropharynx as per pre-operative assessment  Comments: K Venrick SRNA

## 2022-04-24 NOTE — Transfer of Care (Signed)
Immediate Anesthesia Transfer of Care Note  Patient: Michaela Raymond  Procedure(s) Performed: Removal of hardware, T12, L1 Area (Back)  Patient Location: PACU  Anesthesia Type:General  Level of Consciousness: awake, alert , and oriented  Airway & Oxygen Therapy: Patient Spontanous Breathing and Patient connected to face mask oxygen  Post-op Assessment: Report given to RN, Post -op Vital signs reviewed and stable, Patient moving all extremities X 4, and Patient able to stick tongue midline  Post vital signs: Reviewed  Last Vitals:  Vitals Value Taken Time  BP 127/66   Temp 97.6   Pulse 69 04/24/22 1535  Resp 11 04/24/22 1535  SpO2 99 % 04/24/22 1535  Vitals shown include unvalidated device data.  Last Pain:  Vitals:   04/24/22 1154  TempSrc:   PainSc: 7          Complications: No notable events documented.

## 2022-04-24 NOTE — Anesthesia Postprocedure Evaluation (Signed)
Anesthesia Post Note  Patient: Michaela Raymond  Procedure(s) Performed: Removal of hardware, T12, L1 Area (Back)     Patient location during evaluation: PACU Anesthesia Type: General Level of consciousness: awake and alert Pain management: pain level controlled Vital Signs Assessment: post-procedure vital signs reviewed and stable Respiratory status: spontaneous breathing, nonlabored ventilation and respiratory function stable Cardiovascular status: blood pressure returned to baseline and stable Postop Assessment: no apparent nausea or vomiting Anesthetic complications: no   No notable events documented.  Last Vitals:  Vitals:   04/24/22 1630 04/24/22 1652  BP: 120/69 132/83  Pulse: 71 78  Resp: 19 18  Temp: 36.7 C   SpO2: 96% 99%    Last Pain:  Vitals:   04/24/22 1630  TempSrc:   PainSc: 0-No pain                 Lidia Collum

## 2022-04-25 ENCOUNTER — Encounter (HOSPITAL_COMMUNITY): Payer: Self-pay | Admitting: Neurological Surgery

## 2022-04-25 DIAGNOSIS — M810 Age-related osteoporosis without current pathological fracture: Secondary | ICD-10-CM | POA: Diagnosis present

## 2022-04-25 DIAGNOSIS — Y838 Other surgical procedures as the cause of abnormal reaction of the patient, or of later complication, without mention of misadventure at the time of the procedure: Secondary | ICD-10-CM | POA: Diagnosis present

## 2022-04-25 DIAGNOSIS — E039 Hypothyroidism, unspecified: Secondary | ICD-10-CM | POA: Diagnosis present

## 2022-04-25 DIAGNOSIS — Z87442 Personal history of urinary calculi: Secondary | ICD-10-CM | POA: Diagnosis not present

## 2022-04-25 DIAGNOSIS — T84226A Displacement of internal fixation device of vertebrae, initial encounter: Secondary | ICD-10-CM | POA: Diagnosis present

## 2022-04-25 DIAGNOSIS — Z8261 Family history of arthritis: Secondary | ICD-10-CM | POA: Diagnosis not present

## 2022-04-25 DIAGNOSIS — Z96653 Presence of artificial knee joint, bilateral: Secondary | ICD-10-CM | POA: Diagnosis present

## 2022-04-25 DIAGNOSIS — E785 Hyperlipidemia, unspecified: Secondary | ICD-10-CM | POA: Diagnosis present

## 2022-04-25 DIAGNOSIS — G992 Myelopathy in diseases classified elsewhere: Secondary | ICD-10-CM | POA: Diagnosis present

## 2022-04-25 DIAGNOSIS — G4733 Obstructive sleep apnea (adult) (pediatric): Secondary | ICD-10-CM | POA: Diagnosis present

## 2022-04-25 DIAGNOSIS — M797 Fibromyalgia: Secondary | ICD-10-CM | POA: Diagnosis present

## 2022-04-25 DIAGNOSIS — F419 Anxiety disorder, unspecified: Secondary | ICD-10-CM | POA: Diagnosis present

## 2022-04-25 DIAGNOSIS — J449 Chronic obstructive pulmonary disease, unspecified: Secondary | ICD-10-CM | POA: Diagnosis present

## 2022-04-25 DIAGNOSIS — M960 Pseudarthrosis after fusion or arthrodesis: Secondary | ICD-10-CM | POA: Diagnosis present

## 2022-04-25 DIAGNOSIS — D649 Anemia, unspecified: Secondary | ICD-10-CM | POA: Diagnosis present

## 2022-04-25 DIAGNOSIS — F32A Depression, unspecified: Secondary | ICD-10-CM | POA: Diagnosis present

## 2022-04-25 DIAGNOSIS — Z9884 Bariatric surgery status: Secondary | ICD-10-CM | POA: Diagnosis not present

## 2022-04-25 DIAGNOSIS — F17210 Nicotine dependence, cigarettes, uncomplicated: Secondary | ICD-10-CM | POA: Diagnosis present

## 2022-04-25 DIAGNOSIS — K2270 Barrett's esophagus without dysplasia: Secondary | ICD-10-CM | POA: Diagnosis present

## 2022-04-25 DIAGNOSIS — Z8249 Family history of ischemic heart disease and other diseases of the circulatory system: Secondary | ICD-10-CM | POA: Diagnosis not present

## 2022-04-25 DIAGNOSIS — S32009K Unspecified fracture of unspecified lumbar vertebra, subsequent encounter for fracture with nonunion: Secondary | ICD-10-CM | POA: Diagnosis present

## 2022-04-25 DIAGNOSIS — J45909 Unspecified asthma, uncomplicated: Secondary | ICD-10-CM | POA: Diagnosis present

## 2022-04-25 DIAGNOSIS — I209 Angina pectoris, unspecified: Secondary | ICD-10-CM | POA: Diagnosis present

## 2022-04-25 DIAGNOSIS — I10 Essential (primary) hypertension: Secondary | ICD-10-CM | POA: Diagnosis present

## 2022-04-25 DIAGNOSIS — Z981 Arthrodesis status: Secondary | ICD-10-CM | POA: Diagnosis not present

## 2022-04-25 DIAGNOSIS — T84296A Other mechanical complication of internal fixation device of vertebrae, initial encounter: Secondary | ICD-10-CM | POA: Diagnosis present

## 2022-04-25 LAB — SURGICAL PATHOLOGY, GROSS ONLY (NOT ARMC)

## 2022-04-25 MED ORDER — TIZANIDINE HCL 4 MG PO TABS: 4.0000 mg | ORAL_TABLET | Freq: Three times a day (TID) | ORAL | 3 refills | Status: AC

## 2022-04-25 MED ORDER — HYDROMORPHONE HCL 2 MG PO TABS: 2.0000 mg | ORAL_TABLET | ORAL | 0 refills | Status: AC | PRN

## 2022-04-25 MED ORDER — ONDANSETRON 4 MG PO TBDP: 4.0000 mg | ORAL_TABLET | Freq: Three times a day (TID) | ORAL | 3 refills | Status: AC | PRN

## 2022-04-25 NOTE — Evaluation (Signed)
Occupational Therapy Evaluation Patient Details Name: Michaela Raymond MRN: [id number] DOB: Nov 25, 1955 Today's Date: 04/25/2022   History of Present Illness Pt is a 66 y/o female who presents 04/24/2022 for T12-L1 hardware removal per Dr. Ellene Route. PMH significant for several prior back surgeries with continued pain. She was ultimately treated with a spinal cord stimulator. First attempt at placing the spinal cord stimulator yielded a transient paresis and significant paralysis on the second attempt. She underwent a prolonged period of rehabilitation after which she has regained the ability to walk with a walker but has marked spasticity of her lower extremities. Other PMH significant for Barrett's esophagus, COPD, fibromyalgia, HTN, hypothyroidism, osteoporosis, rotator cuff repair 2001, joint replacement (unspecified).   Clinical Impression   PTA, pt was modified independent in ADL and IADL but has had several recent falls. Pt performing UB ADL with set-up and LB Adl with supervision-min guard A. Pt performing functional transfers with supervision with RW and min guard A without RW. Providing education regarding safety with transfers and fall prevention at this time. Pt educated and demonstrating use of compensatory techniques for bed mobility, grooming, LB dressing, tub/shower transfers, and toileting within precautions. Recommend discharge home with no OT follow up. Re-consult if change in status.      Recommendations for follow up therapy are one component of a multi-disciplinary discharge planning process, led by the attending physician.  Recommendations may be updated based on patient status, additional functional criteria and insurance authorization.   Follow Up Recommendations  No OT follow up    Assistance Recommended at Discharge Intermittent Supervision/Assistance  Patient can return home with the following A little help with walking and/or transfers;A little help with  bathing/dressing/bathroom;Assistance with cooking/housework;Assist for transportation;Help with stairs or ramp for entrance    Functional Status Assessment  Patient has had a recent decline in their functional status and demonstrates the ability to make significant improvements in function in a reasonable and predictable amount of time.  Equipment Recommendations  None recommended by OT (Pt reporting she does not need BSC as she has tub bench and grab bars near toilet.)    Recommendations for Other Services       Precautions / Restrictions Precautions Precautions: Fall;Back Precaution Booklet Issued: Yes (comment) Precaution Comments: Reviewed handout and precautions during ADL Required Braces or Orthoses:  (no brace needed orders) Restrictions Weight Bearing Restrictions: No      Mobility Bed Mobility               General bed mobility comments: EOB on arrival and departure. PT reviewed bed mobility, and pt with skill to verbalize technique    Transfers Overall transfer level: Needs assistance Equipment used: Rolling walker (2 wheels) Transfers: Sit to/from Stand Sit to Stand: Supervision           General transfer comment: Close supervision for safety as pt powered up to full stand. VC's for hand placement on seated surface for safety.      Balance Overall balance assessment: Needs assistance Sitting-balance support: Feet supported, No upper extremity supported Sitting balance-Leahy Scale: Fair Sitting balance - Comments: able to complete figure 4 position EOB   Standing balance support: Bilateral upper extremity supported, During functional activity, Reliant on assistive device for balance Standing balance-Leahy Scale: Poor                             ADL either performed or assessed with clinical judgement  ADL Overall ADL's : Needs assistance/impaired Eating/Feeding: Modified independent;Sitting   Grooming: Supervision/safety;Standing    Upper Body Bathing: Set up;Sitting   Lower Body Bathing: Supervison/ safety;Sit to/from stand   Upper Body Dressing : Set up;Sitting   Lower Body Dressing: Supervision/safety;Sit to/from stand   Toilet Transfer: Nature conservation officer;Ambulation;Rolling walker (2 wheels)   Toileting- Clothing Manipulation and Hygiene: Supervision/safety;Sit to/from stand Toileting - Clothing Manipulation Details (indicate cue type and reason): educated regarding posterior pericare. Simulated during session. Tub/ Shower Transfer: Tub transfer;Tub bench;Rolling walker (2 wheels);Ambulation Tub/Shower Transfer Details (indicate cue type and reason): demonstrating tub transfer with tub bench within precautions Functional mobility during ADLs: Supervision/safety;Rolling walker (2 wheels);Min guard (Min guard when not using RW)       Vision Baseline Vision/History: 1 Wears glasses Ability to See in Adequate Light: 0 Adequate Patient Visual Report: No change from baseline Vision Assessment?: No apparent visual deficits Additional Comments: WFL for tasks assessed     Perception     Praxis      Pertinent Vitals/Pain Pain Assessment Pain Assessment: Faces Faces Pain Scale: Hurts little more Pain Location: back, incision site Pain Descriptors / Indicators: Operative site guarding, Sore Pain Intervention(s): Limited activity within patient's tolerance, Monitored during session     Hand Dominance     Extremity/Trunk Assessment Upper Extremity Assessment Upper Extremity Assessment: Overall WFL for tasks assessed   Lower Extremity Assessment Lower Extremity Assessment: Defer to PT evaluation RLE Deficits / Details: More limited than LLE. Noted decreased DF, gross generalized weakness in quads, hip flexors, hamstrings. Pt reports radicular symptoms through bottom of R foot which is mildly improved since surgery. LLE Deficits / Details: Decreased strength and muscular endurance consistent with PMH    Cervical / Trunk Assessment Cervical / Trunk Assessment: Back Surgery;Kyphotic   Communication Communication Communication: No difficulties   Cognition Arousal/Alertness: Awake/alert Behavior During Therapy: WFL for tasks assessed/performed Overall Cognitive Status: Within Functional Limits for tasks assessed                                 General Comments: Recalling all spinal precautions on arrival.     General Comments  VSS. Pt reporting she has to perform in and out cath at night and OT reviewing performance within precautions.    Exercises     Shoulder Instructions      Home Living Family/patient expects to be discharged to:: Private residence Living Arrangements: Alone Available Help at Discharge: Family;Friend(s);Available PRN/intermittently;Neighbor Type of Home: House Home Access: Stairs to enter [employer] of Steps: 3 Entrance Stairs-Rails: Right;Left Home Layout: Two level;Bed/bath upstairs Alternate Level Stairs-Number of Steps: 13 Alternate Level Stairs-Rails: Right;Left Bathroom Shower/Tub: Walk-in shower;Tub/shower unit (has walk in but is using tub shower as walk in currently broken)   Bathroom Toilet: Handicapped height     Home Equipment: Conservation officer, nature (2 wheels);Cane - quad;Tub bench;Grab bars - toilet          Prior Functioning/Environment Prior Level of Function : History of Falls (last six months)             Mobility Comments: Using either the rolling walker or QC. ADLs Comments: Able to complete ADL's without assistance, increased time. Appears she was driving.        OT Problem List: Decreased strength;Decreased activity tolerance;Impaired balance (sitting and/or standing);Decreased knowledge of use of DME or AE;Decreased knowledge of precautions;Pain      OT Treatment/Interventions:  OT Goals(Current goals can be found in the care plan section) Acute Rehab OT Goals Patient Stated Goal: go  home OT Goal Formulation: With patient  OT Frequency:      Co-evaluation              AM-PAC OT "6 Clicks" Daily Activity     Outcome Measure Help from another person eating meals?: None Help from another person taking care of personal grooming?: A Little Help from another person toileting, which includes using toliet, bedpan, or urinal?: A Little Help from another person bathing (including washing, rinsing, drying)?: A Little Help from another person to put on and taking off regular upper body clothing?: A Little Help from another person to put on and taking off regular lower body clothing?: A Little 6 Click Score: 19   End of Session Equipment Utilized During Treatment: Gait belt;Rolling walker (2 wheels) Nurse Communication: Mobility status  Activity Tolerance: Patient tolerated treatment well Patient left: in bed;with call bell/phone within reach  OT Visit Diagnosis: Unsteadiness on feet (R26.81);Other abnormalities of gait and mobility (R26.89);Muscle weakness (generalized) (M62.81);History of falling (Z91.81);Pain Pain - part of body:  (back)                Time: 1610-9604 OT Time Calculation (min): 21 min Charges:  OT General Charges $OT Visit: 1 Visit OT Evaluation $OT Eval Low Complexity: 1 Low  Shanda Howells, OTR/L [facility] Acute Rehabilitation Office: [phone]   Lula Olszewski 04/25/2022, 11:22 AM

## 2022-04-25 NOTE — Discharge Instructions (Signed)
Wound Care Remove dressing in 3 days Leave incision open to air. You may shower. Do not scrub directly on incision.  Do not put any creams, lotions, or ointments on incision. Activity Walk each and every day, increasing distance each day. No lifting greater than 5 lbs.  Avoid excessive neck motion. No driving for 2 weeks; may ride as a passenger locally.  Diet Resume your normal diet.   Call Your Doctor If Any of These Occur Redness, drainage, or swelling at the wound.  Temperature greater than 101 degrees. Severe pain not relieved by pain medication. Increased difficulty swallowing. Incision starts to come apart. Follow Up Appt Call today for appointment in 2-3 weeks ([phone]) or for problems.

## 2022-04-25 NOTE — Evaluation (Signed)
Physical Therapy Evaluation  Patient Details Name: Michaela Raymond MRN: [id number] DOB: Dec 29, 1955 Today's Date: 04/25/2022  History of Present Illness  Pt is a 66 y/o female who presents 04/24/2022 for T12-L1 hardware removal per Dr. Ellene Route. PMH significant for several prior back surgeries with continued pain. She was ultimately treated with a spinal cord stimulator. First attempt at placing the spinal cord stimulator yielded a transient paresis and significant paralysis on the second attempt. She underwent a prolonged period of rehabilitation after which she has regained the ability to walk with a walker but has marked spasticity of her lower extremities. Other PMH significant for Barrett's esophagus, COPD, fibromyalgia, HTN, hypothyroidism, osteoporosis, rotator cuff repair 2001, joint replacement (unspecified).   Clinical Impression  Pt admitted with above diagnosis. At the time of PT eval, pt was able to demonstrate transfers and ambulation with gross min guard assist to supervision for safety and RW for support. Pt was educated on precautions, positioning recommendations, appropriate activity progression, and car transfer. Pt currently with functional limitations due to the deficits listed below (see PT Problem List). Pt will benefit from skilled PT to increase their independence and safety with mobility to allow discharge to the venue listed below.         Recommendations for follow up therapy are one component of a multi-disciplinary discharge planning process, led by the attending physician.  Recommendations may be updated based on patient status, additional functional criteria and insurance authorization.  Follow Up Recommendations Home health PT      Assistance Recommended at Discharge PRN  Patient can return home with the following  A little help with walking and/or transfers;A little help with bathing/dressing/bathroom;Assistance with cooking/housework;Assist for transportation;Help  with stairs or ramp for entrance    Equipment Recommendations None recommended by PT  Recommendations for Other Services       Functional Status Assessment Patient has had a recent decline in their functional status and demonstrates the ability to make significant improvements in function in a reasonable and predictable amount of time.     Precautions / Restrictions Precautions Precautions: Fall;Back Precaution Booklet Issued: Yes (comment) Precaution Comments: Reviewed handout and pt was cued for precautions during functional mobility. Required Braces or Orthoses:  (No brace needed order) Restrictions Weight Bearing Restrictions: No      Mobility  Bed Mobility Overal bed mobility: Modified Independent             General bed mobility comments: VC's for optimal log roll technique. No assist required. HOB flat and rails lowered to simulate home environment.    Transfers Overall transfer level: Needs assistance Equipment used: Rolling walker (2 wheels) Transfers: Sit to/from Stand Sit to Stand: Supervision           General transfer comment: Close supervision for safety as pt powered up to full stand. VC's for hand placement on seated surface for safety.    Ambulation/Gait Ambulation/Gait assistance: Supervision, Min guard Gait Distance (Feet): 200 Feet Assistive device: Rolling walker (2 wheels) Gait Pattern/deviations: Step-through pattern, Decreased stride length, Drifts right/left, Trunk flexed Gait velocity: Decreased Gait velocity interpretation: 1.31 - 2.62 ft/sec, indicative of limited community ambulator   General Gait Details: Pt with poor awareness of precautions and body positioning with RW. Frequent cues for improved posture, closer walker proximity, and forward gaze. Overall advancing LE's well but with noted decreased DF R>L. No overt LOB noted.  Stairs Stairs: Yes Stairs assistance: Min guard Stair Management: One rail Right, Step to pattern,  With walker Number of Stairs: 10 General stair comments: VC's for sequencing and general safety. Pt with folding "space saving" RW and carrying it up each step as she will have to do at home when she is alone. We discussed utilizing the QC at home as a lighter alternative as the walker is probably at or above the recommended weight limit for lifting.  Wheelchair Mobility    Modified Rankin (Stroke Patients Only)       Balance Overall balance assessment: Needs assistance Sitting-balance support: Feet supported, No upper extremity supported Sitting balance-Leahy Scale: Fair     Standing balance support: Bilateral upper extremity supported, During functional activity, Reliant on assistive device for balance Standing balance-Leahy Scale: Poor                               Pertinent Vitals/Pain Pain Assessment Pain Assessment: 0-10 Pain Score: 9  Pain Location: Back; incision site, R flank, RLE Pain Descriptors / Indicators: Operative site guarding, Sore Pain Intervention(s): Limited activity within patient's tolerance, Monitored during session, Repositioned    Home Living Family/patient expects to be discharged to:: Private residence Living Arrangements: Alone Available Help at Discharge: Family;Friend(s);Available PRN/intermittently;Neighbor Type of Home: House Home Access: Stairs to enter Entrance Stairs-Rails: Psychiatric nurse of Steps: 3 Alternate Level Stairs-Number of Steps: 13 Home Layout: Two level;Bed/bath upstairs Home Equipment: Conservation officer, nature (2 wheels);Cane - quad;Tub bench;Grab bars - toilet      Prior Function Prior Level of Function : History of Falls (last six months)             Mobility Comments: Using either the rolling walker or QC. ADLs Comments: Able to complete ADL's without assistance, increased time. Appears she was driving.     Hand Dominance        Extremity/Trunk Assessment   Upper Extremity  Assessment Upper Extremity Assessment: Overall WFL for tasks assessed    Lower Extremity Assessment Lower Extremity Assessment: RLE deficits/detail;LLE deficits/detail RLE Deficits / Details: More limited than LLE. Noted decreased DF, gross generalized weakness in quads, hip flexors, hamstrings. Pt reports radicular symptoms through bottom of R foot which is mildly improved since surgery. LLE Deficits / Details: Decreased strength and muscular endurance consistent with PMH    Cervical / Trunk Assessment Cervical / Trunk Assessment: Back Surgery;Kyphotic  Communication   Communication: No difficulties  Cognition Arousal/Alertness: Awake/alert Behavior During Therapy: WFL for tasks assessed/performed Overall Cognitive Status: Within Functional Limits for tasks assessed                                          General Comments      Exercises     Assessment/Plan    PT Assessment Patient needs continued PT services  PT Problem List Decreased strength;Decreased range of motion;Decreased activity tolerance;Decreased balance;Decreased mobility;Decreased knowledge of use of DME;Decreased safety awareness;Decreased knowledge of precautions;Pain       PT Treatment Interventions DME instruction;Stair training;Gait training;Functional mobility training;Therapeutic activities;Therapeutic exercise;Balance training;Patient/family education    PT Goals (Current goals can be found in the Care Plan section)  Acute Rehab PT Goals Patient Stated Goal: Home today PT Goal Formulation: With patient Time For Goal Achievement: 05/02/22 Potential to Achieve Goals: Good    Frequency Min 5X/week     Co-evaluation  AM-PAC PT "6 Clicks" Mobility  Outcome Measure Help needed turning from your back to your side while in a flat bed without using bedrails?: A Little Help needed moving from lying on your back to sitting on the side of a flat bed without using  bedrails?: A Little Help needed moving to and from a bed to a chair (including a wheelchair)?: A Little Help needed standing up from a chair using your arms (e.g., wheelchair or bedside chair)?: A Little Help needed to walk in hospital room?: A Little Help needed climbing 3-5 steps with a railing? : A Little 6 Click Score: 18    End of Session Equipment Utilized During Treatment: Gait belt Activity Tolerance: Patient tolerated treatment well Patient left: in bed;with call bell/phone within reach Nurse Communication: Mobility status PT Visit Diagnosis: Unsteadiness on feet (R26.81);Pain Pain - part of body:  (back)    Time: 7741-2878 PT Time Calculation (min) (ACUTE ONLY): 27 min   Charges:   PT Evaluation $PT Eval Moderate Complexity: 1 Mod PT Treatments $Gait Training: 8-22 mins        Rolinda Roan, PT, DPT Acute Rehabilitation Services Secure Chat Preferred Office: [phone]   Thelma Comp 04/25/2022, 10:38 AM

## 2022-04-25 NOTE — Discharge Summary (Signed)
Physician Discharge Summary  Patient ID: Michaela Raymond MRN: [id number] DOB/AGE: 02-05-1956 66 y.o.  Admit date: 04/24/2022 Discharge date: 04/25/2022  Admission Diagnoses: Pseudoarthrosis T12-L1 with malplaced hardware.  Discharge Diagnoses: Pseudoarthrosis T12-L1.  Malplaced hardware.  Thoracic myelopathy.  Right neck pain. Principal Problem:   Pseudoarthrosis of lumbar spine Active Problems:   Lumbar pseudoarthrosis   Discharged Condition: good  Hospital Course: Patient was admitted to undergo surgical removal of hardware that had been malplaced and was evident of a pseudoarthrosis at the T12-L1 level.  She tolerated surgery well.  She is discharged home on Dilaudid for pain control.  Consults: None  Significant Diagnostic Studies: None  Treatments: surgery: See op note  Discharge Exam: Blood pressure 103/65, pulse (!) 52, temperature 98.6 F (37 C), temperature source Oral, resp. rate 16, height '5\' 3"'$  (1.6 m), weight 53.5 kg, SpO2 99 %. Incision clean and dry motor function is intact Station and gait reveals spasticity in the lower extremities as noted previously but is no worse than it had been.  Disposition: Discharge disposition: 01-Home or Self Care       Discharge Instructions     Call MD for:  redness, tenderness, or signs of infection (pain, swelling, redness, odor or green/yellow discharge around incision site)   Complete by: As directed    Call MD for:  severe uncontrolled pain   Complete by: As directed    Call MD for:  temperature >100.4   Complete by: As directed    Diet - low sodium heart healthy   Complete by: As directed    Discharge wound care:   Complete by: As directed    Remove honeycomb dressing after first shower.  Do not use salves or ointments on incision.  No lifting more than 10 pounds.  Mind your posture.   Incentive spirometry RT   Complete by: As directed    Increase activity slowly   Complete by: As directed       Allergies as  of 04/25/2022       Reactions   Sulfa Antibiotics Anaphylaxis, Hives, Itching, Swelling   Azithromycin Nausea And Vomiting   Severe Abdominal cramping Other reaction(s): Abdominal Pain Severe abd cramping   Fenofibrate Nausea Only   Tape Hives, Itching   Only use paper or cloth tape   Coreg Other (See Comments)   Patient doesn't remember the reaction   Ibuprofen Other (See Comments)   Weight loss surgery    Verapamil Other (See Comments)   Patient doesn't recall reaction   Asa [aspirin] Other (See Comments)   GI upset   Escitalopram Oxalate Anxiety, Other (See Comments)   Panic attack, also   Talwin [pentazocine Lactate] Nausea And Vomiting        Medication List     TAKE these medications    acetaminophen 650 MG CR tablet Commonly known as: TYLENOL Take 1,300 mg by mouth 2 (two) times daily.   alosetron 0.5 MG tablet Commonly known as: LOTRONEX Take 0.5 mg by mouth 2 (two) times daily.   ALPRAZolam 0.5 MG tablet Commonly known as: XANAX TAKE ONE TABLET BY MOUTH AT BEDTIME What changed: how much to take   Biotin 1000 MCG tablet Take 1,000 mcg by mouth daily.   buprenorphine 2 MG Subl SL tablet Commonly known as: SUBUTEX Place 2 mg under the tongue in the morning and at bedtime.   calcium citrate-vitamin D 315-200 MG-UNIT tablet Commonly known as: CITRACAL+D Take 2 tablets by mouth 2 (two) times  daily.   Cholecalciferol 125 MCG (5000 UT) Tabs Take 5,000 Units by mouth daily.   estradiol 0.5 MG tablet Commonly known as: ESTRACE Take 0.5 mg by mouth daily.   ferrous sulfate 325 (65 FE) MG tablet Take 325 mg by mouth daily with breakfast.   fluticasone 50 MCG/ACT nasal spray Commonly known as: FLONASE Place 2 sprays into both nostrils daily. PATIENT NEEDS OFFICE VISIT FOR ADDITIONAL REFILLS What changed:  when to take this additional instructions   gabapentin 100 MG capsule Commonly known as: NEURONTIN Take 200 mg by mouth 2 (two) times daily.    gabapentin 300 MG capsule Commonly known as: NEURONTIN Take 600 mg by mouth at bedtime.   hydrochlorothiazide 12.5 MG capsule Commonly known as: MICROZIDE Take 12.5 mg by mouth daily.   HYDROmorphone 2 MG tablet Commonly known as: Dilaudid Take 1 tablet (2 mg total) by mouth every 3 (three) hours as needed for up to 5 days for severe pain.   hydrOXYzine 10 MG tablet Commonly known as: ATARAX Take 10 mg by mouth every 8 (eight) hours as needed for anxiety.   levocetirizine 5 MG tablet Commonly known as: XYZAL Take 5 mg by mouth at bedtime.   levothyroxine 137 MCG tablet Commonly known as: SYNTHROID Take 137 mcg by mouth daily before breakfast.   lidocaine 5 % Commonly known as: LIDODERM Place 1-2 patches onto the skin daily. Remove & Discard patch within 12 hours. PATIENT NEEDS OFFICE VISIT FOR MORE What changed:  how much to take when to take this reasons to take this additional instructions   losartan 50 MG tablet Commonly known as: COZAAR Take 25 mg by mouth daily.   methocarbamol 750 MG tablet Commonly known as: ROBAXIN Take 750 mg by mouth every 8 (eight) hours as needed for muscle spasms.   montelukast 10 MG tablet Commonly known as: SINGULAIR Take 1 tablet (10 mg total) by mouth at bedtime. PATIENT NEEDS OFFICE VISIT FOR ADDITIONAL REFILLS - 2nd NOTICE   multivitamin tablet Take 1 tablet by mouth daily.   naproxen sodium 220 MG tablet Commonly known as: ALEVE Take 440 mg by mouth 2 (two) times daily as needed (for pain or headache).   nicotine 21 mg/24hr patch Commonly known as: NICODERM CQ - dosed in mg/24 hours Place 21 mg onto the skin daily as needed (for hospital stay).   ondansetron 4 MG disintegrating tablet Commonly known as: ZOFRAN-ODT Take 1 tablet (4 mg total) by mouth every 8 (eight) hours as needed for up to 18 days for nausea or vomiting.   oxyCODONE-acetaminophen 10-325 MG tablet Commonly known as: PERCOCET Take 1 tablet by mouth  every 8 (eight) hours as needed for pain.   pantoprazole 20 MG tablet Commonly known as: PROTONIX Take 20 mg by mouth 2 (two) times daily.   polyethylene glycol 17 g packet Commonly known as: MIRALAX / GLYCOLAX Take 17 g by mouth daily as needed for mild constipation.   progesterone 100 MG capsule Commonly known as: PROMETRIUM Take 100 mg by mouth daily.   Prolia 60 MG/ML Sosy injection Generic drug: denosumab Inject 60 mg into the skin every 6 (six) months.   sucralfate 1 GM/10ML suspension Commonly known as: CARAFATE Take 1 g by mouth 3 (three) times daily as needed (when taking NSAIDS).   tiZANidine 4 MG tablet Commonly known as: ZANAFLEX Take 4 mg by mouth at bedtime. What changed: Another medication with the same name was added. Make sure you understand how and when  to take each.   tiZANidine 4 MG tablet Commonly known as: Zanaflex Take 1 tablet (4 mg total) by mouth 3 (three) times daily. What changed: You were already taking a medication with the same name, and this prescription was added. Make sure you understand how and when to take each.   valACYclovir 500 MG tablet Commonly known as: VALTREX Take 500 mg by mouth at bedtime.               Discharge Care Instructions  (From admission, onward)           Start     Ordered   04/25/22 0000  Discharge wound care:       Comments: Remove honeycomb dressing after first shower.  Do not use salves or ointments on incision.  No lifting more than 10 pounds.  Mind your posture.   04/25/22 0914             Signed: Blanchie Dessert Artist Bloom 04/25/2022, 9:14 AM

## 2022-04-25 NOTE — Progress Notes (Signed)
Patient alert and oriented, mae's well, voiding adequate amount of urine, swallowing without difficulty, no c/o pain at time of discharge. Patient discharged home with family. Script and discharged instructions given to patient. Patient and family stated understanding of instructions given. Patient has an appointment with Dr. Elsner in 3 weeks 

## 2022-04-25 NOTE — TOC Transition Note (Signed)
Transition of Care [facility]) - CM/SW Discharge Note   Patient Details  Name: Michaela Raymond MRN: [id number] Date of Birth: [birth year]  [facility] CM/SW Contact:  Pollie Friar, RN Phone Number: 04/25/2022, 10:04 AM   Clinical Narrative:    Patient is discharging home with home health services through [place]. Information on the AVS. Pt has needed DME at home.  Pt has transportation home.    Final next level of care: [place] Barriers to Discharge: No Barriers Identified   Patient Goals and CMS Choice        Discharge Placement                       Discharge Plan and Services                          HH Arranged: PT, OT [place] Agency: [place] Date [place]: 04/25/22   Representative spoke with at [place]: [place] ([place]) Interventions     Readmission Risk Interventions     No data to display

## 2022-05-07 ENCOUNTER — Ambulatory Visit: Payer: Medicare Other | Admitting: Cardiology

## 2022-05-25 ENCOUNTER — Encounter: Payer: Self-pay | Admitting: Cardiology

## 2022-05-25 ENCOUNTER — Ambulatory Visit: Payer: Medicare Other | Attending: Cardiology | Admitting: Cardiology

## 2022-05-25 VITALS — BP 105/66 | HR 58 | Ht 63.0 in | Wt 126.4 lb

## 2022-05-25 DIAGNOSIS — E7849 Other hyperlipidemia: Secondary | ICD-10-CM | POA: Diagnosis not present

## 2022-05-25 DIAGNOSIS — Z716 Tobacco abuse counseling: Secondary | ICD-10-CM

## 2022-05-25 DIAGNOSIS — I10 Essential (primary) hypertension: Secondary | ICD-10-CM | POA: Diagnosis not present

## 2022-05-25 NOTE — Progress Notes (Signed)
Primary Care Provider: Sueanne Margarita, [place] Cardiologist: Glenetta Hew, MD Electrophysiologist: None  Clinic Note: Chief Complaint  Patient presents with   New Patient (Initial Visit)    Reestablish cardiology care.  Last seen in 2018   ===================================  ASSESSMENT/PLAN   Problem List Items Addressed This Visit       Cardiology Problems   Essential hypertension - Primary (Chronic)    BP well-controlled on low-dose HCTZ and losartan.      Relevant Orders   EKG 12-Lead (Completed)   Hyperlipidemia (Chronic)    Have not seen labs checked in several years.  Should be followed by PCP.  If not, we can check in follow-up        Other   Tobacco abuse counseling (Chronic)    She is got a lot of stressful issues going on, but stressed importance of cessation.      History of Morbid obesity - status post gastric bypass surgery (Chronic)    Significant weight loss actually since her last visit with me.  Apparently was down to borderline malnutrition stage.  Seems to doing better now.       ===================================  HPI:    Michaela Raymond is a previously morbidly obese (s/p gastric bypass) 66 y.o. female with a PMH notable for HTN, HLD, former smoker who presents today to "Re-Establish Cardiology Care" at the request of Sueanne Margarita, DO.  Michaela Raymond was last seen on September 21, 2016 -> she was trying to work on weight loss following her gastric bypass.  She was only on ARB and no longer on statin.  Was working on smoking cessation.  She has stable systolic ejection murmur from aortic valve calcification.  Recent Hospitalizations:  04/24/2022-last of multiple back surgeries after initial surgery and had a failed results.  This surgery was performed removal of hardware.  Reviewed  CV studies:    The following studies were reviewed today: (if available, images/films reviewed: From Epic Chart or Care  Everywhere) None:  Interval History:   Michaela Raymond presents here today for the first time seen her in over 5 years.  She was doing fairly well at that time, but she just wanted to reestablish care.  She has had a very difficult last several years.  She had lost quite a bit of weight after bypass surgery and was actually getting malnourished.  Subsequently, she has been having issues with her back surgery that has become a nightmare with recurrent issues and multiple redo surgeries.  She is now unable to walk upright without significant pain.  She has a significant limp.  She is said that up to about 3 years ago with her back surgery she was very active with walking all the time.  She was full of energy and did remarkably well.  However since her back surgery things a lot gone down. Also recently she has had issues with nephrolithiasis..  She is trying to stay positive overall in order to try to help her life improve.  Thankfully, she has not had any cardiac issues.  No significant palpitations or irregular heartbeats.  No syncope or near syncope except related to pain.  No chest pain or pressure at rest or exertion.  No PND orthopnea or edema.  She is down to only minimal dose of losartan and HCTZ for BP with well-controlled blood pressure.  No edema.    REVIEWED OF SYSTEMS   Review of Systems  Constitutional:  Positive for weight loss (Significant weight loss since last visit with me, but has been stable and regaining some weight after significant loss.). Negative for malaise/fatigue (Trying to stay active.).  HENT:  Negative for congestion and sinus pain.   Respiratory:  Negative for cough, shortness of breath and wheezing.   Cardiovascular:  Negative for leg swelling.  Musculoskeletal:  Positive for back pain and joint pain.       Has difficulty walking-has a significantly abnormal gait.  Limited mobility after back surgery.  Has significant limp.  Neurological:  Positive for weakness  (Legs). Negative for dizziness (More of balance issues because of her back.).  Psychiatric/Behavioral:  Negative for depression and memory loss. The patient is not nervous/anxious (Just very anxious about all of her surgeries that she said.) and does not have insomnia.     I have reviewed and (if needed) personally updated the patient's problem list, medications, allergies, past medical and surgical history, social and family history.   PAST MEDICAL HISTORY   Past Medical History:  Diagnosis Date   Adenoma    of thyroid gland, benign   Anemia    hx. of with grastric bypass surgery   Angina 04/2010   Normal coronary angiogram; noncardiac symptom   Anxiety    Arthritis    osteoarthrits   Asthma    Barrett's esophagus    Bronchitis    Complication of anesthesia    woke up during endoscopy, twlight surgery with knee surgery pt. stopped breathing  and they did her under general   COPD (chronic obstructive pulmonary disease) ([place])    pt. denies   Depression    Dyspnea    only with heavy exercise   Fibromyalgia    GERD (gastroesophageal reflux disease)    Headache    Heart murmur    years ago   History of kidney stones    has 2 kidney stones now - will have stones remooved in Dec 2023.   Hyperlipidemia    Hypertension    Hypothyroidism    Morbid obesity ([place])    Status post gastric bypass surgery September 2013   Osteopenia    Osteoporosis    Paralysis ([place])    "some, left side worse than right- legs."   Pneumonia    Sleep apnea    cpap machine x 9 years    PAST SURGICAL HISTORY   Past Surgical History:  Procedure Laterality Date   BACK SURGERY     spinal fusion   CARDIAC CATHETERIZATION  05/03/2010   No significant CAD in L coronary system, normal LV function   CARDIOVASCULAR STRESS TEST  04/15/2007   EKG negative for ischemia, no significant ischemia demonstrated.   COLONOSCOPY     DILATION AND CURETTAGE OF UTERUS     ESOPHAGOGASTRODUODENOSCOPY     HARDWARE  REMOVAL N/A 04/24/2022   Procedure: Removal of hardware, T12, L1 Area;  Surgeon: Kristeen Miss, MD;  Location: [place];  Service: Neurosurgery;  Laterality: N/A;  RM 18   JOINT REPLACEMENT     KNEE ARTHROPLASTY     bilaterally   LAPAROSCOPIC ROUX-EN-Y GASTRIC BYPASS WITH UPPER ENDOSCOPY AND REMOVAL OF LAP BAND     NASAL SINUS SURGERY     1989 and mucous cele removed   ROTATOR CUFF REPAIR     2001   SHOULDER ARTHROSCOPY WITH OPEN ROTATOR CUFF REPAIR Left 03/07/2018   Procedure: Left shoulder arthroscopy, subacromial decompression, mini open rotator cuff repair,  debridement;  Surgeon: Tonita Cong,  Dellis Filbert, MD;  Location: [facility];  Service: Orthopedics;  Laterality: Left;  90 mins   THYROIDECTOMY     TONSILLECTOMY     and adnoids removed   TRANSTHORACIC ECHOCARDIOGRAM  05/03/2021   EF 55-65%, normal    Immunization History  Administered Date(s) Administered   Influenza, Seasonal, Injecte, Preservative Fre 06/02/2012   Influenza,inj,Quad PF,6+ Mos 03/08/2018   Influenza-Unspecified 06/02/2012, 03/13/2013, 05/07/2014, 04/19/2016, 03/13/2017, 03/08/2018   Pneumococcal Polysaccharide-23 05/25/2013   Tdap 05/25/2013   Zoster, Live 12/09/2015    MEDICATIONS/ALLERGIES   Current Meds  Medication Sig   acetaminophen (TYLENOL) 650 MG CR tablet Take 1,300 mg by mouth 2 (two) times daily.   alosetron (LOTRONEX) 0.5 MG tablet Take 0.5 mg by mouth 2 (two) times daily.   ALPRAZolam (XANAX) 0.5 MG tablet TAKE ONE TABLET BY MOUTH AT BEDTIME (Patient taking differently: Take 0.25 mg by mouth at bedtime.)   Biotin 1000 MCG tablet Take 1,000 mcg by mouth daily.   buprenorphine (SUBUTEX) 2 MG SUBL SL tablet Place 2 mg under the tongue in the morning and at bedtime.   calcium citrate-vitamin D (CITRACAL+D) 315-200 MG-UNIT tablet Take 2 tablets by mouth 2 (two) times daily.    Cholecalciferol 125 MCG (5000 UT) TABS Take 5,000 Units by mouth daily.   [EXPIRED] ciprofloxacin (CIPRO) 500 MG tablet Take 500 mg by  mouth 2 (two) times daily.   denosumab (PROLIA) 60 MG/ML SOSY injection Inject 60 mg into the skin every 6 (six) months.   estradiol (ESTRACE) 0.5 MG tablet Take 0.5 mg by mouth daily.   ferrous sulfate 325 (65 FE) MG tablet Take 325 mg by mouth daily with breakfast.   fluticasone (FLONASE) 50 MCG/ACT nasal spray Place 2 sprays into both nostrils daily. PATIENT NEEDS OFFICE VISIT FOR ADDITIONAL REFILLS (Patient taking differently: Place 2 sprays into both nostrils 2 (two) times daily.)   gabapentin (NEURONTIN) 100 MG capsule Take 300 mg by mouth in the morning.   gabapentin (NEURONTIN) 300 MG capsule Take 600 mg by mouth at bedtime.   hydrochlorothiazide (MICROZIDE) 12.5 MG capsule Take 12.5 mg by mouth daily.   HYDROmorphone (DILAUDID) 2 MG tablet Take 2 mg by mouth every 6 (six) hours as needed for severe pain.   levocetirizine (XYZAL) 5 MG tablet Take 5 mg by mouth at bedtime.    levothyroxine (SYNTHROID) 137 MCG tablet Take 137 mcg by mouth daily before breakfast.   lidocaine (LIDODERM) 5 % Place 1-2 patches onto the skin daily. Remove & Discard patch within 12 hours. PATIENT NEEDS OFFICE VISIT FOR MORE (Patient taking differently: Place 1 patch onto the skin daily as needed (pain). Remove & Discard patch within 12 hours)   losartan (COZAAR) 50 MG tablet Take 25 mg by mouth daily.   montelukast (SINGULAIR) 10 MG tablet Take 1 tablet (10 mg total) by mouth at bedtime. PATIENT NEEDS OFFICE VISIT FOR ADDITIONAL REFILLS - 2nd NOTICE   Multiple Vitamin (MULTIVITAMIN) tablet Take 1 tablet by mouth daily.   pantoprazole (PROTONIX) 20 MG tablet Take 20 mg by mouth 2 (two) times daily.   progesterone (PROMETRIUM) 100 MG capsule Take 100 mg by mouth daily.   sucralfate (CARAFATE) 1 GM/10ML suspension Take 1 g by mouth 3 (three) times daily as needed (when taking NSAIDS).   [EXPIRED] tamsulosin (FLOMAX) 0.4 MG CAPS capsule Take 0.4 mg by mouth daily.   tiZANidine (ZANAFLEX) 4 MG tablet Take 4 mg by mouth  at bedtime.   valACYclovir (VALTREX) 500 MG tablet  Take 500 mg by mouth at bedtime.   Current Facility-Administered Medications for the 05/25/22 encounter (Office Visit) with Leonie Man, MD  Medication   methylPREDNISolone acetate (DEPO-MEDROL) injection 80 mg    Allergies  Allergen Reactions   Sulfa Antibiotics Anaphylaxis, Hives, Itching and Swelling   Azithromycin Nausea And Vomiting    Severe Abdominal cramping Other reaction(s): Abdominal Pain Severe abd cramping   Fenofibrate Nausea Only   Tape Hives and Itching    Only use paper or cloth tape   Coreg Other (See Comments)    Patient doesn't remember the reaction   Ibuprofen Other (See Comments)    Weight loss surgery    Verapamil Other (See Comments)    Patient doesn't recall reaction   Asa [Aspirin] Other (See Comments)    GI upset   Escitalopram Oxalate Anxiety and Other (See Comments)    Panic attack, also    Talwin [Pentazocine Lactate] Nausea And Vomiting    SOCIAL HISTORY/FAMILY HISTORY   Reviewed in Epic:  Pertinent findings:  Social History   Tobacco Use   Smoking status: Every Day    Packs/day: 1.00    Years: 48.00    Total pack years: 48.00    Types: Cigarettes    Start date: 02/19/2012   Smokeless tobacco: Never  Vaping Use   Vaping Use: Never used  Substance Use Topics   Alcohol use: Yes    Alcohol/week: 0.0 standard drinks of alcohol    Comment: occassional wine   Drug use: No   Social History   Social History Narrative   She is a separated mother of 3.  2 children from her first marriage and one from the second one.     She now has 2 grandkids one 25-year-old and 120 months.   She has 2 dogs       He she quit smoking in September 2013, but back to smoking a few cigarettes a day..  She does not drink.         OBJCTIVE -PE, EKG, labs   Wt Readings from Last 3 Encounters:  05/25/22 126 lb 6.4 oz (57.3 kg)  04/24/22 118 lb (53.5 kg)  04/05/22 130 lb 1.1 oz (59 kg)    Physical  Exam: Physical Exam Vitals reviewed.  Constitutional:      General: She is not in acute distress.    Appearance: She is ill-appearing (mild chroninc ill appearance). She is not toxic-appearing.     Comments: Significant weight loss since last visit with me.   Cardiovascular:     Rate and Rhythm: Normal rate and regular rhythm.     Pulses: Normal pulses.     Heart sounds: Murmur (1-2/6 SEM @ RUSB) heard.     No friction rub. No gallop.  Pulmonary:     Effort: Pulmonary effort is normal. No respiratory distress.     Breath sounds: Normal breath sounds. No wheezing or rales.  Abdominal:     General: Abdomen is flat. Bowel sounds are normal. There is no distension.     Palpations: Abdomen is soft.     Tenderness: There is no abdominal tenderness. There is no guarding or rebound.  Musculoskeletal:        General: No swelling. Normal range of motion.  Skin:    General: Skin is warm and dry.  Neurological:     General: No focal deficit present.     Mental Status: She is alert and oriented to person, place, and time.  Gait: Gait abnormal (Significant antalgic, abnormal gait).  Psychiatric:        Mood and Affect: Mood normal.        Behavior: Behavior normal.        Thought Content: Thought content normal.        Judgment: Judgment normal.     Adult ECG Report  Rate: 58 ;  Rhythm: normal sinus rhythm and left axis deviation, anterior, age-indeterminate. ;   Narrative Interpretation: No change since 2019  Recent Labs: No recent lipid panel.  Lab Results  Component Value Date   CREATININE 0.70 04/24/2022   BUN 15 04/24/2022   NA 138 04/24/2022   K 3.8 04/24/2022   CL 104 04/24/2022   CO2 26 04/24/2022      Latest Ref Rng & Units 04/24/2022   12:20 PM 03/08/2018    3:56 AM 03/06/2018    8:40 AM  CBC  WBC 4.0 - 10.5 K/uL 7.2  9.8  7.2   Hemoglobin 12.0 - 15.0 g/dL 13.7  11.8  12.6   Hematocrit 36.0 - 46.0 % 40.0  36.4  39.1   Platelets 150 - 400 K/uL 318  375  402       Lab Results  Component Value Date   TSH 1.716 05/02/2010    ================================================== I spent a total of 31 minutes with the patient spent in direct patient consultation.  Additional time spent with chart review  / charting (studies, outside notes, etc): 30 min => has been ~5 yrs since last visit - extensive chart review. Total Time: 61 min  Current medicines are reviewed at length with the patient today.  (+/- concerns) n  Notice: This dictation was prepared with Dragon dictation along with smart phrase technology. Any transcriptional errors that result from this process are unintentional and may not be corrected upon review.  Studies Ordered:  Orders Placed This Encounter  Procedures   EKG 12-Lead   No orders of the defined types were placed in this encounter.   Patient Instructions / Medication Changes & Studies & Tests Ordered   Patient Instructions  Medication Instructions:   No changes  *If you need a refill on your cardiac medications before your next appointment, please call your pharmacy*   Lab Work: Not needed    Testing/Procedures: Not needed   Follow-Up: At [facility], you and your health needs are our priority.  As part of our continuing mission to provide you with exceptional heart care, we have created designated Provider Care Teams.  These Care Teams include your primary Cardiologist (physician) and Advanced Practice Providers (APPs -  Physician Assistants and Nurse Practitioners) who all work together to provide you with the care you need, when you need it.     Your next appointment:   12 month(s)  The format for your next appointment:   In Person  Provider:   Glenetta Hew, MD      Leonie Man, MD, MS Glenetta Hew, M.D., M.S. Interventional Cardiologist  [facility]   [address] [place], [place]  [address] [phone]           Fax [phone]    Thank you for choosing [facility] in [place]!!

## 2022-05-25 NOTE — Patient Instructions (Addendum)

## 2022-06-06 ENCOUNTER — Other Ambulatory Visit (HOSPITAL_COMMUNITY): Payer: Self-pay | Admitting: *Deleted

## 2022-06-07 ENCOUNTER — Ambulatory Visit (HOSPITAL_COMMUNITY)
Admission: RE | Admit: 2022-06-07 | Discharge: 2022-06-07 | Disposition: A | Discharge status: Home / Self Care | Payer: Medicare Other | Source: Ambulatory Visit | Attending: Internal Medicine | Admitting: Internal Medicine

## 2022-06-07 DIAGNOSIS — M810 Age-related osteoporosis without current pathological fracture: Secondary | ICD-10-CM | POA: Insufficient documentation

## 2022-06-07 MED ORDER — DENOSUMAB 60 MG/ML ~~LOC~~ SOSY
PREFILLED_SYRINGE | SUBCUTANEOUS | Status: AC
  Administered 2022-06-07: 60 mg via SUBCUTANEOUS
  Filled 2022-06-07: qty 1

## 2022-06-07 MED ORDER — DENOSUMAB 60 MG/ML ~~LOC~~ SOSY: 60.0000 mg | PREFILLED_SYRINGE | Freq: Once | SUBCUTANEOUS | Status: AC

## 2022-06-17 ENCOUNTER — Encounter: Payer: Self-pay | Admitting: Cardiology

## 2022-06-17 NOTE — Assessment & Plan Note (Signed)
Significant weight loss actually since her last visit with me.  Apparently was down to borderline malnutrition stage.  Seems to doing better now.

## 2022-06-17 NOTE — Assessment & Plan Note (Signed)
She is got a lot of stressful issues going on, but stressed importance of cessation.

## 2022-06-17 NOTE — Assessment & Plan Note (Signed)
BP well-controlled on low-dose HCTZ and losartan.

## 2022-06-17 NOTE — Assessment & Plan Note (Signed)
Have not seen labs checked in several years.  Should be followed by PCP.  If not, we can check in follow-up

## 2022-08-17 IMAGING — RF DG MYELOGRAM 2+ REGIONS
8 of 16 series · 8 of 17 positions shown · IV contrast (omnipaque)
Comparison: Cervical spine MRI 11/08/2020.

CLINICAL DATA: Provided history: Chronic myelopathy.
TECHNIQUE: Contiguous axial images were obtained through the Cervical,
Thoracic, and Lumbar spine after the intrathecal infusion of
contrast (5 mL Omnipaque 300). Coronal and sagittal reconstructions
were obtained of the axial image sets.

[Series 2: cp_standard · 0.17mm/px · 1 of 1 slices shown (1 of 2)]
[im 1/1]
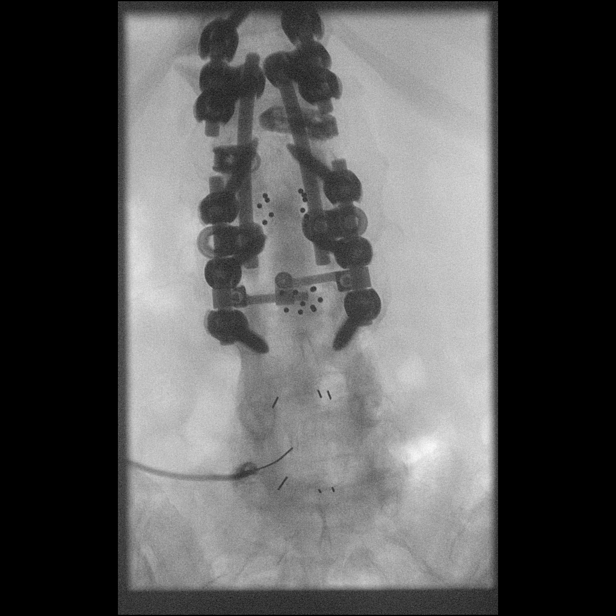

[Series 4: fluoro_myelogram_singleshot_bw · 0.17mm/px · 1 of 1 slices shown (1 of 6)]
[im 1/1]
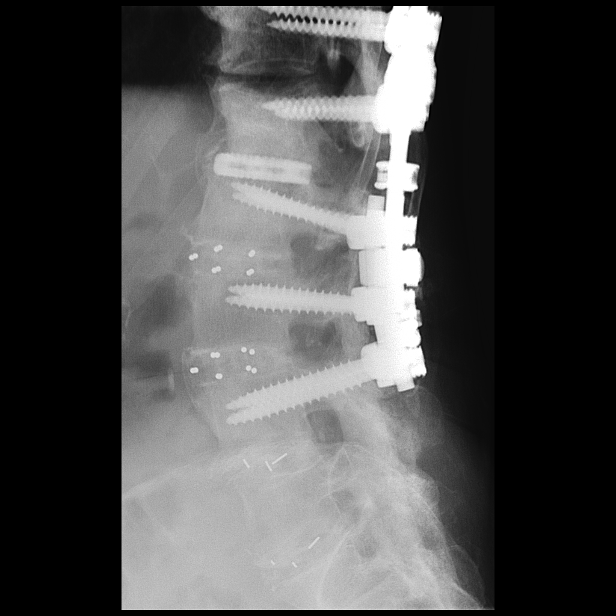

[Series 6: fluoro_myelogram_singleshot_bw · 0.18mm/px · 1 of 1 slices shown (2 of 6)]
[im 1/1]
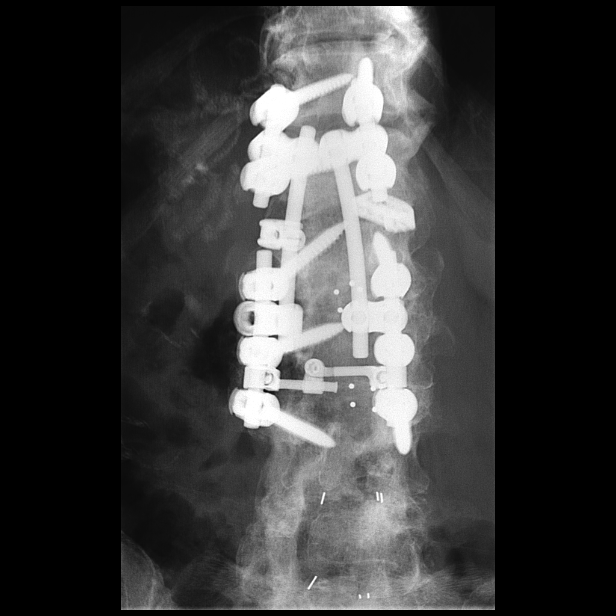

[Series 8: fluoro_myelogram_singleshot_bw · 0.18mm/px · 1 of 1 slices shown (3 of 6)]
[im 1/1]
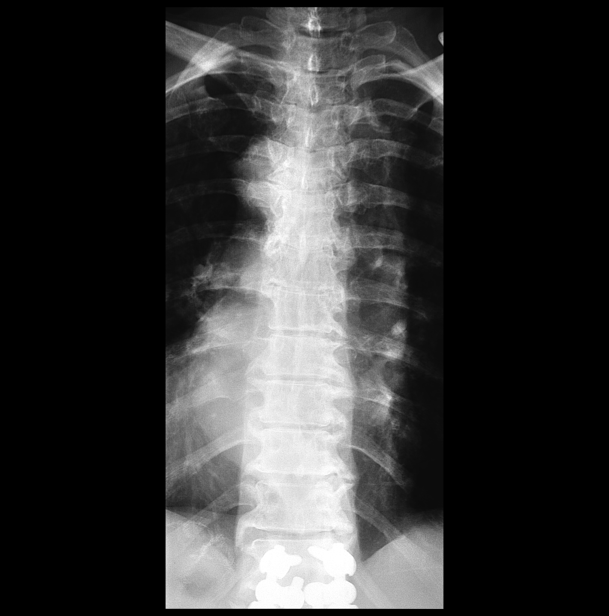

[Series 10: fluoro_myelogram_singleshot_bw · 0.18mm/px · 1 of 1 slices shown (4 of 6)]
[im 1/1]
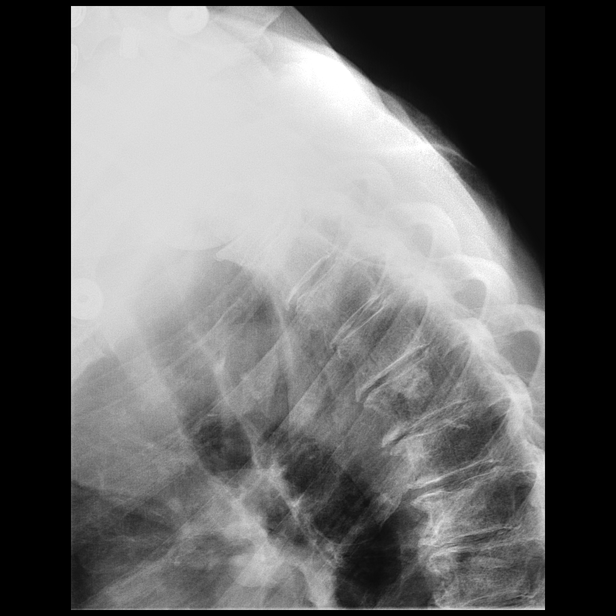

[Series 12: fluoro_myelogram_singleshot_bw · 0.18mm/px · 1 of 1 slices shown (5 of 6)]
[im 1/1]
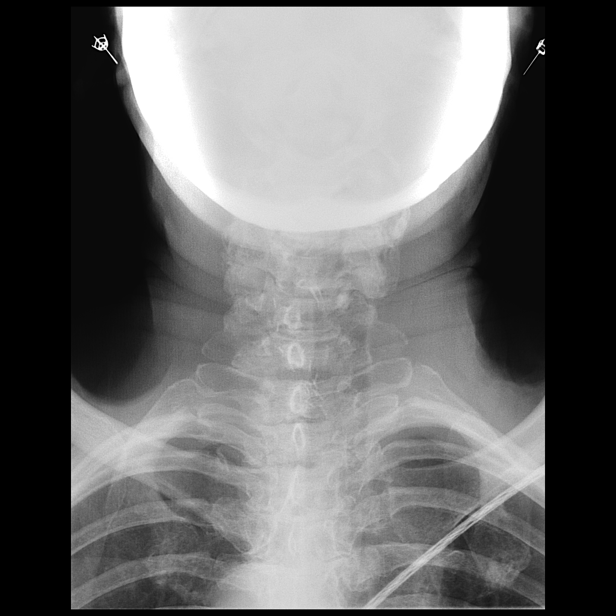

[Series 14: fluoro_myelogram_singleshot_bw · 0.18mm/px · 1 of 1 slices shown (6 of 6)]
[im 1/1]
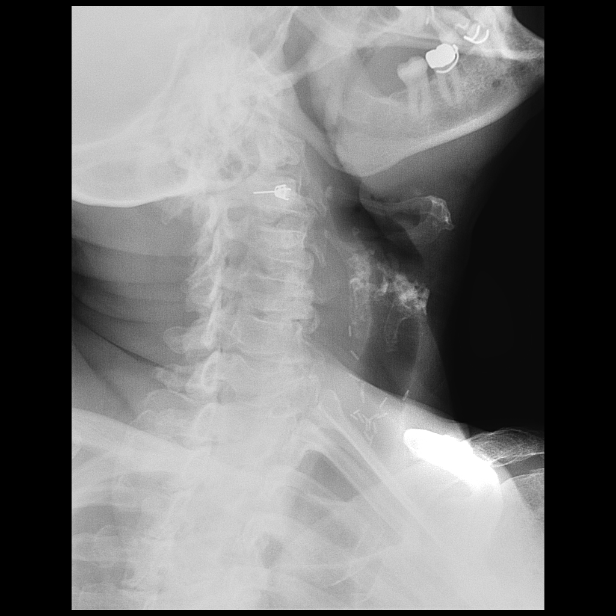

[Series 16: cp_standard · 0.37mm/px · 1 of 2 frames shown (2 of 2)]
[frame 1/2]
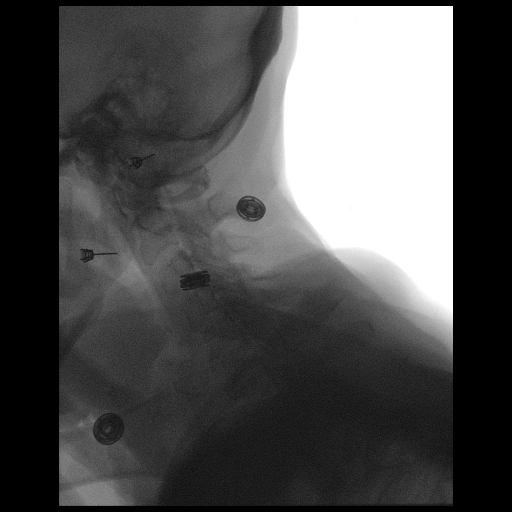

[8 of 17 positions shown; findings below may reference images not displayed]

FLUOROSCOPY:
1 minute, 24 seconds (33.90 mGy).

PROCEDURE:
LUMBAR PUNCTURE FOR CERVICAL THORACIC AND LUMBAR MYELOGRAM

CERVICAL, THORACIC AND LUMBAR MYELOGRAM

CT CERVICAL MYELOGRAM

CT THORACIC MYELOGRAM.

CT LUMBAR MYELOGRAM

Prior to the procedure, informed consent was obtained from the
patient by Dr. Giorgi Jumper. Dr. Geydarowa also performed the lumbar
puncture and intrathecal contrast injection. Subsequently, I
acquired the myelogram radiographs. The patient was then transported
to the CT department for CT imaging of the cervical, thoracic and
lumbar spine.
Thoracic spine MRI
11/08/2020. Lumbar spine MRI 11/26/2020. CT abdomen/pelvis
04/14/2021. CT abdomen/pelvis 10/18/2017.
FINDINGS: CERVICAL, THORACIC AND LUMBAR MYELOGRAM FINDINGS:

Satisfactory contrast opacification of the lumbar, thoracic and
cervical thecal sac was achieved.

Nonspecific straightening of the expected cervical lordosis. 2 mm
C3-C4 and C4-C5 grade 1 anterolisthesis. Cervical vertebral body
height is maintained.

Trace T2-T3 and T3-T4 grade 1 anterolisthesis. Thoracic vertebral
body height is maintained.

Straightening of the expected lumbar lordosis. No significant
spondylolisthesis. Lumbar vertebral body height is maintained. A
posterior spinal fusion construct spans the T12-L4 levels. Sequela
of prior fusion at L4-L5 and L5-S1, with interbody devices at these
levels.

Please see additional level by level findings as below.

CT CERVICAL MYELOGRAM FINDINGS:

Unless otherwise stated, the level by level findings below have not
appreciably changed from the prior MRI of 11/08/2020.

Alignment: Cervical levocurvature, possibly positional.
Straightening of the expected cervical lordosis. 2 mm C3-C4 and
C4-C5 grade 1 anterolisthesis.

Skull base and vertebrae: The basion-dental and atlanto-dental
intervals are maintained. No cervical spine fracture or aggressive
osseous lesion.

Soft tissues and spinal canal: Unremarkable.

Disc levels:

Unless otherwise stated, the level by level findings below have not
significantly changed from the prior MRI of 11/08/2020.

Moderate to moderately advanced disc space narrowing at C5-C6. Mild
disc space narrowing at the remaining levels.

C2-C3: Shallow disc bulge. Mild facet arthrosis. No significant
spinal canal stenosis. No significant bony neural foraminal
narrowing.

C3-C4: 2 mm grade 1 anterolisthesis. Slight disc uncovering.
Uncovertebral hypertrophy on the right. Facet arthrosis (greater on
the right). No significant spinal canal stenosis. Mild right bony
neural foraminal narrowing.

C4-C5: 2 mm grade 1 anterolisthesis. Slight disc uncovering with
shallow disc bulge. Facet arthrosis (greater on the left). No
significant spinal canal stenosis or bony neural foraminal
narrowing.

C5-C6: Disc bulge with endplate spurring and right-sided disc
osteophyte ridge/uncinate hypertrophy. Mild uncovertebral
hypertrophy also present on the left. Mild facet arthrosis. The disc
bulge effaces the ventral thecal sac, contacting and minimally
flattening the ventral aspect of the spinal cord. However, the
dorsal CSF space is maintained within the spinal canal. Bilateral
bony neural foraminal narrowing (severe right, mild left).

C6-C7: Posterior disc osteophyte complex eccentric to the left.
Uncovertebral hypertrophy on the left. The disc osteophyte complex
effaces the ventral thecal sac, contacting and minimally flattening
the ventral aspect of the spinal cord. However, the dorsal CSF space
is maintained within the spinal canal. Mild left bony neural
foraminal narrowing.

C7-T1: Minimal facet arthrosis. No significant disc herniation or
spinal canal stenosis. No significant bony neural foraminal
narrowing.

Upper chest: No consolidation within the imaged lung apices.

CT THORACIC MYELOGRAM FINDINGS:

Alignment: Trace T2-T3 and T3-T4 grade 1 anterolisthesis.

Vertebrae: Posterior spinal fusion construct at the T12-L4 levels.
Similar to the prior CT abdomen/pelvis of 04/14/2021, there is mild
lucency surrounding the bilateral T12 screws, which may reflect
hardware loosening (for instance as seen on series 16, image 37).
Indeterminate 6-7 mm round lucent lesion within the T11 spinous
process, similar as compared to the prior CT of 04/14/2021, but new
from the prior CT abdomen/pelvis of 10/18/2017. No thoracic
vertebral compression fracture. Mild multilevel degenerate endplate
irregularity. Fairly prominent multilevel ventrolateral osteophytes.

Paraspinal and other soft tissues: No airspace consolidation within
the imaged lungs. Postsurgical changes to the dorsal soft tissues
overlying the lower thoracic spine.

Disc levels:

Moderate disc space narrowing throughout the thoracic spine.

Shallow multilevel disc bulges. Multilevel facet arthrosis and
ligamentum flavum hypertrophy. Additional notable level by level
findings, as described below.

At T7-T8, a small central disc protrusion eccentric to the right
contacts and mildly flattens the ventral aspect of the spinal cord
(series 14, image 201). However, the dorsal CSF space is maintained
within the spinal canal.

At T9-T10, a disc bulge and endplate spurring resultant
mild-to-moderate spinal canal narrowing (without significant spinal
cord mass effect).

Streak and beam hardening artifact arising from spinal fusion
hardware limits evaluation of the spinal canal at the T12 level.
However, there is a medial course of the left-sided T12 screw, which
encroaches upon the left aspect of the spinal canal, contributing to
apparent moderate spinal canal stenosis and displacing the spinal
cord to the right (for instance as seen on series 5, image 126).

No more than mild relative spinal canal narrowing at the remaining
levels.

Multilevel bony neural foraminal narrowing. Most notably,
degenerative facet spurring contributes to moderate bony neural
foraminal narrowing on the left at T2-T3, bilaterally at T3-T4,
bilaterally at T4-T5, on the left at T5-T6 and on the right at
T7-T8.

There is subtle contour deformity of the posterior aspect of the
spinal cord at the T9-T10 level, corresponding with the site of the
patient's known spinal cord lesion (series 14, image 276). However,
this lesion is overall poorly reassessed by CT myelography.

CT LUMBAR MYELOGRAM FINDINGS:

Segmentation: 5 lumbar vertebrae. The caudal most disc space is
designated L5-S1.

Alignment: Straightening of the expected lumbar lordosis.

Vertebrae: A posterior spinal fusion construct spans the T12-L4
levels. Slightly medial course of the left-sided L1 screw.Prior
fusion at the L4-L5 and L5-S1 levels. Vertebral body height is
maintained. Chronic sacral fracture deformity at the S2-S3 level.

Paraspinal and other soft tissues: Large calcified fibroids.
Aortoiliac atherosclerosis. Left renal calculi measuring up to 5 mm.

Disc levels:

Unless otherwise stated, the level by level findings below have not
significantly changed from the prior MRI of 11/26/2020.

T12-L1: Disc bulge. Superimposed broad-based left foraminal disc
protrusion. Minimal relative left subarticular narrowing. Mild left
neural foraminal narrowing.

At the level of the L1 pedicle, streak and beam hardening artifact
limits evaluation of the spinal canal. However, there is a somewhat
medial course of the left-sided L1 screw, mildly encroaching upon
the left aspect of the spinal canal and contributing to apparent
mild spinal stenosis.

L1-L2: Prior posterior decompression and posterior spinal fusion.
Vertebral osteophytosis and posterior element hypertrophy. No
significant spinal canal stenosis. Moderate right bony neural
foraminal narrowing.

L2-L3: Prior posterior decompression and posterior spinal fusion. No
significant spinal canal stenosis. No significant bony neural
foraminal narrowing.

L3-L4: Prior posterior decompression and posterior spinal fusion. No
significant spinal canal stenosis. Endplate osteophytes and
posterior element hypertrophy contribute to mild right bony neural
foraminal narrowing.

L4-L5: Post laminectomy changes. Prior fusion. No significant spinal
canal stenosis or bony neural foraminal narrowing.

L5-S1: Post laminectomy changes. Prior fusion. No significant spinal
canal stenosis. Posterior element hypertrophy results in mild bony
neural foraminal narrowing bilaterally.
IMPRESSION: Cervical spine:

1. Cervical spondylosis, as outlined and having not appreciably
changed from the prior cervical spine MRI of 11/08/2020. Findings
are most notably as follows.
2. At C5-C6, there is moderate to moderately advanced disc space
narrowing. A disc bulge effaces the ventral thecal sac, contacting
and minimally flattening the ventral aspect of the spinal cord.
However, the dorsal CSF space is maintained within the spinal canal.
Multifactorial bilateral neural foraminal narrowing also present at
this level (severe right, mild left).
3. At C6-C7, there is a posterior disc osteophyte complex eccentric
to the left. The disc osteophyte complex effaces the ventral thecal
sac, contacting and minimally flattening the ventral aspect of the
spinal cord. However, the dorsal CSF space is maintained within the
spinal canal. Multifactorial mild left bony neural foraminal
narrowing also present at this level.
4. Nonspecific straightening of the expected cervical lordosis.
5. Cervical levocurvature appreciated on CT imaging only, possibly
positional.
6. 2 mm C3-C4 and C4-C5 grade 1 anterolisthesis.

Thoracic spine:

1. Posterior spinal fusion construct at the T12-L4 levels. There is
mild lucency surrounding the bilateral T12 screws suggesting
hardware loosening. Additionally, there is a medial course of the
left-sided T12 screw, which encroaches upon the left aspect of the
spinal canal contributing to apparent moderate spinal canal stenosis
and displacing the spinal cord to the right.
2. Subtle contour deformity of the posterior aspect of the spinal
cord at the T9-T10 level, corresponding with the site of the
patient's known spinal cord lesion. However, this lesion is overall
poorly reassessed by CT myelography and would be best re-evaluated
with a contrast-enhanced thoracic spine MRI.
3. Indeterminate 6-7 mm well-circumscribed lucent osseous lesion
within the T11 spinous process. This appears new from the prior CT
abdomen/pelvis of 10/18/2017. A 3-6 month follow-up non-contrast
thoracic spine CT is recommended to ensure stability.
4. Thoracic spondylosis, as outlined and with findings most notably
as follows.
5. At T7-T8, a small central disc protrusion contacts and mildly
flattens the ventral aspect of the spinal cord. However, the dorsal
CSF space is maintained within the spinal canal.
6. At T9-T10, there is multifactorial mild-to-moderate spinal canal
narrowing (without significant spinal cord mass effect).
7. Multifactorial moderate bony neural foraminal narrowing on the
left at T2-T3, bilaterally at T3-T4, bilaterally at T4-T5, on the
left at T5-T6 and on the right at T7-T8.
8. Minimal T2-T3 and T3-T4 grade 1 anterolisthesis.

Lumbar spine:

1. Extensive postoperative changes to the lumbar spine, as
described. There is a slightly medial course of the left L1 pedicle
screw, which mildly encroaches upon the left aspect of the spinal
canal, contributing to apparent mild spinal canal stenosis at this
level.
2. Superimposed lumbar spondylosis. No spinal canal stenosis at the
remaining lumbar levels. Multilevel neural foraminal narrowing,
greatest on the right at L1-L2 (moderate at this site).
3. Chronic sacral fracture deformity at the S2-S3 level.
4. Calcified fibroids.
5. Left nephrolithiasis.
6.  Aortic Atherosclerosis ([id number]).

## 2022-08-17 IMAGING — CT CT L SPINE W/ CM
2 of 4 series · 6 of 33 positions shown, 7 images · IV contrast (Omni 300)
Comparison: Cervical spine MRI 11/08/2020.

CLINICAL DATA: Provided history: Chronic myelopathy.
TECHNIQUE: Contiguous axial images were obtained through the Cervical,
Thoracic, and Lumbar spine after the intrathecal infusion of
contrast (5 mL Omnipaque 300). Coronal and sagittal reconstructions
were obtained of the axial image sets.

[Series 5: l-spine 2.0 st · axial · 0.32mm/px · z∈[-535,-279]mm · 3 of 258 slices shown, 4 images]
[im 65/258  soft-tissue]
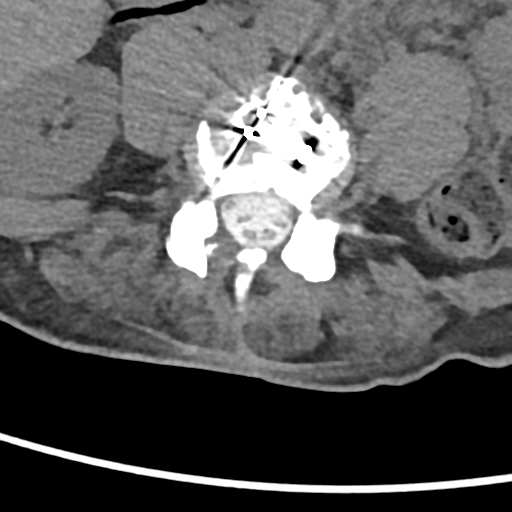
[im 65/258  bone]
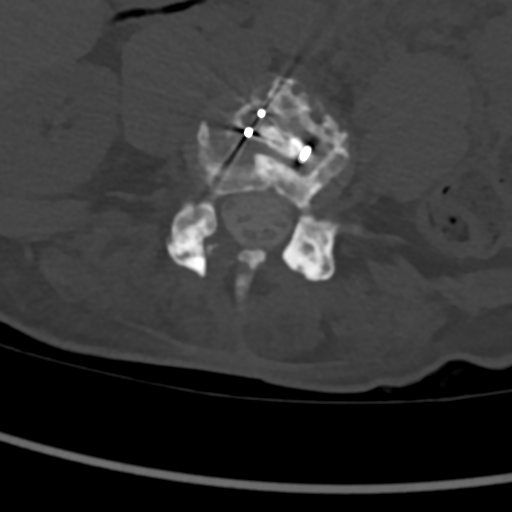
[im 129/258  bone]
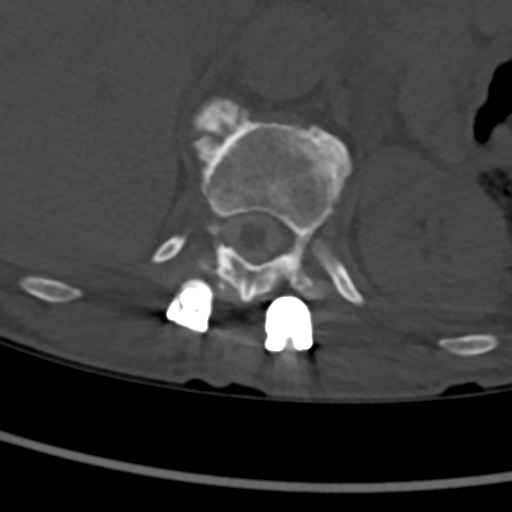
[im 193/258  bone]
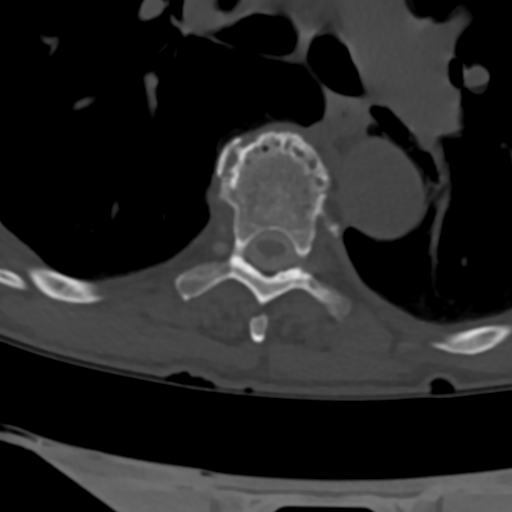

[Series 8: l-spine 2.0 cor bone · coronal · 0.23mm/px · 3 of 61 slices shown]
[im 13/61  bone]
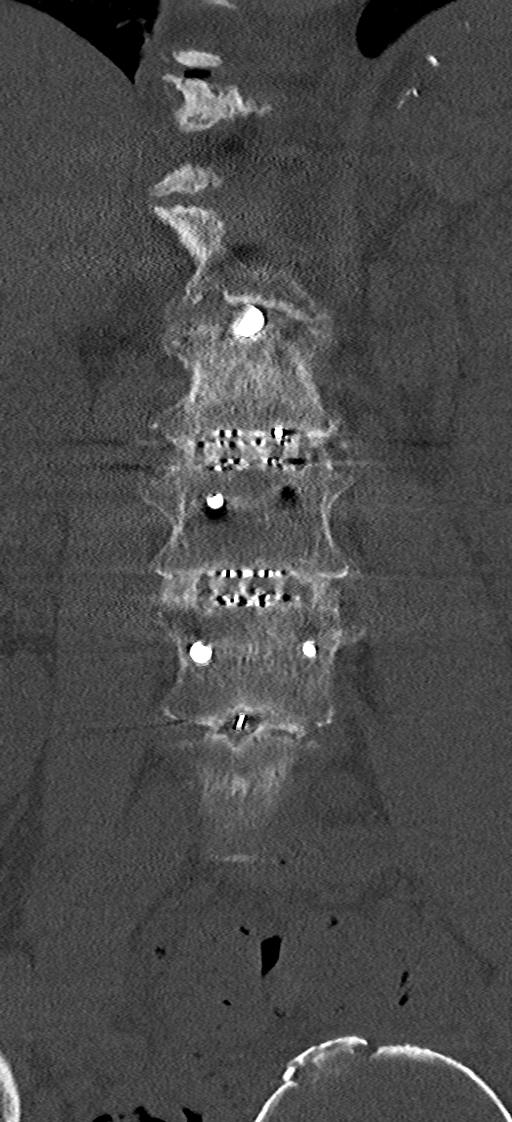
[im 25/61  bone]
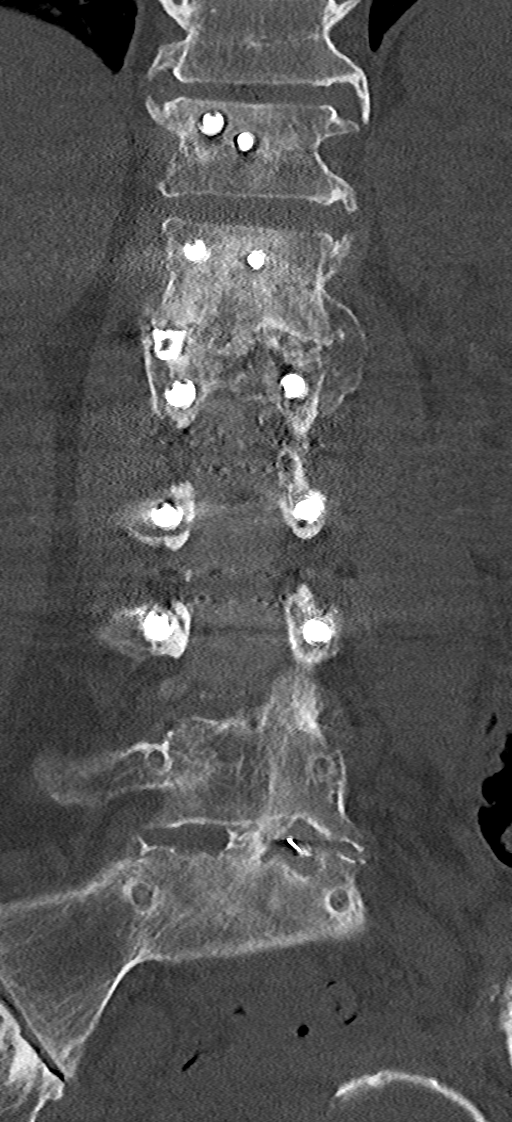
[im 37/61  bone]
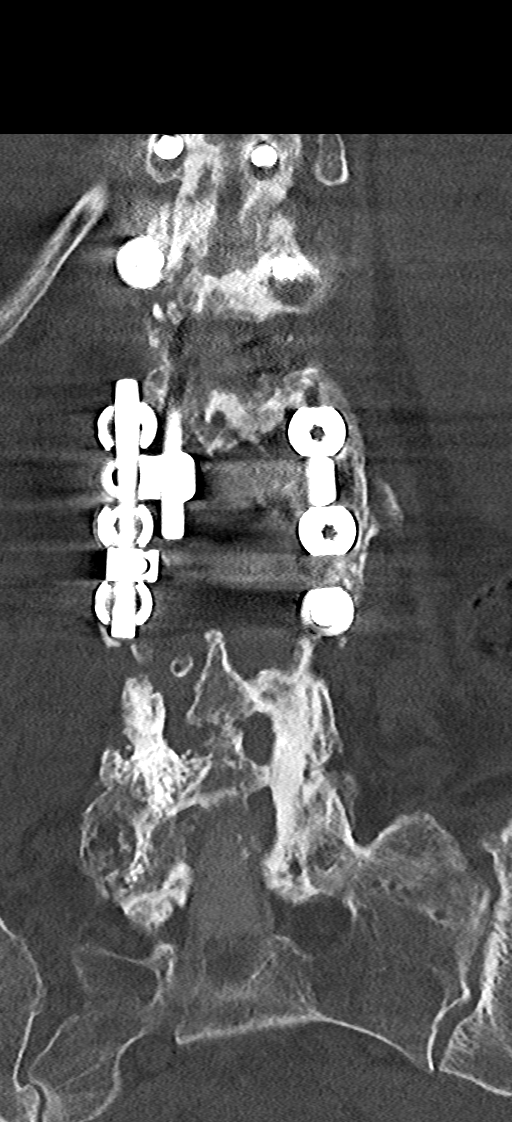

[6 of 33 positions shown; findings below may reference images not displayed]

FLUOROSCOPY:
1 minute, 24 seconds (33.90 mGy).

PROCEDURE:
LUMBAR PUNCTURE FOR CERVICAL THORACIC AND LUMBAR MYELOGRAM

CERVICAL, THORACIC AND LUMBAR MYELOGRAM

CT CERVICAL MYELOGRAM

CT THORACIC MYELOGRAM.

CT LUMBAR MYELOGRAM

Prior to the procedure, informed consent was obtained from the
patient by Dr. Giorgi Jumper. Dr. Geydarowa also performed the lumbar
puncture and intrathecal contrast injection. Subsequently, I
acquired the myelogram radiographs. The patient was then transported
to the CT department for CT imaging of the cervical, thoracic and
lumbar spine.
Thoracic spine MRI
11/08/2020. Lumbar spine MRI 11/26/2020. CT abdomen/pelvis
04/14/2021. CT abdomen/pelvis 10/18/2017.
FINDINGS: CERVICAL, THORACIC AND LUMBAR MYELOGRAM FINDINGS:

Satisfactory contrast opacification of the lumbar, thoracic and
cervical thecal sac was achieved.

Nonspecific straightening of the expected cervical lordosis. 2 mm
C3-C4 and C4-C5 grade 1 anterolisthesis. Cervical vertebral body
height is maintained.

Trace T2-T3 and T3-T4 grade 1 anterolisthesis. Thoracic vertebral
body height is maintained.

Straightening of the expected lumbar lordosis. No significant
spondylolisthesis. Lumbar vertebral body height is maintained. A
posterior spinal fusion construct spans the T12-L4 levels. Sequela
of prior fusion at L4-L5 and L5-S1, with interbody devices at these
levels.

Please see additional level by level findings as below.

CT CERVICAL MYELOGRAM FINDINGS:

Unless otherwise stated, the level by level findings below have not
appreciably changed from the prior MRI of 11/08/2020.

Alignment: Cervical levocurvature, possibly positional.
Straightening of the expected cervical lordosis. 2 mm C3-C4 and
C4-C5 grade 1 anterolisthesis.

Skull base and vertebrae: The basion-dental and atlanto-dental
intervals are maintained. No cervical spine fracture or aggressive
osseous lesion.

Soft tissues and spinal canal: Unremarkable.

Disc levels:

Unless otherwise stated, the level by level findings below have not
significantly changed from the prior MRI of 11/08/2020.

Moderate to moderately advanced disc space narrowing at C5-C6. Mild
disc space narrowing at the remaining levels.

C2-C3: Shallow disc bulge. Mild facet arthrosis. No significant
spinal canal stenosis. No significant bony neural foraminal
narrowing.

C3-C4: 2 mm grade 1 anterolisthesis. Slight disc uncovering.
Uncovertebral hypertrophy on the right. Facet arthrosis (greater on
the right). No significant spinal canal stenosis. Mild right bony
neural foraminal narrowing.

C4-C5: 2 mm grade 1 anterolisthesis. Slight disc uncovering with
shallow disc bulge. Facet arthrosis (greater on the left). No
significant spinal canal stenosis or bony neural foraminal
narrowing.

C5-C6: Disc bulge with endplate spurring and right-sided disc
osteophyte ridge/uncinate hypertrophy. Mild uncovertebral
hypertrophy also present on the left. Mild facet arthrosis. The disc
bulge effaces the ventral thecal sac, contacting and minimally
flattening the ventral aspect of the spinal cord. However, the
dorsal CSF space is maintained within the spinal canal. Bilateral
bony neural foraminal narrowing (severe right, mild left).

C6-C7: Posterior disc osteophyte complex eccentric to the left.
Uncovertebral hypertrophy on the left. The disc osteophyte complex
effaces the ventral thecal sac, contacting and minimally flattening
the ventral aspect of the spinal cord. However, the dorsal CSF space
is maintained within the spinal canal. Mild left bony neural
foraminal narrowing.

C7-T1: Minimal facet arthrosis. No significant disc herniation or
spinal canal stenosis. No significant bony neural foraminal
narrowing.

Upper chest: No consolidation within the imaged lung apices.

CT THORACIC MYELOGRAM FINDINGS:

Alignment: Trace T2-T3 and T3-T4 grade 1 anterolisthesis.

Vertebrae: Posterior spinal fusion construct at the T12-L4 levels.
Similar to the prior CT abdomen/pelvis of 04/14/2021, there is mild
lucency surrounding the bilateral T12 screws, which may reflect
hardware loosening (for instance as seen on series 16, image 37).
Indeterminate 6-7 mm round lucent lesion within the T11 spinous
process, similar as compared to the prior CT of 04/14/2021, but new
from the prior CT abdomen/pelvis of 10/18/2017. No thoracic
vertebral compression fracture. Mild multilevel degenerate endplate
irregularity. Fairly prominent multilevel ventrolateral osteophytes.

Paraspinal and other soft tissues: No airspace consolidation within
the imaged lungs. Postsurgical changes to the dorsal soft tissues
overlying the lower thoracic spine.

Disc levels:

Moderate disc space narrowing throughout the thoracic spine.

Shallow multilevel disc bulges. Multilevel facet arthrosis and
ligamentum flavum hypertrophy. Additional notable level by level
findings, as described below.

At T7-T8, a small central disc protrusion eccentric to the right
contacts and mildly flattens the ventral aspect of the spinal cord
(series 14, image 201). However, the dorsal CSF space is maintained
within the spinal canal.

At T9-T10, a disc bulge and endplate spurring resultant
mild-to-moderate spinal canal narrowing (without significant spinal
cord mass effect).

Streak and beam hardening artifact arising from spinal fusion
hardware limits evaluation of the spinal canal at the T12 level.
However, there is a medial course of the left-sided T12 screw, which
encroaches upon the left aspect of the spinal canal, contributing to
apparent moderate spinal canal stenosis and displacing the spinal
cord to the right (for instance as seen on series 5, image 126).

No more than mild relative spinal canal narrowing at the remaining
levels.

Multilevel bony neural foraminal narrowing. Most notably,
degenerative facet spurring contributes to moderate bony neural
foraminal narrowing on the left at T2-T3, bilaterally at T3-T4,
bilaterally at T4-T5, on the left at T5-T6 and on the right at
T7-T8.

There is subtle contour deformity of the posterior aspect of the
spinal cord at the T9-T10 level, corresponding with the site of the
patient's known spinal cord lesion (series 14, image 276). However,
this lesion is overall poorly reassessed by CT myelography.

CT LUMBAR MYELOGRAM FINDINGS:

Segmentation: 5 lumbar vertebrae. The caudal most disc space is
designated L5-S1.

Alignment: Straightening of the expected lumbar lordosis.

Vertebrae: A posterior spinal fusion construct spans the T12-L4
levels. Slightly medial course of the left-sided L1 screw.Prior
fusion at the L4-L5 and L5-S1 levels. Vertebral body height is
maintained. Chronic sacral fracture deformity at the S2-S3 level.

Paraspinal and other soft tissues: Large calcified fibroids.
Aortoiliac atherosclerosis. Left renal calculi measuring up to 5 mm.

Disc levels:

Unless otherwise stated, the level by level findings below have not
significantly changed from the prior MRI of 11/26/2020.

T12-L1: Disc bulge. Superimposed broad-based left foraminal disc
protrusion. Minimal relative left subarticular narrowing. Mild left
neural foraminal narrowing.

At the level of the L1 pedicle, streak and beam hardening artifact
limits evaluation of the spinal canal. However, there is a somewhat
medial course of the left-sided L1 screw, mildly encroaching upon
the left aspect of the spinal canal and contributing to apparent
mild spinal stenosis.

L1-L2: Prior posterior decompression and posterior spinal fusion.
Vertebral osteophytosis and posterior element hypertrophy. No
significant spinal canal stenosis. Moderate right bony neural
foraminal narrowing.

L2-L3: Prior posterior decompression and posterior spinal fusion. No
significant spinal canal stenosis. No significant bony neural
foraminal narrowing.

L3-L4: Prior posterior decompression and posterior spinal fusion. No
significant spinal canal stenosis. Endplate osteophytes and
posterior element hypertrophy contribute to mild right bony neural
foraminal narrowing.

L4-L5: Post laminectomy changes. Prior fusion. No significant spinal
canal stenosis or bony neural foraminal narrowing.

L5-S1: Post laminectomy changes. Prior fusion. No significant spinal
canal stenosis. Posterior element hypertrophy results in mild bony
neural foraminal narrowing bilaterally.
IMPRESSION: Cervical spine:

1. Cervical spondylosis, as outlined and having not appreciably
changed from the prior cervical spine MRI of 11/08/2020. Findings
are most notably as follows.
2. At C5-C6, there is moderate to moderately advanced disc space
narrowing. A disc bulge effaces the ventral thecal sac, contacting
and minimally flattening the ventral aspect of the spinal cord.
However, the dorsal CSF space is maintained within the spinal canal.
Multifactorial bilateral neural foraminal narrowing also present at
this level (severe right, mild left).
3. At C6-C7, there is a posterior disc osteophyte complex eccentric
to the left. The disc osteophyte complex effaces the ventral thecal
sac, contacting and minimally flattening the ventral aspect of the
spinal cord. However, the dorsal CSF space is maintained within the
spinal canal. Multifactorial mild left bony neural foraminal
narrowing also present at this level.
4. Nonspecific straightening of the expected cervical lordosis.
5. Cervical levocurvature appreciated on CT imaging only, possibly
positional.
6. 2 mm C3-C4 and C4-C5 grade 1 anterolisthesis.

Thoracic spine:

1. Posterior spinal fusion construct at the T12-L4 levels. There is
mild lucency surrounding the bilateral T12 screws suggesting
hardware loosening. Additionally, there is a medial course of the
left-sided T12 screw, which encroaches upon the left aspect of the
spinal canal contributing to apparent moderate spinal canal stenosis
and displacing the spinal cord to the right.
2. Subtle contour deformity of the posterior aspect of the spinal
cord at the T9-T10 level, corresponding with the site of the
patient's known spinal cord lesion. However, this lesion is overall
poorly reassessed by CT myelography and would be best re-evaluated
with a contrast-enhanced thoracic spine MRI.
3. Indeterminate 6-7 mm well-circumscribed lucent osseous lesion
within the T11 spinous process. This appears new from the prior CT
abdomen/pelvis of 10/18/2017. A 3-6 month follow-up non-contrast
thoracic spine CT is recommended to ensure stability.
4. Thoracic spondylosis, as outlined and with findings most notably
as follows.
5. At T7-T8, a small central disc protrusion contacts and mildly
flattens the ventral aspect of the spinal cord. However, the dorsal
CSF space is maintained within the spinal canal.
6. At T9-T10, there is multifactorial mild-to-moderate spinal canal
narrowing (without significant spinal cord mass effect).
7. Multifactorial moderate bony neural foraminal narrowing on the
left at T2-T3, bilaterally at T3-T4, bilaterally at T4-T5, on the
left at T5-T6 and on the right at T7-T8.
8. Minimal T2-T3 and T3-T4 grade 1 anterolisthesis.

Lumbar spine:

1. Extensive postoperative changes to the lumbar spine, as
described. There is a slightly medial course of the left L1 pedicle
screw, which mildly encroaches upon the left aspect of the spinal
canal, contributing to apparent mild spinal canal stenosis at this
level.
2. Superimposed lumbar spondylosis. No spinal canal stenosis at the
remaining lumbar levels. Multilevel neural foraminal narrowing,
greatest on the right at L1-L2 (moderate at this site).
3. Chronic sacral fracture deformity at the S2-S3 level.
4. Calcified fibroids.
5. Left nephrolithiasis.
6.  Aortic Atherosclerosis ([id number]).

## 2022-10-09 ENCOUNTER — Ambulatory Visit: Payer: Medicare Other | Admitting: Podiatry

## 2022-10-09 DIAGNOSIS — M79675 Pain in left toe(s): Secondary | ICD-10-CM

## 2022-10-09 DIAGNOSIS — R252 Cramp and spasm: Secondary | ICD-10-CM | POA: Diagnosis not present

## 2022-10-09 DIAGNOSIS — M7662 Achilles tendinitis, left leg: Secondary | ICD-10-CM | POA: Diagnosis not present

## 2022-10-09 NOTE — Progress Notes (Signed)
Subjective:  Patient ID: Michaela Raymond, female    DOB: [birth year],  MRN: [id number]  Chief Complaint  Patient presents with   Toe Pain    67 y.o. female presents with the above complaint.  Patient presents with left foot/big toe cramp.  Patient states that which comes on randomly mostly at nighttime.  She went to get it evaluated she has not seen anyone as prior to seeing me.  She does not have any abnormalities with vitamins.  No pain.  It just hurts cramping and nighttime.  Denies any other acute complaints   Review of Systems: Negative except as noted in the HPI. Denies N/V/F/Ch.  Past Medical History:  Diagnosis Date   Adenoma    of thyroid gland, benign   Anemia    hx. of with grastric bypass surgery   Angina 04/2010   Normal coronary angiogram; noncardiac symptom   Anxiety    Arthritis    osteoarthrits   Asthma    Barrett's esophagus    Bronchitis    Complication of anesthesia    woke up during endoscopy, twlight surgery with knee surgery pt. stopped breathing  and they did her under general   COPD (chronic obstructive pulmonary disease) (HCC)    pt. denies   Depression    Dyspnea    only with heavy exercise   Fibromyalgia    GERD (gastroesophageal reflux disease)    Headache    Heart murmur    years ago   History of kidney stones    has 2 kidney stones now - will have stones remooved in Dec 2023.   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   Osteopenia    Osteoporosis    Paralysis (HCC)    "some, left side worse than right- legs."   Pneumonia    Sleep apnea    cpap machine x 9 years    Current Outpatient Medications:    acetaminophen (TYLENOL) 650 MG CR tablet, Take 1,300 mg by mouth 2 (two) times daily., Disp: , Rfl:    alosetron (LOTRONEX) 0.5 MG tablet, Take 0.5 mg by mouth 2 (two) times daily., Disp: , Rfl:    ALPRAZolam (XANAX) 0.5 MG tablet, TAKE ONE TABLET BY  MOUTH AT BEDTIME (Patient taking differently: Take 0.25 mg by mouth at bedtime.), Disp: 90 tablet, Rfl: 0   Biotin 1000 MCG tablet, Take 1,000 mcg by mouth daily., Disp: , Rfl:    buprenorphine (SUBUTEX) 2 MG SUBL SL tablet, Place 2 mg under the tongue in the morning and at bedtime., Disp: , Rfl:    calcium citrate-vitamin D (CITRACAL+D) 315-200 MG-UNIT tablet, Take 2 tablets by mouth 2 (two) times daily. , Disp: , Rfl:    Cholecalciferol 125 MCG (5000 UT) TABS, Take 5,000 Units by mouth daily., Disp: , Rfl:    denosumab (PROLIA) 60 MG/ML SOSY injection, Inject 60 mg into the skin every 6 (six) months., Disp: , Rfl:    estradiol (ESTRACE) 0.5 MG tablet, Take 0.5 mg by mouth daily., Disp: , Rfl:    ferrous sulfate 325 (65 FE) MG tablet, Take 325 mg by mouth daily with breakfast., Disp: , Rfl:    fluticasone (FLONASE) 50 MCG/ACT nasal spray, Place 2 sprays into both nostrils daily. PATIENT NEEDS OFFICE VISIT FOR ADDITIONAL REFILLS (Patient taking differently: Place 2 sprays into both nostrils 2 (two) times daily.), Disp: 16 g,  Rfl: 0   gabapentin (NEURONTIN) 100 MG capsule, Take 300 mg by mouth in the morning., Disp: , Rfl:    gabapentin (NEURONTIN) 300 MG capsule, Take 600 mg by mouth at bedtime., Disp: , Rfl:    hydrochlorothiazide (MICROZIDE) 12.5 MG capsule, Take 12.5 mg by mouth daily., Disp: , Rfl:    HYDROmorphone (DILAUDID) 2 MG tablet, Take 2 mg by mouth every 6 (six) hours as needed for severe pain., Disp: , Rfl:    hydrOXYzine (ATARAX) 10 MG tablet, Take 10 mg by mouth every 8 (eight) hours as needed for anxiety. (Patient not taking: Reported on 05/25/2022), Disp: , Rfl:    levocetirizine (XYZAL) 5 MG tablet, Take 5 mg by mouth at bedtime. , Disp: , Rfl: 2   levothyroxine (SYNTHROID) 137 MCG tablet, Take 137 mcg by mouth daily before breakfast., Disp: , Rfl:    lidocaine (LIDODERM) 5 %, Place 1-2 patches onto the skin daily. Remove & Discard patch within 12 hours. PATIENT NEEDS OFFICE VISIT  FOR MORE (Patient taking differently: Place 1 patch onto the skin daily as needed (pain). Remove & Discard patch within 12 hours), Disp: 30 patch, Rfl: 0   losartan (COZAAR) 50 MG tablet, Take 25 mg by mouth daily., Disp: , Rfl:    methocarbamol (ROBAXIN) 750 MG tablet, Take 750 mg by mouth every 8 (eight) hours as needed for muscle spasms. (Patient not taking: Reported on 05/25/2022), Disp: , Rfl:    montelukast (SINGULAIR) 10 MG tablet, Take 1 tablet (10 mg total) by mouth at bedtime. PATIENT NEEDS OFFICE VISIT FOR ADDITIONAL REFILLS - 2nd NOTICE, Disp: 30 tablet, Rfl: 0   Multiple Vitamin (MULTIVITAMIN) tablet, Take 1 tablet by mouth daily., Disp: , Rfl:    naproxen sodium (ALEVE) 220 MG tablet, Take 440 mg by mouth 2 (two) times daily as needed (for pain or headache).  (Patient not taking: Reported on 05/25/2022), Disp: , Rfl:    nicotine (NICODERM CQ - DOSED IN MG/24 HOURS) 21 mg/24hr patch, Place 21 mg onto the skin daily as needed (for hospital stay). (Patient not taking: Reported on 05/25/2022), Disp: , Rfl:    oxyCODONE-acetaminophen (PERCOCET) 10-325 MG tablet, Take 1 tablet by mouth every 8 (eight) hours as needed for pain. (Patient not taking: Reported on 05/25/2022), Disp: , Rfl:    pantoprazole (PROTONIX) 20 MG tablet, Take 20 mg by mouth 2 (two) times daily., Disp: , Rfl: 11   polyethylene glycol (MIRALAX / GLYCOLAX) 17 g packet, Take 17 g by mouth daily as needed for mild constipation. (Patient not taking: Reported on 05/25/2022), Disp: , Rfl:    progesterone (PROMETRIUM) 100 MG capsule, Take 100 mg by mouth daily., Disp: , Rfl:    sucralfate (CARAFATE) 1 GM/10ML suspension, Take 1 g by mouth 3 (three) times daily as needed (when taking NSAIDS)., Disp: , Rfl:    tiZANidine (ZANAFLEX) 4 MG tablet, Take 4 mg by mouth at bedtime., Disp: , Rfl:    tiZANidine (ZANAFLEX) 4 MG tablet, Take 1 tablet (4 mg total) by mouth 3 (three) times daily. (Patient not taking: Reported on 05/25/2022), Disp: 90  tablet, Rfl: 3   valACYclovir (VALTREX) 500 MG tablet, Take 500 mg by mouth at bedtime., Disp: , Rfl:   Current Facility-Administered Medications:    methylPREDNISolone acetate (DEPO-MEDROL) injection 80 mg, 80 mg, Other, Once, Tyrell Antonio, MD  Social History   Tobacco Use  Smoking Status Every Day   Packs/day: 1.00   Years: 48.00   Additional pack  years: 0.00   Total pack years: 48.00   Types: Cigarettes   Start date: 02/19/2012  Smokeless Tobacco Never    Allergies  Allergen Reactions   Sulfa Antibiotics Anaphylaxis, Hives, Itching and Swelling   Azithromycin Nausea And Vomiting    Severe Abdominal cramping Other reaction(s): Abdominal Pain Severe abd cramping   Fenofibrate Nausea Only   Tape Hives and Itching    Only use paper or cloth tape   Coreg Other (See Comments)    Patient doesn't remember the reaction   Ibuprofen Other (See Comments)    Weight loss surgery    Verapamil Other (See Comments)    Patient doesn't recall reaction   Asa [Aspirin] Other (See Comments)    GI upset   Escitalopram Oxalate Anxiety and Other (See Comments)    Panic attack, also    Talwin [Pentazocine Lactate] Nausea And Vomiting   Objective:  There were no vitals filed for this visit. There is no height or weight on file to calculate BMI. Constitutional Well developed. Well nourished.  Vascular Dorsalis pedis pulses palpable bilaterally. Posterior tibial pulses palpable bilaterally. Capillary refill normal to all digits.  No cyanosis or clubbing noted. Pedal hair growth normal.  Neurologic Normal speech. Oriented to person, place, and time. Epicritic sensation to light touch grossly present bilaterally.  Dermatologic Nails well groomed and normal in appearance. No open wounds. No skin lesions.  Orthopedic: Unable to elicit a cramping today in clinic.  Excellent range of motion noted of the first metatarsophalangeal joint as well as the interphalangeal joint of the hallux.   Negative extensor or flexor tendinitis noted.  No sesamoidal pain noted   Radiographs: None Assessment:   1. Muscle cramp    Plan:  Patient was evaluated and treated and all questions answered.  Left foot/hallux muscle cramp -All questions and concerns were discussed with the patient in extensive detail.  Given the position of the foot likely at nighttime begin plantarflexed position with tight Achilles tendon I believe patient is experiencing cramping due to that position where the extensor tendons are overcoming the intrinsic muscles.  Therefore I believe patient will benefit from night splint.  Night splint was dispensed  No follow-ups on file.

## 2023-01-14 ENCOUNTER — Encounter: Payer: Medicare Other | Admitting: Podiatry

## 2023-01-16 NOTE — Progress Notes (Signed)
Patient was a no show for today's scheduled appt.

## 2023-07-31 ENCOUNTER — Telehealth: Payer: Self-pay | Admitting: Cardiology

## 2023-07-31 NOTE — Telephone Encounter (Signed)
Patient was returing call. Please advise

## 2023-07-31 NOTE — Telephone Encounter (Signed)
Lvm to call back for an in office appointment

## 2023-07-31 NOTE — Telephone Encounter (Signed)
   Pre-operative Risk Assessment    Patient Name: Michaela Raymond  DOB: [birth year] MRN: [id number]   Date of last office visit: 05/25/2022 Date of next office visit: none   Request for Surgical Clearance    Procedure:   left reverse total shoulder arthroplasty  Date of Surgery:  Clearance TBD                                Surgeon:  Dr. Malon Kindle Surgeon's Group or Practice Name:  [facility] Phone number:  [phone]  [name] Fax number:  [phone]   Type of Clearance Requested:   - Medical    Type of Anesthesia:   choice   Additional requests/questions:    SignedRoyann Shivers   07/31/2023, 9:36 AM

## 2023-07-31 NOTE — Telephone Encounter (Signed)
   Name: Michaela Raymond  DOB: 1955-09-09  MRN: [id number]  Primary Cardiologist: Bryan Lemma, MD  Chart reviewed as part of pre-operative protocol coverage. Because of [name]'s past medical history and time since last visit, she will require a follow-up in-office visit in order to better assess preoperative cardiovascular risk.  Pre-op covering staff: - Please schedule appointment and call patient to inform them. If patient already had an upcoming appointment within acceptable timeframe, please add "pre-op clearance" to the appointment notes so provider is aware. - Please contact requesting surgeon's office via preferred method (i.e, phone, fax) to inform them of need for appointment prior to surgery.   Napoleon Form, Leodis Rains, NP  07/31/2023, 9:43 AM

## 2023-08-01 NOTE — Telephone Encounter (Signed)
I will update all parties involved pt has appt Bernadene Person, NP 09/09/23.

## 2023-08-01 NOTE — Telephone Encounter (Signed)
Patient has been scheduled for in office appt for clearance

## 2023-09-09 ENCOUNTER — Encounter: Payer: Self-pay | Admitting: Nurse Practitioner

## 2023-09-09 ENCOUNTER — Ambulatory Visit: Payer: Medicare (Managed Care) | Attending: Nurse Practitioner | Admitting: Nurse Practitioner

## 2023-09-09 VITALS — BP 104/60 | HR 67 | Ht 63.0 in | Wt 118.0 lb

## 2023-09-09 DIAGNOSIS — Z01810 Encounter for preprocedural cardiovascular examination: Secondary | ICD-10-CM

## 2023-09-09 DIAGNOSIS — I10 Essential (primary) hypertension: Secondary | ICD-10-CM

## 2023-09-09 DIAGNOSIS — I700 Atherosclerosis of aorta: Secondary | ICD-10-CM | POA: Diagnosis not present

## 2023-09-09 DIAGNOSIS — E785 Hyperlipidemia, unspecified: Secondary | ICD-10-CM | POA: Diagnosis not present

## 2023-09-09 DIAGNOSIS — Z720 Tobacco use: Secondary | ICD-10-CM

## 2023-09-09 DIAGNOSIS — R002 Palpitations: Secondary | ICD-10-CM

## 2023-09-09 NOTE — Progress Notes (Signed)
 Office Visit    Patient Name: Michaela Raymond Date of Encounter: 09/09/2023  Primary Care Provider:  Charlane Ferretti, DO Primary Cardiologist:  Bryan Lemma, MD  Chief Complaint    68 year old female with a history of aortic atherosclerosis, hypertension, hyperlipidemia, tobacco use and obesity s/p gastric bypass surgery who presents for follow-up related to hypertension and for preoperative cardiac evaluation.  Past Medical History    Past Medical History:  Diagnosis Date   Adenoma    of thyroid gland, benign   Anemia    hx. of with grastric bypass surgery   Angina 04/2010   Normal coronary angiogram; noncardiac symptom   Anxiety    Arthritis    osteoarthrits   Asthma    Barrett's esophagus    Bronchitis    Complication of anesthesia    woke up during endoscopy, twlight surgery with knee surgery pt. stopped breathing  and they did her under general   COPD (chronic obstructive pulmonary disease) (HCC)    pt. denies   Depression    Dyspnea    only with heavy exercise   Fibromyalgia    GERD (gastroesophageal reflux disease)    Headache    Heart murmur    years ago   History of kidney stones    has 2 kidney stones now - will have stones remooved in Dec 2023.   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   Osteopenia    Osteoporosis    Paralysis (HCC)    "some, left side worse than right- legs."   Pneumonia    Sleep apnea    cpap machine x 9 years   Past Surgical History:  Procedure Laterality Date   BACK SURGERY     spinal fusion   CARDIAC CATHETERIZATION  05/03/2010   No significant CAD in L coronary system, normal LV function   CARDIOVASCULAR STRESS TEST  04/15/2007   EKG negative for ischemia, no significant ischemia demonstrated.   COLONOSCOPY     DILATION AND CURETTAGE OF UTERUS     ESOPHAGOGASTRODUODENOSCOPY     HARDWARE REMOVAL N/A 04/24/2022   Procedure: Removal  of hardware, T12, L1 Area;  Surgeon: Barnett Abu, MD;  Location: [facility];  Service: Neurosurgery;  Laterality: N/A;  RM 18   JOINT REPLACEMENT     KNEE ARTHROPLASTY     bilaterally   LAPAROSCOPIC ROUX-EN-Y GASTRIC BYPASS WITH UPPER ENDOSCOPY AND REMOVAL OF LAP BAND     NASAL SINUS SURGERY     1989 and mucous cele removed   ROTATOR CUFF REPAIR     2001   SHOULDER ARTHROSCOPY WITH OPEN ROTATOR CUFF REPAIR Left 03/07/2018   Procedure: Left shoulder arthroscopy, subacromial decompression, mini open rotator cuff repair,  debridement;  Surgeon: Jene Every, MD;  Location: [facility];  Service: Orthopedics;  Laterality: Left;  90 mins   THYROIDECTOMY     TONSILLECTOMY     and adnoids removed   TRANSTHORACIC ECHOCARDIOGRAM  05/03/2021   EF 55-65%, normal    Allergies  Allergies  Allergen Reactions   Sulfa Antibiotics Anaphylaxis, Hives, Itching and Swelling   Azithromycin Nausea And Vomiting    Severe Abdominal cramping Other reaction(s): Abdominal Pain Severe abd cramping   Fenofibrate Nausea Only   Tape Hives and Itching    Only use paper or cloth tape   Coreg Other (See Comments)    Patient doesn't  remember the reaction   Ibuprofen Other (See Comments)    Weight loss surgery    Verapamil Other (See Comments)    Patient doesn't recall reaction   Asa [Aspirin] Other (See Comments)    GI upset   Escitalopram Oxalate Anxiety and Other (See Comments)    Panic attack, also    Talwin [Pentazocine Lactate] Nausea And Vomiting     Labs/Other Studies Reviewed    The following studies were reviewed today:      Recent Labs: No results found for requested labs within last 365 days.  Recent Lipid Panel    Component Value Date/Time   CHOL  05/03/2010 0450    156        ATP III CLASSIFICATION:  <200     mg/dL   Desirable  161-096  mg/dL   Borderline High  >=045    mg/dL   High          TRIG 409 (H) 05/03/2010 0450   HDL 43 05/03/2010 0450   CHOLHDL 3.6 05/03/2010 0450   VLDL  64 (H) 05/03/2010 0450   LDLCALC  05/03/2010 0450    49        Total Cholesterol/HDL:CHD Risk Coronary Heart Disease Risk Table                     Men   Women  1/2 Average Risk   3.4   3.3  Average Risk       5.0   4.4  2 X Average Risk   9.6   7.1  3 X Average Risk  23.4   11.0        Use the calculated Patient Ratio above and the CHD Risk Table to determine the patient's CHD Risk.        ATP III CLASSIFICATION (LDL):  <100     mg/dL   Optimal  811-914  mg/dL   Near or Above                    Optimal  130-159  mg/dL   Borderline  782-956  mg/dL   High  >213     mg/dL   Very High    History of Present Illness    68 year old female with the above past medical history including aortic atherosclerosis, hypertension, hyperlipidemia, tobacco use and obesity s/p gastric bypass surgery.  She was previously evaluated by cardiology in the setting of chest pain, palpitations.  Cardiac catheterization in 2011 showed no angiographic evidence of significant disease, EF was normal at 65%.  She was last seen in the office on 05/25/2022 and was stable from a cardiac standpoint.  She denied symptoms concerning for angina.  Smoking cessation was advised.  She presents today for follow-up and for preoperative cardiac evaluation for coming left reverse total shoulder arthroplasty with Dr. Patsy Lager of [facility]. Since her last visit she has been stable overall from a cardiac standpoint.  She has been under a good bit of stress recently as she recently downsized from her home of many years to a smaller home.  During this time she did note some intermittent palpitations, no associated symptoms.  Her symptoms have since resolved.  She denies any further palpitations, remains active.  She denies chest pain, dizziness, dyspnea, edema, PND, orthopnea, weight gain.  Overall, she reports feeling well.  Home Medications    Current Outpatient Medications  Medication Sig Dispense Refill   acetaminophen  (TYLENOL) 650 MG  CR tablet Take 1,300 mg by mouth 2 (two) times daily.     alosetron (LOTRONEX) 0.5 MG tablet Take 0.5 mg by mouth 2 (two) times daily.     ALPRAZolam (XANAX) 0.5 MG tablet TAKE ONE TABLET BY MOUTH AT BEDTIME (Patient taking differently: Take 0.25 mg by mouth at bedtime.) 90 tablet 0   Biotin 1000 MCG tablet Take 1,000 mcg by mouth daily.     buprenorphine (SUBUTEX) 2 MG SUBL SL tablet Place 2 mg under the tongue in the morning and at bedtime.     calcium citrate-vitamin D (CITRACAL+D) 315-200 MG-UNIT tablet Take 2 tablets by mouth 2 (two) times daily.      Cholecalciferol 125 MCG (5000 UT) TABS Take 5,000 Units by mouth daily.     denosumab (PROLIA) 60 MG/ML SOSY injection Inject 60 mg into the skin every 6 (six) months.     estradiol (ESTRACE) 0.5 MG tablet Take 0.5 mg by mouth daily.     ferrous sulfate 325 (65 FE) MG tablet Take 325 mg by mouth daily with breakfast.     fluticasone (FLONASE) 50 MCG/ACT nasal spray Place 2 sprays into both nostrils daily. PATIENT NEEDS OFFICE VISIT FOR ADDITIONAL REFILLS (Patient taking differently: Place 2 sprays into both nostrils 2 (two) times daily.) 16 g 0   gabapentin (NEURONTIN) 100 MG capsule Take 300 mg by mouth in the morning.     gabapentin (NEURONTIN) 300 MG capsule Take 600 mg by mouth at bedtime.     hydrochlorothiazide (MICROZIDE) 12.5 MG capsule Take 12.5 mg by mouth daily.     HYDROmorphone (DILAUDID) 2 MG tablet Take 2 mg by mouth every 6 (six) hours as needed for severe pain.     hydrOXYzine (ATARAX) 10 MG tablet Take 10 mg by mouth every 8 (eight) hours as needed for anxiety.     levocetirizine (XYZAL) 5 MG tablet Take 5 mg by mouth at bedtime.   2   levothyroxine (SYNTHROID) 137 MCG tablet Take 137 mcg by mouth daily before breakfast.     lidocaine (LIDODERM) 5 % Place 1-2 patches onto the skin daily. Remove & Discard patch within 12 hours. PATIENT NEEDS OFFICE VISIT FOR MORE (Patient taking differently: Place 1 patch onto  the skin daily as needed (pain). Remove & Discard patch within 12 hours) 30 patch 0   losartan (COZAAR) 50 MG tablet Take 25 mg by mouth daily.     methocarbamol (ROBAXIN) 750 MG tablet Take 750 mg by mouth every 8 (eight) hours as needed for muscle spasms.     montelukast (SINGULAIR) 10 MG tablet Take 1 tablet (10 mg total) by mouth at bedtime. PATIENT NEEDS OFFICE VISIT FOR ADDITIONAL REFILLS - 2nd NOTICE 30 tablet 0   Multiple Vitamin (MULTIVITAMIN) tablet Take 1 tablet by mouth daily.     naproxen sodium (ALEVE) 220 MG tablet Take 440 mg by mouth 2 (two) times daily as needed (for pain or headache).     nicotine (NICODERM CQ - DOSED IN MG/24 HOURS) 21 mg/24hr patch Place 21 mg onto the skin daily as needed (for hospital stay).     oxyCODONE-acetaminophen (PERCOCET) 10-325 MG tablet Take 1 tablet by mouth every 8 (eight) hours as needed for pain.     pantoprazole (PROTONIX) 20 MG tablet Take 20 mg by mouth 2 (two) times daily.  11   polyethylene glycol (MIRALAX / GLYCOLAX) 17 g packet Take 17 g by mouth daily as needed for mild constipation.  progesterone (PROMETRIUM) 100 MG capsule Take 100 mg by mouth daily.     sucralfate (CARAFATE) 1 GM/10ML suspension Take 1 g by mouth 3 (three) times daily as needed (when taking NSAIDS).     tiZANidine (ZANAFLEX) 4 MG tablet Take 4 mg by mouth at bedtime.     valACYclovir (VALTREX) 500 MG tablet Take 500 mg by mouth at bedtime.     Current Facility-Administered Medications  Medication Dose Route Frequency Provider Last Rate Last Admin   methylPREDNISolone acetate (DEPO-MEDROL) injection 80 mg  80 mg Other Once Tyrell Antonio, MD         Review of Systems    She denies chest pain, palpitations, dyspnea, pnd, orthopnea, n, v, dizziness, syncope, edema, weight gain, or early satiety. All other systems reviewed and are otherwise negative except as noted above.   Physical Exam    VS:  BP 104/60   Pulse 67   Ht 5\' 3"  (1.6 m)   Wt 118 lb (53.5 kg)    SpO2 94%   BMI 20.90 kg/m   GEN: Well nourished, well developed, in no acute distress. HEENT: normal. Neck: Supple, no JVD, carotid bruits, or masses. Cardiac: RRR, no murmurs, rubs, or gallops. No clubbing, cyanosis, edema.  Radials/DP/PT 2+ and equal bilaterally.  Respiratory:  Respirations regular and unlabored, clear to auscultation bilaterally. GI: Soft, nontender, nondistended, BS + x 4. MS: no deformity or atrophy. Skin: warm and dry, no rash. Neuro:  Strength and sensation are intact. Psych: Normal affect.  Accessory Clinical Findings    ECG personally reviewed by me today - EKG Interpretation Date/Time:  Monday September 09 2023 15:36:22 EDT Ventricular Rate:  67 PR Interval:  170 QRS Duration:  98 QT Interval:  382 QTC Calculation: 403 R Axis:   -61  Text Interpretation: Normal sinus rhythm Incomplete right bundle branch block Left anterior fascicular block Inferior infarct (cited on or before 24-Apr-2022) Cannot rule out Anterior infarct (cited on or before 24-Apr-2022) When compared with ECG of 24-Apr-2022 11:21, Left anterior fascicular block is now Present Confirmed by Bernadene Person ([id number]) on 09/09/2023 3:43:31 PM  - no acute changes.   Lab Results  Component Value Date   WBC 7.2 04/24/2022   HGB 13.7 04/24/2022   HCT 40.0 04/24/2022   MCV 96.4 04/24/2022   PLT 318 04/24/2022   Lab Results  Component Value Date   CREATININE 0.70 04/24/2022   BUN 15 04/24/2022   NA 138 04/24/2022   K 3.8 04/24/2022   CL 104 04/24/2022   CO2 26 04/24/2022   Lab Results  Component Value Date   ALT 17 10/18/2017   AST 23 10/18/2017   ALKPHOS 72 10/18/2017   BILITOT 0.4 10/18/2017   Lab Results  Component Value Date   CHOL  05/03/2010    156        ATP III CLASSIFICATION:  <200     mg/dL   Desirable  604-540  mg/dL   Borderline High  >=981    mg/dL   High          HDL 43 05/03/2010   LDLCALC  05/03/2010    49        Total Cholesterol/HDL:CHD Risk Coronary Heart  Disease Risk Table                     Men   Women  1/2 Average Risk   3.4   3.3  Average Risk  5.0   4.4  2 X Average Risk   9.6   7.1  3 X Average Risk  23.4   11.0        Use the calculated Patient Ratio above and the CHD Risk Table to determine the patient's CHD Risk.        ATP III CLASSIFICATION (LDL):  <100     mg/dL   Optimal  540-981  mg/dL   Near or Above                    Optimal  130-159  mg/dL   Borderline  191-478  mg/dL   High  >295     mg/dL   Very High   TRIG 621 (H) 05/03/2010   CHOLHDL 3.6 05/03/2010    Lab Results  Component Value Date   HGBA1C (H) 05/02/2010    6.2 (NOTE)                                                                       According to the ADA Clinical Practice Recommendations for 2011, when HbA1c is used as a screening test:   >=6.5%   Diagnostic of Diabetes Mellitus           (if abnormal result  is confirmed)  5.7-6.4%   Increased risk of developing Diabetes Mellitus  References:Diagnosis and Classification of Diabetes Mellitus,Diabetes Care,2011,34(Suppl 1):S62-S69 and Standards of Medical Care in         Diabetes - 2011,Diabetes Care,2011,34  (Suppl 1):S11-S61.    Assessment & Plan   1. Palpitations: Recent intermittent palpitations in the setting of increased stress. Palpitations have since resolved.  Continue to treat reported her symptoms.  No indication for further testing at this time.  2. Aortic atherosclerosis: Noted on prior CT scan.  Catheterization in 2011 revealed no significant coronary artery disease.  Stable with no anginal symptoms.  No indication for ischemic evaluation.  BP well-controlled.  She is no longer on statin therapy.   3. Hypertension: BP well controlled. Continue current antihypertensive regimen.   4. Hyperlipidemia: LDL was 69 in 11/2022.  No longer on statin therapy.  Monitored and managed per PCP.  5. Tobacco use: She continues to smoke, but plans to quit soon.  Her PCP sent her in a prescription  for Chantix this week.  Full cessation advised.  6. Preoperative cardiac evaluation: According to the Revised Cardiac Risk Index (RCRI), her Perioperative Risk of Major Cardiac Event is (%): 0.4. Her Functional Capacity in METs is: 5.62 according to the Duke Activity Status Index (DASI). Therefore, based on ACC/AHA guidelines, patient would be at acceptable risk for the planned procedure without further cardiovascular testing. I will route this recommendation to the requesting party via Epic fax function.  7. Disposition: Follow-up in 1 year.       Joylene Grapes, NP 09/09/2023, 8:08 PM

## 2023-09-09 NOTE — Patient Instructions (Signed)
 Medication Instructions:  Your physician recommends that you continue on your current medications as directed. Please refer to the Current Medication list given to you today.  *If you need a refill on your cardiac medications before your next appointment, please call your pharmacy*   Follow-Up: At [facility], you and your health needs are our priority.  As part of our continuing mission to provide you with exceptional heart care, we have created designated Provider Care Teams.  These Care Teams include your primary Cardiologist (physician) and Advanced Practice Providers (APPs -  Physician Assistants and Nurse Practitioners) who all work together to provide you with the care you need, when you need it.  We recommend signing up for the patient portal called "MyChart".  Sign up information is provided on this After Visit Summary.  MyChart is used to connect with patients for Virtual Visits (Telemedicine).  Patients are able to view lab/test results, encounter notes, upcoming appointments, etc.  Non-urgent messages can be sent to your provider as well.   To learn more about what you can do with MyChart, go to [email or web address].    Your next appointment:   1 year(s)  Provider:   Bryan Lemma, MD     Other Instructions

## 2023-10-28 ENCOUNTER — Telehealth: Payer: Self-pay | Admitting: *Deleted

## 2023-10-28 NOTE — Telephone Encounter (Signed)
   Pre-operative Risk Assessment    Patient Name: Michaela Raymond  DOB: [birth year] MRN: [id number]   Date of last office visit: 09/09/23 Date of next office visit: N/A   Request for Surgical Clearance    Procedure:  LEFT REVERSE TOTAL SHOULDER ARTHRPOLASTY  Date of Surgery:  Clearance TBD                                Surgeon:  DR. Marionette Sick Surgeon's Group or Practice Name:  [facility] Phone number:  [phone] Fax number:  [phone]   Type of Clearance Requested:   - Medical    Type of Anesthesia:  CHOICE   Additional requests/questions:    [name]   10/28/2023, 4:46 PM

## 2023-10-29 NOTE — Telephone Encounter (Signed)
 Preoperative risk assessment was performed with Marlana Silvan, NP on 09/09/2023. Office note was forwarded to requesting office.   Request will be removed from pre-op pool.   Lonell Rives. Riddick Nuon, DNP, NP-C  10/29/2023, 4:16 PM [facility] [address] Office [phone] Fax [phone]
